# Patient Record
Sex: Female | Born: 1935 | Race: White | Hispanic: No | Smoking: Never smoker
Health system: Southern US, Community
[De-identification: ages and names within clinical notes are randomized; demographics above are authoritative.]

## PROBLEM LIST (undated history)

## (undated) DIAGNOSIS — R0602 Shortness of breath: Secondary | ICD-10-CM

## (undated) DIAGNOSIS — I10 Essential (primary) hypertension: Secondary | ICD-10-CM

## (undated) DIAGNOSIS — R599 Enlarged lymph nodes, unspecified: Secondary | ICD-10-CM

## (undated) DIAGNOSIS — D72821 Monocytosis (symptomatic): Secondary | ICD-10-CM

## (undated) DIAGNOSIS — R011 Cardiac murmur, unspecified: Secondary | ICD-10-CM

## (undated) DIAGNOSIS — D696 Thrombocytopenia, unspecified: Secondary | ICD-10-CM

## (undated) DIAGNOSIS — S62102A Fracture of unspecified carpal bone, left wrist, initial encounter for closed fracture: Secondary | ICD-10-CM

## (undated) HISTORY — PX: HEMORRHOID SURGERY: SHX153

## (undated) HISTORY — PX: BUNIONECTOMY: SHX129

## (undated) HISTORY — DX: Fracture of unspecified carpal bone, left wrist, initial encounter for closed fracture: S62.102A

## (undated) HISTORY — DX: Essential (primary) hypertension: I10

## (undated) HISTORY — DX: Monocytosis (symptomatic): D72.821

## (undated) HISTORY — DX: Thrombocytopenia, unspecified: D69.6

## (undated) HISTORY — PX: SOFT TISSUE CYST EXCISION: SHX2418

## (undated) HISTORY — DX: Cardiac murmur, unspecified: R01.1

## (undated) HISTORY — DX: Enlarged lymph nodes, unspecified: R59.9

---

## 2002-04-14 ENCOUNTER — Ambulatory Visit (HOSPITAL_COMMUNITY): Admission: RE | Admit: 2002-04-14 | Discharge: 2002-04-14 | Payer: Self-pay | Admitting: Gastroenterology

## 2002-05-25 ENCOUNTER — Other Ambulatory Visit: Admission: RE | Admit: 2002-05-25 | Discharge: 2002-05-25 | Payer: Self-pay | Admitting: Geriatric Medicine

## 2003-02-05 ENCOUNTER — Encounter: Payer: Self-pay | Admitting: Emergency Medicine

## 2003-02-05 ENCOUNTER — Emergency Department (HOSPITAL_COMMUNITY): Admission: EM | Admit: 2003-02-05 | Discharge: 2003-02-05 | Payer: Self-pay

## 2006-07-08 ENCOUNTER — Encounter: Admission: RE | Admit: 2006-07-08 | Discharge: 2006-07-08 | Payer: Self-pay | Admitting: Orthopedic Surgery

## 2006-07-22 ENCOUNTER — Encounter: Admission: RE | Admit: 2006-07-22 | Discharge: 2006-07-22 | Payer: Self-pay | Admitting: Orthopedic Surgery

## 2007-04-30 ENCOUNTER — Emergency Department (HOSPITAL_COMMUNITY): Admission: EM | Admit: 2007-04-30 | Discharge: 2007-04-30 | Payer: Self-pay | Admitting: Emergency Medicine

## 2007-05-02 ENCOUNTER — Encounter: Admission: RE | Admit: 2007-05-02 | Discharge: 2007-05-02 | Payer: Self-pay | Admitting: Orthopedic Surgery

## 2008-05-28 DIAGNOSIS — D696 Thrombocytopenia, unspecified: Secondary | ICD-10-CM

## 2008-05-28 HISTORY — DX: Thrombocytopenia, unspecified: D69.6

## 2008-11-16 ENCOUNTER — Ambulatory Visit: Payer: Self-pay | Admitting: Oncology

## 2008-12-09 LAB — CBC WITH DIFFERENTIAL/PLATELET
BASO%: 0.7 % (ref 0.0–2.0)
LYMPH%: 28 % (ref 14.0–49.7)
NEUT%: 57 % (ref 38.4–76.8)
RDW: 13.9 % (ref 11.2–14.5)

## 2009-01-13 ENCOUNTER — Ambulatory Visit: Payer: Self-pay | Admitting: Oncology

## 2009-01-17 LAB — CBC WITH DIFFERENTIAL/PLATELET
BASO%: 0.3 % (ref 0.0–2.0)
EOS%: 0.1 % (ref 0.0–7.0)
Eosinophils Absolute: 0 10*3/uL (ref 0.0–0.5)
HCT: 38.6 % (ref 34.8–46.6)
HGB: 12.6 g/dL (ref 11.6–15.9)
MCH: 29 pg (ref 25.1–34.0)
MCHC: 32.6 g/dL (ref 31.5–36.0)
MCV: 88.9 fL (ref 79.5–101.0)
NEUT#: 5.6 10*3/uL (ref 1.5–6.5)
WBC: 7.3 10*3/uL (ref 3.9–10.3)
nRBC: 0 % (ref 0–0)

## 2009-05-05 ENCOUNTER — Ambulatory Visit: Payer: Self-pay | Admitting: Oncology

## 2009-06-02 ENCOUNTER — Ambulatory Visit: Payer: Self-pay | Admitting: Oncology

## 2009-06-10 LAB — CBC WITH DIFFERENTIAL/PLATELET
Basophils Absolute: 0.1 10*3/uL (ref 0.0–0.1)
EOS%: 1.1 % (ref 0.0–7.0)
Eosinophils Absolute: 0.1 10*3/uL (ref 0.0–0.5)
HCT: 39.7 % (ref 34.8–46.6)
LYMPH%: 43 % (ref 14.0–49.7)
MONO%: 14.7 % — ABNORMAL HIGH (ref 0.0–14.0)
NEUT%: 40.1 % (ref 38.4–76.8)

## 2009-10-06 ENCOUNTER — Ambulatory Visit: Payer: Self-pay | Admitting: Oncology

## 2009-10-27 LAB — CBC WITH DIFFERENTIAL/PLATELET
Basophils Absolute: 0 10*3/uL (ref 0.0–0.1)
Eosinophils Absolute: 0.1 10*3/uL (ref 0.0–0.5)
HCT: 36.8 % (ref 34.8–46.6)
HGB: 12.6 g/dL (ref 11.6–15.9)
LYMPH%: 41.9 % (ref 14.0–49.7)
MCH: 30.4 pg (ref 25.1–34.0)
Platelets: 76 10*3/uL — ABNORMAL LOW (ref 145–400)
RBC: 4.15 10*6/uL (ref 3.70–5.45)
WBC: 6.3 10*3/uL (ref 3.9–10.3)

## 2010-02-24 ENCOUNTER — Ambulatory Visit: Payer: Self-pay | Admitting: Oncology

## 2010-02-28 LAB — CBC WITH DIFFERENTIAL/PLATELET
Basophils Absolute: 0.1 10*3/uL (ref 0.0–0.1)
EOS%: 0.5 % (ref 0.0–7.0)
HGB: 12.4 g/dL (ref 11.6–15.9)
MCHC: 32.5 g/dL (ref 31.5–36.0)
MONO%: 12.4 % (ref 0.0–14.0)
Platelets: 70 10*3/uL — ABNORMAL LOW (ref 145–400)
WBC: 6.3 10*3/uL (ref 3.9–10.3)
nRBC: 0 % (ref 0–0)

## 2010-03-10 LAB — FECAL OCCULT BLOOD, GUAIAC: Occult Blood: POSITIVE

## 2010-09-25 ENCOUNTER — Other Ambulatory Visit: Payer: Self-pay | Admitting: Oncology

## 2010-09-25 ENCOUNTER — Encounter (HOSPITAL_BASED_OUTPATIENT_CLINIC_OR_DEPARTMENT_OTHER): Payer: Medicare PPO | Admitting: Oncology

## 2010-09-25 DIAGNOSIS — D72821 Monocytosis (symptomatic): Secondary | ICD-10-CM

## 2010-09-25 DIAGNOSIS — K625 Hemorrhage of anus and rectum: Secondary | ICD-10-CM

## 2010-09-25 DIAGNOSIS — D696 Thrombocytopenia, unspecified: Secondary | ICD-10-CM

## 2010-09-25 DIAGNOSIS — I10 Essential (primary) hypertension: Secondary | ICD-10-CM

## 2010-09-25 LAB — CBC WITH DIFFERENTIAL/PLATELET
EOS%: 1.1 % (ref 0.0–7.0)
HGB: 12.3 g/dL (ref 11.6–15.9)
MONO%: 13.8 % (ref 0.0–14.0)
RBC: 4.23 10*6/uL (ref 3.70–5.45)
WBC: 5.5 10*3/uL (ref 3.9–10.3)

## 2010-10-13 NOTE — Op Note (Signed)
   NAME:  Ashley Lutz, Ashley Lutz                     ACCOUNT NO.:  [id number]   MEDICAL RECORD NO.:  [id number]                   PATIENT TYPE:  AMB   LOCATION:  ENDO                                 FACILITY:  [facility]   PHYSICIAN:  Danise Edge, M.D.                DATE OF BIRTH:  1919-04-29   DATE OF PROCEDURE:  DATE OF DISCHARGE:                                 OPERATIVE REPORT   PROCEDURE:  Screening colonoscopy.   INDICATIONS:  The patient is an 77 year old female born March 25, 1919. In  1992 the patient underwent a colonoscopy performed by Dr. Sheryn Bison  and colon polyps were removed. Her surveillance colonoscopy in 1995 was  normal. The patient is scheduled to undergo her third colonoscopy with  polypectomy to prevent colon cancer.   I discussed the complications associated with colonoscopy and polypectomy,  including a 15 per thousand risk of  bleeding and 1 per thousand risk of  colon perforation. The patient has signed the operative permit.   ENDOSCOPIST:  Danise Edge, M.D.   PREMEDICATION:  Versed 5 mg, fentanyl 50 mcg.   ENDOSCOPE:  Olympus pediatric colonoscope.   DESCRIPTION OF PROCEDURE:  After informed consent was obtained the patient  was placed in the left lateral decubitus position. I administered  intravenous fentanyl and intravenous Versed to achieve conscious sedation  for the procedure. The patient's blood pressure, oxygen saturation and  cardiac rhythm were monitored throughout the procedure and documented in the  medical record.   Anal inspection was normal. Digital rectal exam was normal. The Olympus  pediatric video colonoscope was introduced into the rectum and advanced to  the cecum. Colonic preparation for  the examination today was excellent.   Rectum:  Normal.   Sigmoid colon and descending colon:  Left colonic diverticulosis.   Splenic flexure:  Normal.   Transverse colon:  Normal.   Hepatic flexure:  Normal.   Ascending colon:   Scattered  diverticula.   Cecum and ileocecal valve:  Normal.    ASSESSMENT:  Colonic diverticulosis involving the right colon and left  colon; otherwise normal procto colonoscopy to the cecum. No endoscopic  evidence for the presence of colorectal neoplasia.                                                 Danise Edge, M.D.    MJ/MEDQ  D:  04/14/2002  T:  04/14/2002  Job:  [id number]   cc:   Hal T. Stoneking, M.D.  [address] [place], [place] [address]  Fax: [phone]

## 2010-12-25 ENCOUNTER — Encounter (HOSPITAL_BASED_OUTPATIENT_CLINIC_OR_DEPARTMENT_OTHER): Payer: Medicare PPO | Admitting: Oncology

## 2010-12-25 ENCOUNTER — Other Ambulatory Visit: Payer: Self-pay | Admitting: Oncology

## 2010-12-25 DIAGNOSIS — D696 Thrombocytopenia, unspecified: Secondary | ICD-10-CM

## 2010-12-25 DIAGNOSIS — D72821 Monocytosis (symptomatic): Secondary | ICD-10-CM

## 2010-12-25 DIAGNOSIS — K625 Hemorrhage of anus and rectum: Secondary | ICD-10-CM

## 2010-12-25 LAB — CBC WITH DIFFERENTIAL/PLATELET
Basophils Absolute: 0.1 10*3/uL (ref 0.0–0.1)
EOS%: 1.5 % (ref 0.0–7.0)
Eosinophils Absolute: 0.1 10*3/uL (ref 0.0–0.5)
HCT: 37.2 % (ref 34.8–46.6)
LYMPH%: 39.6 % (ref 14.0–49.7)
MCH: 28.6 pg (ref 25.1–34.0)
MONO#: 0.7 10*3/uL (ref 0.1–0.9)
MONO%: 15.1 % — ABNORMAL HIGH (ref 0.0–14.0)
NEUT%: 42.6 % (ref 38.4–76.8)
Platelets: 49 10*3/uL — ABNORMAL LOW (ref 145–400)
RDW: 15.6 % — ABNORMAL HIGH (ref 11.2–14.5)
lymph#: 1.9 10*3/uL (ref 0.9–3.3)
nRBC: 0 % (ref 0–0)

## 2011-02-19 ENCOUNTER — Other Ambulatory Visit: Payer: Self-pay | Admitting: Oncology

## 2011-02-19 ENCOUNTER — Encounter (HOSPITAL_BASED_OUTPATIENT_CLINIC_OR_DEPARTMENT_OTHER): Payer: Medicare PPO | Admitting: Oncology

## 2011-02-19 DIAGNOSIS — D72821 Monocytosis (symptomatic): Secondary | ICD-10-CM

## 2011-02-19 DIAGNOSIS — K625 Hemorrhage of anus and rectum: Secondary | ICD-10-CM

## 2011-02-19 DIAGNOSIS — D696 Thrombocytopenia, unspecified: Secondary | ICD-10-CM

## 2011-02-19 LAB — CBC WITH DIFFERENTIAL/PLATELET
BASO%: 1.1 % (ref 0.0–2.0)
Basophils Absolute: 0.1 10*3/uL (ref 0.0–0.1)
EOS%: 0.3 % (ref 0.0–7.0)
Eosinophils Absolute: 0 10*3/uL (ref 0.0–0.5)
HGB: 11.8 g/dL (ref 11.6–15.9)
MCH: 28.6 pg (ref 25.1–34.0)
MCV: 88.4 fL (ref 79.5–101.0)
MONO%: 20.3 % — ABNORMAL HIGH (ref 0.0–14.0)
NEUT#: 2.9 10*3/uL (ref 1.5–6.5)
Platelets: 49 10*3/uL — ABNORMAL LOW (ref 145–400)
RDW: 16.1 % — ABNORMAL HIGH (ref 11.2–14.5)

## 2011-02-19 LAB — CHCC SMEAR

## 2011-04-23 ENCOUNTER — Other Ambulatory Visit (HOSPITAL_BASED_OUTPATIENT_CLINIC_OR_DEPARTMENT_OTHER): Payer: Medicare PPO | Admitting: Lab

## 2011-04-23 ENCOUNTER — Other Ambulatory Visit: Payer: Self-pay | Admitting: Oncology

## 2011-04-23 DIAGNOSIS — K625 Hemorrhage of anus and rectum: Secondary | ICD-10-CM

## 2011-04-23 DIAGNOSIS — D696 Thrombocytopenia, unspecified: Secondary | ICD-10-CM

## 2011-04-23 DIAGNOSIS — D72821 Monocytosis (symptomatic): Secondary | ICD-10-CM

## 2011-04-23 LAB — CBC WITH DIFFERENTIAL/PLATELET
BASO%: 0.4 % (ref 0.0–2.0)
Basophils Absolute: 0 10*3/uL (ref 0.0–0.1)
LYMPH%: 28.6 % (ref 14.0–49.7)
MCH: 29.3 pg (ref 25.1–34.0)
MCV: 89.2 fL (ref 79.5–101.0)
NEUT#: 4.2 10*3/uL (ref 1.5–6.5)
Platelets: 55 10*3/uL — ABNORMAL LOW (ref 145–400)
RBC: 4.04 10*6/uL (ref 3.70–5.45)

## 2011-05-24 ENCOUNTER — Telehealth: Payer: Self-pay | Admitting: Oncology

## 2011-05-24 NOTE — Telephone Encounter (Signed)
called pt with 07/02/11 appt,aware per pof from 02/19/11

## 2011-06-26 ENCOUNTER — Other Ambulatory Visit: Payer: Self-pay | Admitting: *Deleted

## 2011-06-26 DIAGNOSIS — D649 Anemia, unspecified: Secondary | ICD-10-CM

## 2011-06-27 ENCOUNTER — Telehealth: Payer: Self-pay | Admitting: Oncology

## 2011-06-27 NOTE — Telephone Encounter (Signed)
called pts home lmovm for appts on 01/31 and 02/04 and to rtn call to confirm appt d/t

## 2011-06-27 NOTE — Telephone Encounter (Signed)
pt called and stated that she does not want to come in on 02/04 but she wants to keep her appt for 02/18

## 2011-06-28 ENCOUNTER — Other Ambulatory Visit: Payer: Medicare PPO | Admitting: Lab

## 2011-07-02 ENCOUNTER — Other Ambulatory Visit: Payer: Medicare PPO | Admitting: Lab

## 2011-07-02 ENCOUNTER — Ambulatory Visit: Payer: Medicare PPO | Admitting: Oncology

## 2011-07-02 ENCOUNTER — Ambulatory Visit: Payer: Medicare PPO | Admitting: Nurse Practitioner

## 2011-07-11 ENCOUNTER — Ambulatory Visit: Payer: Medicare PPO | Admitting: Nurse Practitioner

## 2011-07-13 ENCOUNTER — Encounter: Payer: Self-pay | Admitting: *Deleted

## 2011-07-16 ENCOUNTER — Ambulatory Visit (HOSPITAL_BASED_OUTPATIENT_CLINIC_OR_DEPARTMENT_OTHER): Payer: Medicare Other | Admitting: Oncology

## 2011-07-16 ENCOUNTER — Other Ambulatory Visit: Payer: Medicare Other | Admitting: Lab

## 2011-07-16 VITALS — BP 173/87 | HR 101 | Temp 97.7°F | Ht 61.5 in | Wt 130.2 lb

## 2011-07-16 DIAGNOSIS — D696 Thrombocytopenia, unspecified: Secondary | ICD-10-CM

## 2011-07-16 DIAGNOSIS — D649 Anemia, unspecified: Secondary | ICD-10-CM

## 2011-07-16 LAB — CHCC SMEAR

## 2011-07-16 LAB — CBC WITH DIFFERENTIAL/PLATELET
BASO%: 0.6 % (ref 0.0–2.0)
HCT: 35.3 % (ref 34.8–46.6)
HGB: 11.9 g/dL (ref 11.6–15.9)
LYMPH%: 42.5 % (ref 14.0–49.7)
MCHC: 33.7 g/dL (ref 31.5–36.0)
MCV: 91 fL (ref 79.5–101.0)
NEUT#: 2 10*3/uL (ref 1.5–6.5)
Platelets: 41 10*3/uL — ABNORMAL LOW (ref 145–400)
WBC: 4.5 10*3/uL (ref 3.9–10.3)
lymph#: 1.9 10*3/uL (ref 0.9–3.3)

## 2011-07-16 NOTE — Progress Notes (Signed)
OFFICE PROGRESS NOTE   INTERVAL HISTORY:   She saw Dr. Pete Glatter on 06/18/2011 and the platelet count returned low at 31,000. She is referred for a repeat hematology evaluation. She denies spontaneous bleeding and bruising. She feels well. She denies fever and night sweats.  She recently fell when getting out of her chair at home and developed a "bruise "at the right gluteus and iliac. This has improved.  Objective:  Vital signs in last 24 hours:  Blood pressure 173/87, pulse 101, temperature 97.7 F (36.5 C), temperature source Oral, height 5' 1.5" (1.562 m), weight 130 lb 3.2 oz (59.058 kg).    HEENT: Neck without mass Lymphatics: No cervical, supraclavicular, or inguinal nodes. "Shoddy "bilateral axillary nodes versus prominent fat pads. Resp: Lungs clear bilaterally Cardio: Regular rate and rhythm, 2/6 systolic murmur GI: No hepatosplenomegaly Vascular: No leg edema  Skin: Resolving ecchymosis at the right iliac and upper gluteus.   Lab Results:  Lab Results  Component Value Date   WBC 4.5 07/16/2011   HGB 11.9 07/16/2011   HCT 35.3 07/16/2011   MCV 91.0 07/16/2011   PLT 41* 07/16/2011   ANC 2.0, absolute lymphocyte count 1.9 Absolute monocyte count 0.5   Medications: I have reviewed the patient's current medications.  Assessment/Plan: 1. Thrombocytopenia, question chronic idiopathic thrombocytopenic purpura.  The platelet count was lower at Dr. Laverle Hobby last month and is slightly improved today to. 2. History of small bilateral axillary lymph nodes. 3. Hypertension. 4. G2 P2. 5. History of a chronic mild monocytosis.  The monocyte count is normal today. 6.     Recent fall with a resolving ecchymosis at the right iliac/gluteal region  Disposition:  She has persistent thrombocytopenia. The thrombocytopenia is most likely related to chronic ITP. The platelet count is lower compared to a few years ago. I generally do not recommend treatment of ITP unless the platelet  count falls to less than 25,000 or there is bleeding/need for a surgical procedure. We decided to continue an observation approach for now. I will review the peripheral blood smears from today. She will return for a platelet count in one month. We scheduled a 3 month office visit. She knows to contact us in the interim for spontaneous bleeding or bruising .   Lucile Shutters, MD  07/16/2011  4:11 PM

## 2011-07-17 ENCOUNTER — Telehealth: Payer: Self-pay | Admitting: Oncology

## 2011-07-17 NOTE — Telephone Encounter (Signed)
called pts home lmovm about for march-may2013.  asked pt to call to confirm appt

## 2011-07-18 ENCOUNTER — Telehealth: Payer: Self-pay | Admitting: Oncology

## 2011-07-18 NOTE — Telephone Encounter (Signed)
pt called and r/s lab appt on 03/19 to 03/18

## 2011-08-13 ENCOUNTER — Other Ambulatory Visit (HOSPITAL_BASED_OUTPATIENT_CLINIC_OR_DEPARTMENT_OTHER): Payer: Medicare Other | Admitting: Lab

## 2011-08-13 DIAGNOSIS — D696 Thrombocytopenia, unspecified: Secondary | ICD-10-CM

## 2011-08-13 LAB — CBC WITH DIFFERENTIAL/PLATELET
BASO%: 1.7 % (ref 0.0–2.0)
LYMPH%: 45.9 % (ref 14.0–49.7)
NEUT#: 1.8 10*3/uL (ref 1.5–6.5)
RBC: 4.25 10*6/uL (ref 3.70–5.45)

## 2011-08-14 ENCOUNTER — Other Ambulatory Visit: Payer: Medicare Other | Admitting: Lab

## 2011-08-21 ENCOUNTER — Telehealth: Payer: Self-pay | Admitting: *Deleted

## 2011-08-21 NOTE — Telephone Encounter (Signed)
Patient notified of CBC results. Follow up as scheduled in May. Copy mailed to home as requested.

## 2011-08-21 NOTE — Telephone Encounter (Signed)
Message copied by Wandalee Ferdinand on Tue Aug 21, 2011  5:10 PM ------      Message from: Ashley Lutz      Created: Thu Aug 16, 2011  9:07 PM       Please call patient, platelets are stable, f/u cbc and office  4-6 weeks

## 2011-09-11 ENCOUNTER — Telehealth: Payer: Self-pay | Admitting: *Deleted

## 2011-09-11 NOTE — Telephone Encounter (Signed)
Patient called to request appointment to see Dr. Truett Perna regarding change in her labs. Was instructed to call our office per [facility]. Received CBC results from 4/15 from [facility]--stable except drop in platelets to 25 (was 41 08/13/11). Note attached reports "black stool".

## 2011-09-11 NOTE — Telephone Encounter (Signed)
Attempted to call patient to discuss her work-up with [facility] and how she is feeling. No answer. Forwarded labs to Dr. Truett Perna.

## 2011-09-12 ENCOUNTER — Other Ambulatory Visit: Payer: Self-pay | Admitting: *Deleted

## 2011-09-12 ENCOUNTER — Telehealth: Payer: Self-pay | Admitting: *Deleted

## 2011-09-12 DIAGNOSIS — D696 Thrombocytopenia, unspecified: Secondary | ICD-10-CM

## 2011-09-12 NOTE — Telephone Encounter (Signed)
Called pt, appts given. She verbalized understanding.

## 2011-09-13 ENCOUNTER — Other Ambulatory Visit (HOSPITAL_BASED_OUTPATIENT_CLINIC_OR_DEPARTMENT_OTHER): Payer: Medicare Other | Admitting: Lab

## 2011-09-13 ENCOUNTER — Ambulatory Visit (HOSPITAL_BASED_OUTPATIENT_CLINIC_OR_DEPARTMENT_OTHER): Payer: Medicare Other | Admitting: Nurse Practitioner

## 2011-09-13 VITALS — BP 152/80 | HR 99 | Temp 99.2°F | Ht 61.5 in | Wt 129.1 lb

## 2011-09-13 DIAGNOSIS — D696 Thrombocytopenia, unspecified: Secondary | ICD-10-CM

## 2011-09-13 DIAGNOSIS — D693 Immune thrombocytopenic purpura: Secondary | ICD-10-CM

## 2011-09-13 LAB — CBC WITH DIFFERENTIAL/PLATELET
Basophils Absolute: 0.1 10*3/uL (ref 0.0–0.1)
EOS%: 0.6 % (ref 0.0–7.0)
MCH: 30 pg (ref 25.1–34.0)
MCHC: 32.8 g/dL (ref 31.5–36.0)
MCV: 91.5 fL (ref 79.5–101.0)
MONO%: 10.8 % (ref 0.0–14.0)
NEUT%: 50.9 % (ref 38.4–76.8)
WBC: 7.4 10*3/uL (ref 3.9–10.3)
lymph#: 2.7 10*3/uL (ref 0.9–3.3)
nRBC: 0 % (ref 0–0)

## 2011-09-13 LAB — CHCC SMEAR

## 2011-09-13 NOTE — Progress Notes (Signed)
OFFICE PROGRESS NOTE  Interval history:  [name] is a 78 year old woman with chronic thrombocytopenia. She contacted the office earlier this week to request an appointment due to a decrease in her platelet count at an outside lab. We received a CBC on 09/10/2011 which was stable except the platelet count was 25,000. Last labs done in our office on 08/13/2011 showed a platelet count of 41,000.  [name] reports that she feels well. No interim illnesses or infections. She occasionally notes a very small amount of blood on the toilet tissue after a bowel movement. She attributes the blood to "hemorrhoids". She denies melena. No epistaxis or gingival bleeding. No hematuria.   Objective: Blood pressure 152/80, pulse 99, temperature 99.2 F (37.3 C), temperature source Oral, height 5' 1.5" (1.562 m), weight 129 lb 1.6 oz (58.559 kg).  Oropharynx is without thrush or ulceration. No palpable cervical, supraclavicular or axillary lymph nodes. Lungs are clear. Regular cardiac rhythm. 2-3/6 systolic murmur. Abdomen is soft and nontender. No organomegaly. Extremities without edema.  Lab Results: Lab Results  Component Value Date   WBC 7.4 09/13/2011   HGB 12.1 09/13/2011   HCT 36.8 09/13/2011   MCV 91.5 09/13/2011   PLT 37* 09/13/2011    Chemistry:    Chemistry   No results found for this basename: NA, K, CL, CO2, BUN, CREATININE, GLU   No results found for this basename: CALCIUM, ALKPHOS, AST, ALT, BILITOT       Studies/Results: No results found.  Medications: I have reviewed the patient's current medications.  Assessment/Plan:  1. Thrombocytopenia, question chronic ITP. The platelet count was lower than recent baseline at an outside lab on 09/10/2011. The platelet count today is slightly improved. 2. History of small bilateral axillary lymph nodes. 3. Hypertension. 4. G2 P2. 5. History of a chronic mild monocytosis. The monocyte count is normal today.  Disposition-Ms.  Ashley Lutz has persistent thrombocytopenia. The platelet count in our office today is improved as compared to the recent outside labs. We will continue to follow on an observation approach for now. Dr. Truett Perna will review the peripheral blood smear from today. [name] will return for a followup visit and labs in 6 weeks. She will contact the office in the interim with spontaneous bruising/bleeding.  Plan reviewed with Dr. Truett Perna.   Lonna Cobb ANP/GNP-BC

## 2011-09-18 ENCOUNTER — Telehealth: Payer: Self-pay | Admitting: Oncology

## 2011-09-18 NOTE — Telephone Encounter (Signed)
called pt lmovm that her appt on 05/20 was moved to 05/30

## 2011-10-15 ENCOUNTER — Ambulatory Visit: Payer: Medicare Other | Admitting: Nurse Practitioner

## 2011-10-15 ENCOUNTER — Other Ambulatory Visit: Payer: Medicare Other | Admitting: Lab

## 2011-10-18 DIAGNOSIS — D696 Thrombocytopenia, unspecified: Secondary | ICD-10-CM | POA: Insufficient documentation

## 2011-10-25 ENCOUNTER — Telehealth: Payer: Self-pay | Admitting: Oncology

## 2011-10-25 ENCOUNTER — Ambulatory Visit (HOSPITAL_BASED_OUTPATIENT_CLINIC_OR_DEPARTMENT_OTHER): Payer: Medicare Other | Admitting: Nurse Practitioner

## 2011-10-25 ENCOUNTER — Other Ambulatory Visit (HOSPITAL_BASED_OUTPATIENT_CLINIC_OR_DEPARTMENT_OTHER): Payer: Medicare Other | Admitting: Lab

## 2011-10-25 VITALS — BP 139/77 | HR 91 | Temp 97.7°F | Ht 61.5 in | Wt 130.3 lb

## 2011-10-25 DIAGNOSIS — D693 Immune thrombocytopenic purpura: Secondary | ICD-10-CM

## 2011-10-25 DIAGNOSIS — R011 Cardiac murmur, unspecified: Secondary | ICD-10-CM

## 2011-10-25 DIAGNOSIS — R233 Spontaneous ecchymoses: Secondary | ICD-10-CM

## 2011-10-25 DIAGNOSIS — I10 Essential (primary) hypertension: Secondary | ICD-10-CM

## 2011-10-25 DIAGNOSIS — D696 Thrombocytopenia, unspecified: Secondary | ICD-10-CM

## 2011-10-25 LAB — CBC WITH DIFFERENTIAL/PLATELET
BASO%: 0.9 % (ref 0.0–2.0)
EOS%: 0.9 % (ref 0.0–7.0)
HCT: 36.7 % (ref 34.8–46.6)
LYMPH%: 34.8 % (ref 14.0–49.7)
MONO#: 0.9 10*3/uL (ref 0.1–0.9)
NEUT#: 2.9 10*3/uL (ref 1.5–6.5)
RBC: 4.02 10*6/uL (ref 3.70–5.45)
RDW: 16.5 % — ABNORMAL HIGH (ref 11.2–14.5)
WBC: 5.9 10*3/uL (ref 3.9–10.3)
lymph#: 2.1 10*3/uL (ref 0.9–3.3)
nRBC: 0 % (ref 0–0)

## 2011-10-25 NOTE — Progress Notes (Signed)
OFFICE PROGRESS NOTE  Interval history:  Ashley Lutz returns as scheduled. She overall feels well. No interim illnesses or infections. She denies bleeding except related to "hemorrhoids". She continues to note easy bruising.   Objective: Blood pressure 139/77, pulse 91, temperature 97.7 F (36.5 C), temperature source Oral, height 5' 1.5" (1.562 m), weight 130 lb 4.8 oz (59.104 kg).  Oropharynx is without thrush or ulceration. Lungs clear. Regular cardiac rhythm. 2-3/6 systolic murmur. Abdomen soft and nontender. No splenomegaly. Extremities without edema. Petechiae lower legs.  Lab Results: Lab Results  Component Value Date   WBC 5.9 10/25/2011   HGB 12.1 10/25/2011   HCT 36.7 10/25/2011   MCV 91.3 10/25/2011   PLT 40* 10/25/2011    Chemistry:    Chemistry   No results found for this basename: NA, K, CL, CO2, BUN, CREATININE, GLU   No results found for this basename: CALCIUM, ALKPHOS, AST, ALT, BILITOT       Studies/Results: No results found.  Medications: I have reviewed the patient's current medications.  Assessment/Plan:  1. Thrombocytopenia, question chronic ITP. The platelet count is stable. 2. History of small bilateral axillary lymph nodes. 3. Hypertension. 4. G2 P2. 5. History of a chronic mild monocytosis.   Disposition-Ms. [place] platelet count remains stable. She will return for labs in 6 weeks and a followup visit in 3 months. She will contact the office in the interim with any problems.  Plan reviewed with Dr. Truett Perna.  Lonna Cobb ANP/GNP-BC

## 2011-10-25 NOTE — Telephone Encounter (Signed)
appts made and printed for pt aom °

## 2012-01-24 ENCOUNTER — Other Ambulatory Visit (HOSPITAL_BASED_OUTPATIENT_CLINIC_OR_DEPARTMENT_OTHER): Payer: Medicare Other | Admitting: Lab

## 2012-01-24 ENCOUNTER — Ambulatory Visit (HOSPITAL_BASED_OUTPATIENT_CLINIC_OR_DEPARTMENT_OTHER): Payer: Medicare Other | Admitting: Nurse Practitioner

## 2012-01-24 ENCOUNTER — Telehealth: Payer: Self-pay | Admitting: Oncology

## 2012-01-24 VITALS — BP 140/71 | HR 93 | Temp 98.0°F | Resp 22 | Ht 61.5 in | Wt 130.7 lb

## 2012-01-24 DIAGNOSIS — D696 Thrombocytopenia, unspecified: Secondary | ICD-10-CM

## 2012-01-24 LAB — CBC WITH DIFFERENTIAL/PLATELET
HCT: 36.1 % (ref 34.8–46.6)
MONO#: 0.6 10*3/uL (ref 0.1–0.9)
MONO%: 11.7 % (ref 0.0–14.0)
Platelets: 31 10*3/uL — ABNORMAL LOW (ref 145–400)
RBC: 4.03 10*6/uL (ref 3.70–5.45)
lymph#: 2.1 10*3/uL (ref 0.9–3.3)
nRBC: 0 % (ref 0–0)

## 2012-01-24 LAB — TECHNOLOGIST REVIEW

## 2012-01-24 NOTE — Telephone Encounter (Signed)
gve the pt her oct,dec 2013 appt calendar °

## 2012-01-24 NOTE — Progress Notes (Signed)
OFFICE PROGRESS NOTE  Interval history:  [name] returns as scheduled. No interim illnesses or infections. She denies spontaneous bruising/bleeding. She reports stable dyspnea on exertion. She has an occasional cough. No fever.   Objective: Blood pressure 140/71, pulse 93, temperature 98 F (36.7 C), temperature source Oral, resp. rate 22, height 5' 1.5" (1.562 m), weight 130 lb 11.2 oz (59.285 kg).  Oropharynx is without thrush or ulceration. Lungs are clear. Regular cardiac rhythm. 3 of her 6 systolic murmur. No jugular vein distention. Abdomen soft and nontender. No splenomegaly. Extremities without edema.  Lab Results: Lab Results  Component Value Date   WBC 5.1 01/24/2012   HGB 11.9 01/24/2012   HCT 36.1 01/24/2012   MCV 89.6 01/24/2012   PLT 31* 01/24/2012    Chemistry:    Chemistry   No results found for this basename: NA, K, CL, CO2, BUN, CREATININE, GLU   No results found for this basename: CALCIUM, ALKPHOS, AST, ALT, BILITOT       Studies/Results: No results found.  Medications: I have reviewed the patient's current medications.  Assessment/Plan:  1. Thrombocytopenia, question chronic ITP. The platelet count is stable. 2. History of small bilateral axillary lymph nodes. 3. Hypertension. 4. G2 P2. 5. History of a chronic mild monocytosis.   Disposition-Ashley Lutz's platelet count is slightly lower. She will contact the office with spontaneous bruising/bleeding. She will return for a followup CBC in 8 weeks and a followup visit in 4 months.  Plan reviewed with Dr. Truett Perna.  Lonna Cobb ANP/GNP-BC

## 2012-01-30 ENCOUNTER — Telehealth: Payer: Self-pay | Admitting: *Deleted

## 2012-01-30 NOTE — Telephone Encounter (Signed)
Received call from pt son requesting to speak with RN, wanting pt to be seen.  Attempted to call son [phone]) left voice message on known voice mail that if pt continues to notice spots on arms to go to the ED to get blood checked.  Attempted to call pt home #, no answer/no answering machine.

## 2012-01-30 NOTE — Telephone Encounter (Signed)
Received call from pt stating she had "spots on my right arm and some on my left, they told me to call if I see any"  Pt saw Lonna Cobb, NP 01/24/12, PLTC 31 at that time.  Attempted to call pt and left voice message to return call to this office.

## 2012-01-31 ENCOUNTER — Telehealth: Payer: Self-pay | Admitting: *Deleted

## 2012-01-31 ENCOUNTER — Ambulatory Visit (HOSPITAL_BASED_OUTPATIENT_CLINIC_OR_DEPARTMENT_OTHER): Payer: Medicare Other | Admitting: Lab

## 2012-01-31 DIAGNOSIS — D696 Thrombocytopenia, unspecified: Secondary | ICD-10-CM

## 2012-01-31 LAB — CBC WITH DIFFERENTIAL/PLATELET
BASO%: 1.1 % (ref 0.0–2.0)
EOS%: 1.1 % (ref 0.0–7.0)
HCT: 38.2 % (ref 34.8–46.6)
LYMPH%: 38.8 % (ref 14.0–49.7)
MCH: 30.2 pg (ref 25.1–34.0)
MCHC: 32.9 g/dL (ref 31.5–36.0)
MONO#: 0.7 10*3/uL (ref 0.1–0.9)
NEUT%: 44.8 % (ref 38.4–76.8)
RBC: 4.16 10*6/uL (ref 3.70–5.45)
WBC: 4.9 10*3/uL (ref 3.9–10.3)
lymph#: 1.9 10*3/uL (ref 0.9–3.3)

## 2012-01-31 NOTE — Telephone Encounter (Signed)
Call from pt reporting she has "small red dots" on her Right arm from the elbow to wrist. Asking if she should come in to see Dr. Truett Perna to have this evaluated. Pt states it does not appear to be a rash. No red spots on either leg. Pt denies bleeding. Reviewed with Dr. Truett Perna: order received for CBC today, called pt with instructions to come in for lab, wait for results. She voiced understanding.

## 2012-01-31 NOTE — Telephone Encounter (Signed)
Spoke with pt in lobby, PLT stable. She denies any bleeding. Several small red spots noted on her R forearm. 2 petechiae noted near lab draw site. No other bruising noted. Pt instructed to call office or go to ED if she noticed petechiae type spots all over her body, or if she has bleeding that doesn't stop. She and son voiced understanding. She will follow up as scheduled.

## 2012-02-06 ENCOUNTER — Emergency Department (HOSPITAL_COMMUNITY)
Admission: EM | Admit: 2012-02-06 | Discharge: 2012-02-06 | Disposition: A | Discharge status: Home / Self Care | Payer: Medicare Other | Attending: Emergency Medicine | Admitting: Emergency Medicine

## 2012-02-06 ENCOUNTER — Encounter (HOSPITAL_COMMUNITY): Payer: Self-pay | Admitting: Emergency Medicine

## 2012-02-06 ENCOUNTER — Telehealth: Payer: Self-pay | Admitting: *Deleted

## 2012-02-06 DIAGNOSIS — I10 Essential (primary) hypertension: Secondary | ICD-10-CM | POA: Insufficient documentation

## 2012-02-06 DIAGNOSIS — D696 Thrombocytopenia, unspecified: Secondary | ICD-10-CM

## 2012-02-06 DIAGNOSIS — Z91011 Allergy to milk products: Secondary | ICD-10-CM | POA: Insufficient documentation

## 2012-02-06 LAB — CBC
Hemoglobin: 11.3 g/dL — ABNORMAL LOW (ref 12.0–15.0)
MCHC: 32.9 g/dL (ref 30.0–36.0)
RDW: 16.3 % — ABNORMAL HIGH (ref 11.5–15.5)
WBC: 4 10*3/uL (ref 4.0–10.5)

## 2012-02-06 LAB — HEPATIC FUNCTION PANEL
ALT: 9 U/L (ref 0–35)
Alkaline Phosphatase: 54 U/L (ref 39–117)
Indirect Bilirubin: 0.3 mg/dL (ref 0.3–0.9)
Total Bilirubin: 0.4 mg/dL (ref 0.3–1.2)
Total Protein: 7.4 g/dL (ref 6.0–8.3)

## 2012-02-06 LAB — BASIC METABOLIC PANEL
Chloride: 104 mEq/L (ref 96–112)
GFR calc Af Amer: 87 mL/min — ABNORMAL LOW (ref >90)
GFR calc non Af Amer: 75 mL/min — ABNORMAL LOW (ref >90)
Potassium: 3.3 mEq/L — ABNORMAL LOW (ref 3.5–5.1)
Sodium: 137 mEq/L (ref 135–145)

## 2012-02-06 LAB — PROTIME-INR: INR: 1.14 (ref 0.00–1.49)

## 2012-02-06 NOTE — Telephone Encounter (Signed)
Received call from pt son stating "the spots on mom's arms are getting darker and I'm taking her to the ED and wanted office to be aware."  Platelets 28 on 01/31/12;  Note to Dr. Truett Perna.

## 2012-02-06 NOTE — ED Notes (Signed)
Pt has hx of low platelets and was told to come in and be checked out if she has dark spots on her skin. Pt has a few bruised areas on her arms. Pt states her head feels hot, but denies any other pain. Pt a/o x 4. Pt BIB son.

## 2012-02-06 NOTE — ED Provider Notes (Signed)
History     CSN: [id number]  Arrival date & time 02/06/12  1544   First MD Initiated Contact with Patient 02/06/12 1719      Chief Complaint  Patient presents with  . Bleeding/Bruising    (Consider location/radiation/quality/duration/timing/severity/associated sxs/prior treatment) HPI Comments: Pt comes in with cc of rash. Pt has hx of thrombocytopenia - idiopathic per records, not undergoing any treatments right now. She reports that over the past 2 days she has developed these diffuse spots all over her lower ans upper extremities. There has been no trauma, and no hx of similar lesions in the past. Pt has no recent trauma/falls, and has no nausea, vomiting, visual complains, seizures, altered mental status, loss of consciousness, new weakness, or numbness, no gait instability.  The history is provided by the patient.    Past Medical History  Diagnosis Date  . Thrombocytopenia 2010  . Hypertension   . Heart murmur   . Wrist fracture, left   . Chronic idiopathic monocytosis   . Enlargement of lymph nodes     H/O small bilateral axillary lymph nodes    Past Surgical History  Procedure Date  . Hemorrhoid surgery   . Bunionectomy   . Soft tissue cyst excision     X 5 occasions--cysts on scalp    No family history on file.  History  Substance Use Topics  . Smoking status: Not on file  . Smokeless tobacco: Not on file  . Alcohol Use: Not on file    OB History    Grav Para Term Preterm Abortions TAB SAB Ect Mult Living                  Review of Systems  Constitutional: Positive for activity change.  HENT: Negative for neck pain.   Respiratory: Negative for shortness of breath.   Cardiovascular: Negative for chest pain.  Gastrointestinal: Negative for nausea, vomiting and abdominal pain.  Genitourinary: Negative for dysuria.  Skin: Positive for rash.  Neurological: Negative for headaches.    Allergies  Milk-related compounds  Home Medications    Current Outpatient Rx  Name Route Sig Dispense Refill  . AMLODIPINE BESYLATE 5 MG PO TABS Oral Take 5 mg by mouth daily.    . CENTRUM SILVER PO Oral Take 1 tablet by mouth daily.    Marland Kitchen METAMUCIL PO Oral Take by mouth daily.    Marland Kitchen RAMIPRIL 10 MG PO CAPS Oral Take 10 mg by mouth daily.      BP 147/94  Pulse 84  Temp 97.2 F (36.2 C) (Oral)  Resp 22  SpO2 98%  Physical Exam  Nursing note and vitals reviewed. Constitutional: She is oriented to person, place, and time. She appears well-developed and well-nourished.  HENT:  Head: Normocephalic and atraumatic.  Eyes: EOM are normal. Pupils are equal, round, and reactive to light.  Neck: Neck supple.  Cardiovascular: Normal rate, regular rhythm and normal heart sounds.   No murmur heard. Pulmonary/Chest: Effort normal. No respiratory distress.  Abdominal: Soft. She exhibits no distension. There is no tenderness. There is no rebound and no guarding.  Neurological: She is alert and oriented to person, place, and time.  Skin: Skin is warm and dry.       Pt has diffuse macular lesions - yellow in color and non pruritic, non tender spread all over bilateral lower extremities and upper extremities. The lesions are very fine, and are not pronounced - blend well with rest of her skin.  ED Course  Procedures (including critical care time)  Labs Reviewed  CBC - Abnormal; Notable for the following:    RBC 3.79 (*)     Hemoglobin 11.3 (*)     HCT 34.3 (*)     RDW 16.3 (*)     All other components within normal limits  BASIC METABOLIC PANEL - Abnormal; Notable for the following:    Potassium 3.3 (*)     GFR calc non Af Amer 75 (*)     GFR calc Af Amer 87 (*)     All other components within normal limits  PROTIME-INR  HEPATIC FUNCTION PANEL   No results found.   1. Thrombocytopenia       MDM  Pt comes in with cc of rash. Has significant thrombocytopenia that is known and managed outpatient. The exam demonstrates a rash that  is not a purpura and not a classic petechiae. We will get CBC, LFTs - the latter to make sure there is no liver pathology - which can present as a rash. If labs are normal, will discharge patient.         Derwood Kaplan, MD 02/06/12 2037

## 2012-02-06 NOTE — ED Notes (Signed)
MD at bedside. 

## 2012-02-15 ENCOUNTER — Ambulatory Visit (HOSPITAL_BASED_OUTPATIENT_CLINIC_OR_DEPARTMENT_OTHER): Payer: Medicare Other | Admitting: Lab

## 2012-02-15 ENCOUNTER — Telehealth: Payer: Self-pay | Admitting: *Deleted

## 2012-02-15 ENCOUNTER — Other Ambulatory Visit: Payer: Medicare Other | Admitting: Lab

## 2012-02-15 DIAGNOSIS — D696 Thrombocytopenia, unspecified: Secondary | ICD-10-CM

## 2012-02-15 LAB — CBC WITH DIFFERENTIAL/PLATELET
BASO%: 0.9 % (ref 0.0–2.0)
EOS%: 0.8 % (ref 0.0–7.0)
HCT: 36.9 % (ref 34.8–46.6)
LYMPH%: 40.2 % (ref 14.0–49.7)
MCH: 30.3 pg (ref 25.1–34.0)
MCHC: 32.9 g/dL (ref 31.5–36.0)
NEUT%: 45.2 % (ref 38.4–76.8)
Platelets: 32 10*3/uL — ABNORMAL LOW (ref 145–400)
RBC: 4.02 10*6/uL (ref 3.70–5.45)
WBC: 5.5 10*3/uL (ref 3.9–10.3)
lymph#: 2.2 10*3/uL (ref 0.9–3.3)

## 2012-02-15 NOTE — Telephone Encounter (Signed)
Message from pt requesting appt to have labs checked. She had nose bleed after blowing her nose last night. Reviewed with Dr. Truett Perna: order received for CBC today. Pt understands to wait for RN to review results in lobby.

## 2012-02-21 ENCOUNTER — Telehealth: Payer: Self-pay | Admitting: *Deleted

## 2012-02-21 NOTE — Telephone Encounter (Signed)
Message copied by Gala Romney on Thu Feb 21, 2012  3:24 PM ------      Message from: Ladene Artist      Created: Sat Feb 16, 2012  7:12 PM       Please call patient, platelets are stable, schedule appt. With me or lisa next 2-3 weeks, she has gone to ER and called several times with bruising/bleeding

## 2012-02-21 NOTE — Telephone Encounter (Signed)
Called and spoke with pt son; per Dr. Truett Perna platelets count is stable.  Pt son Ashley Lutz) verbalized understanding and confirmed next appt 05/08/12.

## 2012-03-20 ENCOUNTER — Other Ambulatory Visit (HOSPITAL_BASED_OUTPATIENT_CLINIC_OR_DEPARTMENT_OTHER): Payer: Medicare Other | Admitting: Lab

## 2012-03-20 DIAGNOSIS — D696 Thrombocytopenia, unspecified: Secondary | ICD-10-CM

## 2012-03-20 LAB — CBC WITH DIFFERENTIAL/PLATELET
BASO%: 0.8 % (ref 0.0–2.0)
EOS%: 1.1 % (ref 0.0–7.0)
MCH: 30.3 pg (ref 25.1–34.0)
MCHC: 32.7 g/dL (ref 31.5–36.0)
MONO#: 0.7 10*3/uL (ref 0.1–0.9)
RDW: 16.5 % — ABNORMAL HIGH (ref 11.2–14.5)
WBC: 4.7 10*3/uL (ref 3.9–10.3)
lymph#: 1.8 10*3/uL (ref 0.9–3.3)

## 2012-05-08 ENCOUNTER — Ambulatory Visit (HOSPITAL_BASED_OUTPATIENT_CLINIC_OR_DEPARTMENT_OTHER): Payer: Medicare Other | Admitting: Oncology

## 2012-05-08 ENCOUNTER — Telehealth: Payer: Self-pay | Admitting: Oncology

## 2012-05-08 ENCOUNTER — Other Ambulatory Visit (HOSPITAL_BASED_OUTPATIENT_CLINIC_OR_DEPARTMENT_OTHER): Payer: Medicare Other | Admitting: Lab

## 2012-05-08 VITALS — BP 165/85 | HR 107 | Temp 97.3°F | Resp 20 | Ht 61.5 in | Wt 126.7 lb

## 2012-05-08 DIAGNOSIS — D696 Thrombocytopenia, unspecified: Secondary | ICD-10-CM

## 2012-05-08 DIAGNOSIS — I10 Essential (primary) hypertension: Secondary | ICD-10-CM

## 2012-05-08 LAB — CBC WITH DIFFERENTIAL/PLATELET
Basophils Absolute: 0.1 10*3/uL (ref 0.0–0.1)
EOS%: 0.8 % (ref 0.0–7.0)
Eosinophils Absolute: 0.1 10*3/uL (ref 0.0–0.5)
HGB: 11.5 g/dL — ABNORMAL LOW (ref 11.6–15.9)
MCH: 29.9 pg (ref 25.1–34.0)
MONO#: 1.1 10*3/uL — ABNORMAL HIGH (ref 0.1–0.9)
NEUT#: 3.2 10*3/uL (ref 1.5–6.5)
RDW: 17 % — ABNORMAL HIGH (ref 11.2–14.5)
WBC: 6.4 10*3/uL (ref 3.9–10.3)
lymph#: 1.9 10*3/uL (ref 0.9–3.3)

## 2012-05-08 LAB — TECHNOLOGIST REVIEW

## 2012-05-08 NOTE — Patient Instructions (Signed)
Call for increased bruising or bleeding

## 2012-05-08 NOTE — Telephone Encounter (Signed)
gv and printed appt schedule for pt for Jan 2014  Lab and est

## 2012-05-08 NOTE — Progress Notes (Signed)
   [facility]    OFFICE PROGRESS NOTE   INTERVAL HISTORY:   She returns as scheduled. She feels well. She has exertional dyspnea. There is a bruise at the left upper arm. She she believes her dog may have jumped on her and caused a bruise. No other bleeding.  Objective:  Vital signs in last 24 hours:  Blood pressure 165/85, pulse 107, temperature 97.3 F (36.3 C), temperature source Oral, resp. rate 20, height 5' 1.5" (1.562 m), weight 126 lb 11.2 oz (57.471 kg).    HEENT: Small ecchymosis at the left buccal mucosa and right side of the tongue, no active bleeding Lymphatics: No cervical or supraclavicular nodes.? 1/2 cm mobile bilateral axillary nodes Resp: Lungs clear bilaterally Cardio: Regular rate and rhythm, 2/6 systolic murmur GI: Nontender, no hepatosplenomegaly Vascular: No leg edema, left lower leg is slightly larger than the right side.  Skin: Resolving ecchymosis at the left upper arm and right lower leg     Lab Results:  Lab Results  Component Value Date   WBC 6.4 05/08/2012   HGB 11.5* 05/08/2012   HCT 35.8 05/08/2012   MCV 93.2 05/08/2012   PLT 37 Large & giant platelets* 05/08/2012   ANC 3.2 Monocytes 1.1  Medications: I have reviewed the patient's current medications.  Assessment/Plan: 1. Thrombocytopenia, question chronic ITP. The platelet count is stable. 2. History of small bilateral axillary lymph nodes. 3. Hypertension. 4. G2 P2. 5. History of a chronic mild monocytosis.    Disposition:  She is stable from a hematologic standpoint. The platelet count has not changed significantly over the past 6 months. We discussed continued observation versus a trial of prednisone. She has a few peripheral ecchymoses and mild bruising in the mouth. No other bleeding. I discussed the potential toxicities associated with prednisone. We decided to continue observation for now. Ms. Cabal knows to contact us for spontaneous bleeding or  increased bruising. She will return for an office visit and CBC in approximately 3 weeks.   Thornton Papas, MD  05/08/2012  4:36 PM

## 2012-06-03 ENCOUNTER — Other Ambulatory Visit (HOSPITAL_BASED_OUTPATIENT_CLINIC_OR_DEPARTMENT_OTHER): Payer: Medicare Other | Admitting: Lab

## 2012-06-03 ENCOUNTER — Ambulatory Visit (HOSPITAL_BASED_OUTPATIENT_CLINIC_OR_DEPARTMENT_OTHER): Payer: Medicare Other | Admitting: Nurse Practitioner

## 2012-06-03 ENCOUNTER — Telehealth: Payer: Self-pay | Admitting: Oncology

## 2012-06-03 VITALS — BP 151/79 | HR 98 | Temp 97.0°F | Resp 18 | Ht 61.5 in | Wt 123.3 lb

## 2012-06-03 DIAGNOSIS — D696 Thrombocytopenia, unspecified: Secondary | ICD-10-CM

## 2012-06-03 DIAGNOSIS — Z23 Encounter for immunization: Secondary | ICD-10-CM

## 2012-06-03 LAB — CBC WITH DIFFERENTIAL/PLATELET
BASO%: 0.9 % (ref 0.0–2.0)
HCT: 37.2 % (ref 34.8–46.6)
MCHC: 33.1 g/dL (ref 31.5–36.0)
MONO#: 0.9 10*3/uL (ref 0.1–0.9)
NEUT#: 2.7 10*3/uL (ref 1.5–6.5)
NEUT%: 46.1 % (ref 38.4–76.8)
WBC: 5.8 10*3/uL (ref 3.9–10.3)
lymph#: 2.1 10*3/uL (ref 0.9–3.3)

## 2012-06-03 MED ORDER — INFLUENZA VIRUS VACC SPLIT PF IM SUSP
0.5000 mL | Freq: Once | INTRAMUSCULAR | Status: AC
  Administered 2012-06-03: 0.5 mL via INTRAMUSCULAR
  Filled 2012-06-03: qty 0.5

## 2012-06-03 NOTE — Progress Notes (Signed)
OFFICE PROGRESS NOTE  Interval history:  Ashley Lutz returns as scheduled. She occasionally notes a small amount of blood with wiping after a bowel movement if she "strains". No other bleeding. She continues to note easy bruising. She has stable mild dyspnea on exertion. No fevers or sweats.   Objective: Blood pressure 151/79, pulse 98, temperature 97 F (36.1 C), temperature source Oral, resp. rate 18, height 5' 1.5" (1.562 m), weight 123 lb 4.8 oz (55.929 kg).  Oropharynx is without thrush or ulceration. No palpable cervical, supraclavicular or right axillary lymph nodes. Question small subcentimeter left axillary lymph nodes. Lungs are clear. Regular cardiac rhythm. 3/6 systolic murmur. Abdomen is soft and nontender. No organomegaly. Extremities are without edema. Calves are soft and nontender. Small ecchymosis at the right lower arm.  Lab Results: Lab Results  Component Value Date   WBC 5.8 06/03/2012   HGB 12.3 06/03/2012   HCT 37.2 06/03/2012   MCV 92.5 06/03/2012   PLT 37* 06/03/2012    Chemistry:    Chemistry      Component Value Date/Time   NA 137 02/06/2012 1750   K 3.3* 02/06/2012 1750   CL 104 02/06/2012 1750   CO2 25 02/06/2012 1750   BUN 11 02/06/2012 1750   CREATININE 0.65 02/06/2012 1750      Component Value Date/Time   CALCIUM 9.0 02/06/2012 1750   ALKPHOS 54 02/06/2012 1750   AST 14 02/06/2012 1750   ALT 9 02/06/2012 1750   BILITOT 0.4 02/06/2012 1750       Studies/Results: No results found.  Medications: I have reviewed the patient's current medications.  Assessment/Plan:  1. Thrombocytopenia, question chronic ITP. The platelet count is stable. 2. History of small bilateral axillary lymph nodes. 3. Hypertension. 4. G2 P2. 5. History of a chronic mild monocytosis. Stable.  Disposition-Ms. Crisp remains stable from a hematologic standpoint. We will continue to follow with routine labs. She will return for a followup visit and CBC in 8 weeks. She will contact  the office in the interim with any problems. We specifically discussed bleeding.  Plan reviewed with Dr. Truett Perna.  Lonna Cobb ANP/GNP-BC

## 2012-06-03 NOTE — Telephone Encounter (Signed)
appts made and printed for pt aom °

## 2012-07-31 ENCOUNTER — Telehealth: Payer: Self-pay | Admitting: Oncology

## 2012-07-31 ENCOUNTER — Ambulatory Visit (HOSPITAL_BASED_OUTPATIENT_CLINIC_OR_DEPARTMENT_OTHER): Payer: Medicare Other | Admitting: Oncology

## 2012-07-31 ENCOUNTER — Other Ambulatory Visit (HOSPITAL_BASED_OUTPATIENT_CLINIC_OR_DEPARTMENT_OTHER): Payer: Medicare Other | Admitting: Lab

## 2012-07-31 VITALS — BP 125/77 | HR 93 | Temp 97.5°F | Resp 18 | Ht 61.5 in | Wt 120.9 lb

## 2012-07-31 DIAGNOSIS — D696 Thrombocytopenia, unspecified: Secondary | ICD-10-CM

## 2012-07-31 DIAGNOSIS — D6949 Other primary thrombocytopenia: Secondary | ICD-10-CM

## 2012-07-31 LAB — CBC WITH DIFFERENTIAL/PLATELET
Basophils Absolute: 0.1 10*3/uL (ref 0.0–0.1)
HCT: 36.2 % (ref 34.8–46.6)
HGB: 11.7 g/dL (ref 11.6–15.9)
MCH: 29.9 pg (ref 25.1–34.0)
MONO#: 0.7 10*3/uL (ref 0.1–0.9)
NEUT%: 40 % (ref 38.4–76.8)
WBC: 4.9 10*3/uL (ref 3.9–10.3)
lymph#: 2.1 10*3/uL (ref 0.9–3.3)

## 2012-07-31 NOTE — Progress Notes (Signed)
Patient will be 79 years old this Sunday**

## 2012-07-31 NOTE — Progress Notes (Signed)
   [facility]    OFFICE PROGRESS NOTE   INTERVAL HISTORY:   She returns as scheduled. She has a small bruise at the right forearm and is not sure how this occurred. No other bleeding. No complaint. Good appetite.  Objective:  Vital signs in last 24 hours:  Blood pressure 125/77, pulse 93, temperature 97.5 F (36.4 C), temperature source Oral, resp. rate 18, height 5' 1.5" (1.562 m), weight 120 lb 14.4 oz (54.84 kg).    HEENT: A single 1 mm ecchymosis at the posterior left buccal mucosa, no other ecchymoses or bleeding. Resp: end inspiratory rhonchi at the left posterior base, no respiratory distress Cardio: Regular rate and rhythm, 2/6 systolic murmur GI: Nontender, no hepatosplenomegaly Vascular: No leg edema  Skin: 2 cm ecchymosis at the distal right forearm. No other ecchymoses or petechiae.     Lab Results:  Lab Results  Component Value Date   WBC 4.9 07/31/2012   HGB 11.7 07/31/2012   HCT 36.2 07/31/2012   MCV 92.6 07/31/2012   PLT 31* 07/31/2012   ANC 2.0   Medications: I have reviewed the patient's current medications.  Assessment/Plan: 1. Thrombocytopenia, question chronic ITP. The platelet count is stable. 2. History of small bilateral axillary lymph nodes. 3. Hypertension. 4. G2 P2. 5. History of a chronic mild monocytosis. Stable.  Disposition:  She has chronic thrombocytopenia, most likely related to ITP. We continue an observation approach. Ms. Keltner will contact us for increased bruising or bleeding. She will return for an office visit and CBC in 3 months. The plan is to begin a trial of prednisone for progressive thrombocytopenia or bleeding.   Thornton Papas, MD  07/31/2012  3:54 PM

## 2012-07-31 NOTE — Telephone Encounter (Signed)
Gave pt appt for lab and ML on June 2014

## 2012-08-13 ENCOUNTER — Telehealth: Payer: Self-pay | Admitting: Dietician

## 2012-10-31 ENCOUNTER — Telehealth: Payer: Self-pay | Admitting: Oncology

## 2012-10-31 ENCOUNTER — Other Ambulatory Visit (HOSPITAL_BASED_OUTPATIENT_CLINIC_OR_DEPARTMENT_OTHER): Payer: Medicare Other | Admitting: Lab

## 2012-10-31 ENCOUNTER — Ambulatory Visit (HOSPITAL_BASED_OUTPATIENT_CLINIC_OR_DEPARTMENT_OTHER): Payer: Medicare Other | Admitting: Nurse Practitioner

## 2012-10-31 VITALS — BP 153/74 | HR 90 | Temp 97.5°F | Ht 61.0 in | Wt 116.4 lb

## 2012-10-31 DIAGNOSIS — D696 Thrombocytopenia, unspecified: Secondary | ICD-10-CM

## 2012-10-31 DIAGNOSIS — R634 Abnormal weight loss: Secondary | ICD-10-CM

## 2012-10-31 DIAGNOSIS — I10 Essential (primary) hypertension: Secondary | ICD-10-CM

## 2012-10-31 LAB — CBC WITH DIFFERENTIAL/PLATELET
Eosinophils Absolute: 0.1 10*3/uL (ref 0.0–0.5)
HCT: 34.4 % — ABNORMAL LOW (ref 34.8–46.6)
LYMPH%: 36.6 % (ref 14.0–49.7)
MONO#: 1.2 10*3/uL — ABNORMAL HIGH (ref 0.1–0.9)
NEUT#: 2.9 10*3/uL (ref 1.5–6.5)
NEUT%: 43.6 % (ref 38.4–76.8)
Platelets: 25 10*3/uL — ABNORMAL LOW (ref 145–400)
WBC: 6.7 10*3/uL (ref 3.9–10.3)

## 2012-10-31 MED ORDER — PREDNISONE 20 MG PO TABS: 40.0000 mg | ORAL_TABLET | Freq: Every day | ORAL | Status: DC

## 2012-10-31 NOTE — Telephone Encounter (Signed)
gv adn printed appt sched and avs for pt °

## 2012-10-31 NOTE — Progress Notes (Signed)
OFFICE PROGRESS NOTE  Interval history:  Ashley Lutz returns as scheduled. She continues to note easy bruising. She denies any other bleeding. She has a good appetite. She has noted recent weight loss. She denies any fevers. She has occasional sweats.   Objective: Blood pressure 153/74, pulse 90, temperature 97.5 F (36.4 C), temperature source Oral, height 5\' 1"  (1.549 m), weight 116 lb 6.4 oz (52.799 kg).  Oropharynx is without thrush or ulceration. No bleeding in the oral cavity. No palpable cervical, supraclavicular or axillary lymph nodes. Rhonchi at the left lung base. No respiratory distress. Regular cardiac rhythm. 3/6 systolic murmur. Abdomen is soft and nontender. No organomegaly. Extremities are without edema. 3-4 centimeter ecchymoses at the lower pretibial regions bilaterally.  Lab Results: Lab Results  Component Value Date   WBC 6.7 10/31/2012   HGB 11.3* 10/31/2012   HCT 34.4* 10/31/2012   MCV 92.0 10/31/2012   PLT 25* 10/31/2012    Chemistry:    Chemistry      Component Value Date/Time   NA 137 02/06/2012 1750   K 3.3* 02/06/2012 1750   CL 104 02/06/2012 1750   CO2 25 02/06/2012 1750   BUN 11 02/06/2012 1750   CREATININE 0.65 02/06/2012 1750      Component Value Date/Time   CALCIUM 9.0 02/06/2012 1750   ALKPHOS 54 02/06/2012 1750   AST 14 02/06/2012 1750   ALT 9 02/06/2012 1750   BILITOT 0.4 02/06/2012 1750       Studies/Results: No results found.  Medications: I have reviewed the patient's current medications.  Assessment/Plan:  1. Thrombocytopenia, question chronic ITP. The platelet count is lower. 2. History of small bilateral axillary lymph nodes. No lymph nodes palpated on exam today. 3. Hypertension. 4. G2 P2. 5. History of a chronic mild monocytosis. Stable. 6. Weight loss. Question etiology.  Disposition-Ms. Bosak's platelet count is lower and she has increased bruising. Dr. Truett Perna recommends a trial of prednisone 40 mg daily. We reviewed potential  side effects associated with steroids including emotional lability, weight gain, peripheral edema, steroid psychosis, increased blood sugar, decreased bone density and increased risk of infections. She will return for a repeat platelet count on 11/10/2012. She will return for a followup visit on 11/25/2012. She will contact the office in the interim with any problems.  Patient seen with Dr. Truett Perna.  Ashley Lutz ANP/GNP-BC

## 2012-11-10 ENCOUNTER — Other Ambulatory Visit (HOSPITAL_BASED_OUTPATIENT_CLINIC_OR_DEPARTMENT_OTHER): Payer: Medicare Other | Admitting: Lab

## 2012-11-10 DIAGNOSIS — D696 Thrombocytopenia, unspecified: Secondary | ICD-10-CM

## 2012-11-10 LAB — CBC WITH DIFFERENTIAL/PLATELET
Basophils Absolute: 0.1 10*3/uL (ref 0.0–0.1)
EOS%: 0.1 % (ref 0.0–7.0)
HCT: 38.2 % (ref 34.8–46.6)
HGB: 12.2 g/dL (ref 11.6–15.9)
MCH: 29.9 pg (ref 25.1–34.0)
MCV: 93.6 fL (ref 79.5–101.0)
NEUT%: 78.1 % — ABNORMAL HIGH (ref 38.4–76.8)
lymph#: 3.1 10*3/uL (ref 0.9–3.3)

## 2012-11-11 ENCOUNTER — Telehealth: Payer: Self-pay | Admitting: Nurse Practitioner

## 2012-11-11 DIAGNOSIS — D696 Thrombocytopenia, unspecified: Secondary | ICD-10-CM

## 2012-11-11 NOTE — Telephone Encounter (Signed)
Left message for patient requesting return call.  Need to inform patient to decrease Prednisone to 30mg  daily.  Pt also needs CBC in 10 days.  POF sent to schedulers.

## 2012-11-12 ENCOUNTER — Telehealth: Payer: Self-pay | Admitting: *Deleted

## 2012-11-12 NOTE — Telephone Encounter (Signed)
Notified patient of counts and to decrease prednisone to 30 mg daily ( 1 1/2 tabs). Scheduler will call her for repeat lab in 10 days.

## 2012-11-25 ENCOUNTER — Ambulatory Visit (HOSPITAL_BASED_OUTPATIENT_CLINIC_OR_DEPARTMENT_OTHER): Payer: Medicare Other | Admitting: Nurse Practitioner

## 2012-11-25 ENCOUNTER — Telehealth: Payer: Self-pay | Admitting: Oncology

## 2012-11-25 ENCOUNTER — Other Ambulatory Visit (HOSPITAL_BASED_OUTPATIENT_CLINIC_OR_DEPARTMENT_OTHER): Payer: Medicare Other | Admitting: Lab

## 2012-11-25 VITALS — BP 162/73 | HR 72 | Temp 97.1°F | Resp 18 | Ht 61.0 in | Wt 118.9 lb

## 2012-11-25 DIAGNOSIS — D696 Thrombocytopenia, unspecified: Secondary | ICD-10-CM

## 2012-11-25 LAB — CBC WITH DIFFERENTIAL/PLATELET
BASO%: 0.1 % (ref 0.0–2.0)
Basophils Absolute: 0 10*3/uL (ref 0.0–0.1)
Eosinophils Absolute: 0 10*3/uL (ref 0.0–0.5)
HCT: 37.8 % (ref 34.8–46.6)
HGB: 12.2 g/dL (ref 11.6–15.9)
LYMPH%: 11.4 % — ABNORMAL LOW (ref 14.0–49.7)
MCHC: 32.3 g/dL (ref 31.5–36.0)
MONO#: 1.2 10*3/uL — ABNORMAL HIGH (ref 0.1–0.9)
NEUT%: 80.2 % — ABNORMAL HIGH (ref 38.4–76.8)
Platelets: 35 10*3/uL — ABNORMAL LOW (ref 145–400)
WBC: 14.4 10*3/uL — ABNORMAL HIGH (ref 3.9–10.3)

## 2012-11-25 NOTE — Telephone Encounter (Signed)
Gave pt appt for lab and MD on July 2014 °

## 2012-11-25 NOTE — Progress Notes (Signed)
OFFICE PROGRESS NOTE  Interval history:  Ashley Lutz returns as scheduled. She was started on prednisone 40 mg daily on 10/31/2012 when the platelet count returned at 25,000. Followup platelet count on 11/10/2012 returned at 66,000. The prednisone dose was decreased to 30 mg daily.  She denies bleeding. She continues to note easy bruising. She notes intermittent ankle swelling with prolonged standing. She has a good appetite.   Objective: Blood pressure 162/73, pulse 72, temperature 97.1 F (36.2 C), temperature source Oral, resp. rate 18, height 5\' 1"  (1.549 m), weight 118 lb 14.4 oz (53.933 kg), SpO2 98.00%.  No thrush or ulceration. Lungs clear. Regular cardiac rhythm with occasional premature beat. 3/6 systolic murmur. Abdomen soft and nontender. No organomegaly. No leg edema. Resolving ecchymoses at the lower legs bilaterally. A few small ecchymoses dorsal aspect of the hands and forearms.  Lab Results: Lab Results  Component Value Date   WBC 14.4* 11/25/2012   HGB 12.2 11/25/2012   HCT 37.8 11/25/2012   MCV 94.3 11/25/2012   PLT 35* 11/25/2012    Chemistry:    Chemistry      Component Value Date/Time   NA 137 02/06/2012 1750   K 3.3* 02/06/2012 1750   CL 104 02/06/2012 1750   CO2 25 02/06/2012 1750   BUN 11 02/06/2012 1750   CREATININE 0.65 02/06/2012 1750      Component Value Date/Time   CALCIUM 9.0 02/06/2012 1750   ALKPHOS 54 02/06/2012 1750   AST 14 02/06/2012 1750   ALT 9 02/06/2012 1750   BILITOT 0.4 02/06/2012 1750       Studies/Results: No results found.  Medications: I have reviewed the patient's current medications.  Assessment/Plan:  1. Thrombocytopenia, question chronic ITP. The platelet count returned at 25,000 on 10/31/2012. Prednisone 40 mg daily initiated. Platelet count returned at 66,000 on 11/10/2012.  Prednisone dose decreased to 30 mg daily. 2. History of small bilateral axillary lymph nodes. No lymph nodes noted on exam  10/31/2012. 3. Hypertension. 4. G2 P2. 5. History of a chronic mild monocytosis. Stable. 6. Weight loss. Question etiology. Weight is stable.  Disposition-she will continue prednisone 30 mg daily and return for a CBC in 2 weeks. We will see her in followup on 12/18/2012. She will contact the office in the interim with any problems. We specifically discussed bleeding.  Plan reviewed with Dr. Truett Perna.  Lonna Cobb ANP/GNP-BC

## 2012-12-01 ENCOUNTER — Emergency Department (HOSPITAL_COMMUNITY): Payer: Medicare Other

## 2012-12-01 ENCOUNTER — Emergency Department (HOSPITAL_COMMUNITY)
Admission: EM | Admit: 2012-12-01 | Discharge: 2012-12-01 | Disposition: A | Discharge status: Home / Self Care | Payer: Medicare Other | Attending: Emergency Medicine | Admitting: Emergency Medicine

## 2012-12-01 ENCOUNTER — Telehealth: Payer: Self-pay | Admitting: *Deleted

## 2012-12-01 ENCOUNTER — Encounter (HOSPITAL_COMMUNITY): Payer: Self-pay | Admitting: Emergency Medicine

## 2012-12-01 DIAGNOSIS — R1032 Left lower quadrant pain: Secondary | ICD-10-CM | POA: Insufficient documentation

## 2012-12-01 DIAGNOSIS — K6289 Other specified diseases of anus and rectum: Secondary | ICD-10-CM | POA: Insufficient documentation

## 2012-12-01 DIAGNOSIS — Z79899 Other long term (current) drug therapy: Secondary | ICD-10-CM | POA: Insufficient documentation

## 2012-12-01 DIAGNOSIS — D696 Thrombocytopenia, unspecified: Secondary | ICD-10-CM | POA: Insufficient documentation

## 2012-12-01 DIAGNOSIS — R011 Cardiac murmur, unspecified: Secondary | ICD-10-CM | POA: Insufficient documentation

## 2012-12-01 DIAGNOSIS — Z862 Personal history of diseases of the blood and blood-forming organs and certain disorders involving the immune mechanism: Secondary | ICD-10-CM | POA: Insufficient documentation

## 2012-12-01 DIAGNOSIS — E169 Disorder of pancreatic internal secretion, unspecified: Secondary | ICD-10-CM | POA: Insufficient documentation

## 2012-12-01 DIAGNOSIS — Z9889 Other specified postprocedural states: Secondary | ICD-10-CM | POA: Insufficient documentation

## 2012-12-01 DIAGNOSIS — K5641 Fecal impaction: Secondary | ICD-10-CM

## 2012-12-01 DIAGNOSIS — Z87828 Personal history of other (healed) physical injury and trauma: Secondary | ICD-10-CM | POA: Insufficient documentation

## 2012-12-01 DIAGNOSIS — R339 Retention of urine, unspecified: Secondary | ICD-10-CM

## 2012-12-01 DIAGNOSIS — I10 Essential (primary) hypertension: Secondary | ICD-10-CM | POA: Insufficient documentation

## 2012-12-01 DIAGNOSIS — K869 Disease of pancreas, unspecified: Secondary | ICD-10-CM

## 2012-12-01 LAB — URINE MICROSCOPIC-ADD ON

## 2012-12-01 LAB — CBC WITH DIFFERENTIAL/PLATELET
Basophils Absolute: 0 10*3/uL (ref 0.0–0.1)
Eosinophils Relative: 0 % (ref 0–5)
HCT: 36.6 % (ref 36.0–46.0)
Lymphocytes Relative: 18 % (ref 12–46)
Lymphs Abs: 2.6 10*3/uL (ref 0.7–4.0)
MCV: 94.1 fL (ref 78.0–100.0)
Monocytes Relative: 13 % — ABNORMAL HIGH (ref 3–12)
Neutro Abs: 10.2 10*3/uL — ABNORMAL HIGH (ref 1.7–7.7)
RBC: 3.89 MIL/uL (ref 3.87–5.11)
WBC: 14.7 10*3/uL — ABNORMAL HIGH (ref 4.0–10.5)

## 2012-12-01 LAB — COMPREHENSIVE METABOLIC PANEL
AST: 15 U/L (ref 0–37)
Albumin: 3.5 g/dL (ref 3.5–5.2)
BUN: 20 mg/dL (ref 6–23)
Calcium: 8.7 mg/dL (ref 8.4–10.5)
Chloride: 100 mEq/L (ref 96–112)
Creatinine, Ser: 0.66 mg/dL (ref 0.50–1.10)
Total Bilirubin: 0.8 mg/dL (ref 0.3–1.2)
Total Protein: 7.5 g/dL (ref 6.0–8.3)

## 2012-12-01 LAB — URINALYSIS, ROUTINE W REFLEX MICROSCOPIC
Glucose, UA: NEGATIVE mg/dL
Specific Gravity, Urine: 1.02 (ref 1.005–1.030)
pH: 7.5 (ref 5.0–8.0)

## 2012-12-01 MED ORDER — LOPERAMIDE HCL 2 MG PO CAPS
4.0000 mg | ORAL_CAPSULE | Freq: Once | ORAL | Status: AC
  Administered 2012-12-01: 4 mg via ORAL
  Filled 2012-12-01: qty 2

## 2012-12-01 MED ORDER — CIPROFLOXACIN HCL 500 MG PO TABS
500.0000 mg | ORAL_TABLET | Freq: Once | ORAL | Status: AC
  Administered 2012-12-01: 500 mg via ORAL
  Filled 2012-12-01: qty 1

## 2012-12-01 MED ORDER — IOHEXOL 300 MG/ML  SOLN
50.0000 mL | Freq: Once | INTRAMUSCULAR | Status: AC | PRN
  Administered 2012-12-01: 50 mL via ORAL

## 2012-12-01 MED ORDER — CIPROFLOXACIN HCL 500 MG PO TABS: 500.0000 mg | ORAL_TABLET | Freq: Two times a day (BID) | ORAL | Status: DC

## 2012-12-01 MED ORDER — POTASSIUM CHLORIDE CRYS ER 20 MEQ PO TBCR
40.0000 meq | EXTENDED_RELEASE_TABLET | Freq: Once | ORAL | Status: AC
  Administered 2012-12-01: 40 meq via ORAL
  Filled 2012-12-01: qty 2

## 2012-12-01 MED ORDER — METRONIDAZOLE 500 MG PO TABS: 500.0000 mg | ORAL_TABLET | Freq: Two times a day (BID) | ORAL | Status: DC

## 2012-12-01 MED ORDER — IOHEXOL 300 MG/ML  SOLN
100.0000 mL | Freq: Once | INTRAMUSCULAR | Status: AC | PRN
  Administered 2012-12-01: 100 mL via INTRAVENOUS

## 2012-12-01 MED ORDER — SODIUM CHLORIDE 0.9 % IV BOLUS (SEPSIS)
1000.0000 mL | Freq: Once | INTRAVENOUS | Status: AC
  Administered 2012-12-01: 1000 mL via INTRAVENOUS

## 2012-12-01 MED ORDER — FLEET ENEMA 7-19 GM/118ML RE ENEM
1.0000 | ENEMA | Freq: Once | RECTAL | Status: AC
  Administered 2012-12-01: 05:00:00 via RECTAL
  Filled 2012-12-01: qty 1

## 2012-12-01 MED ORDER — METRONIDAZOLE 500 MG PO TABS
500.0000 mg | ORAL_TABLET | Freq: Once | ORAL | Status: AC
  Administered 2012-12-01: 500 mg via ORAL
  Filled 2012-12-01: qty 1

## 2012-12-01 NOTE — ED Notes (Signed)
Bed:WA20<BR> Expected date:<BR> Expected time:<BR> Means of arrival:<BR> Comments:<BR> EMS

## 2012-12-01 NOTE — ED Notes (Signed)
Rectum inspected no large hemorrhoids noted, pt noted to have brown stool in underwear, liquid brown stool oozing from rectum. Pt c/o pain to the area. Pt cleaned, peri care provided.

## 2012-12-01 NOTE — ED Notes (Signed)
Cath care instruction given to patient and son in complete detail.  Verbalized understanding.

## 2012-12-01 NOTE — Telephone Encounter (Signed)
Left VM for son to give update on what they are doing with her for follow up now regarding the foley cath and potential for future scanning based on CT scan. Made him aware that prednisone was decreased to 30 mg/day on 11/25/12 and will make MD aware of ER visit and labs/scan report to determine what needs to be done regarding her ITP.

## 2012-12-01 NOTE — ED Provider Notes (Addendum)
History    CSN: [id number] Arrival date & time 12/01/12  0104  First MD Initiated Contact with Patient 12/01/12 0134     Chief Complaint  Patient presents with  . Diarrhea   (Consider location/radiation/quality/duration/timing/severity/associated sxs/prior Treatment) The history is provided by the patient.  Ashley Lutz is a 80 y.o. female hx of thrombocytopenia, HTN here with diarrhea. Diarrhea since yesterday. Brown stool and not melena. She also was recently started on prednisone for thrombocytopenia. Also complains of some LLQ pain as well. Denies fever or vomiting or recent antibiotic use.   Past Medical History  Diagnosis Date  . Thrombocytopenia 2010  . Hypertension   . Heart murmur   . Wrist fracture, left   . Chronic idiopathic monocytosis   . Enlargement of lymph nodes     H/O small bilateral axillary lymph nodes   Past Surgical History  Procedure Laterality Date  . Hemorrhoid surgery    . Bunionectomy    . Soft tissue cyst excision      X 5 occasions--cysts on scalp   No family history on file. History  Substance Use Topics  . Smoking status: Never Smoker   . Smokeless tobacco: Not on file  . Alcohol Use: No   OB History   Grav Para Term Preterm Abortions TAB SAB Ect Mult Living                 Review of Systems  Gastrointestinal: Positive for abdominal pain and diarrhea.  All other systems reviewed and are negative.    Allergies  Milk-related compounds  Home Medications   Current Outpatient Rx  Name  Route  Sig  Dispense  Refill  . amLODipine (NORVASC) 5 MG tablet   Oral   Take 5 mg by mouth daily.         . Multiple Vitamins-Minerals (CENTRUM SILVER PO)   Oral   Take 1 tablet by mouth daily.         . predniSONE (DELTASONE) 20 MG tablet   Oral   Take 30 mg by mouth daily.         . Psyllium (METAMUCIL PO)   Oral   Take by mouth daily.         . ramipril (ALTACE) 10 MG capsule   Oral   Take 10 mg by mouth daily.           BP 137/55  Pulse 146  Temp(Src) 98 F (36.7 C) (Oral)  Resp 20  SpO2 88% Physical Exam  Nursing note and vitals reviewed. Constitutional: She is oriented to person, place, and time.  Younger than stated age, slightly uncomfortable   HENT:  Head: Normocephalic.  MM slightly dry   Eyes: Conjunctivae are normal. Pupils are equal, round, and reactive to light.  Neck: Normal range of motion. Neck supple.  Cardiovascular: Normal rate, regular rhythm and normal heart sounds.   Pulmonary/Chest: Effort normal and breath sounds normal. No respiratory distress. She has no wheezes. She has no rales.  Abdominal: Soft.  Mildly distended, + LLQ tenderness, no rebound.   Musculoskeletal: Normal range of motion.  Neurological: She is alert and oriented to person, place, and time.  Skin: Skin is warm and dry.  Psychiatric: She has a normal mood and affect. Her behavior is normal. Judgment and thought content normal.    ED Course  Procedures (including critical care time)  Stool disimpaction I performed digital rectal disimpaction. No complications.   Labs Reviewed  URINALYSIS,  ROUTINE W REFLEX MICROSCOPIC - Abnormal; Notable for the following:    APPearance CLOUDY (*)    Hgb urine dipstick SMALL (*)    Leukocytes, UA SMALL (*)    All other components within normal limits  URINE MICROSCOPIC-ADD ON - Abnormal; Notable for the following:    Bacteria, UA FEW (*)    All other components within normal limits  CBC WITH DIFFERENTIAL - Abnormal; Notable for the following:    WBC 14.7 (*)    Hemoglobin 11.9 (*)    RDW 20.2 (*)    Platelets 25 (*)    Monocytes Relative 13 (*)    Neutro Abs 10.2 (*)    Monocytes Absolute 1.9 (*)    All other components within normal limits  COMPREHENSIVE METABOLIC PANEL - Abnormal; Notable for the following:    Potassium 3.2 (*)    GFR calc non Af Amer 74 (*)    GFR calc Af Amer 86 (*)    All other components within normal limits  URINE CULTURE   CBC WITH DIFFERENTIAL   Ct Abdomen Pelvis W Contrast  12/01/2012   *RADIOLOGY REPORT*  Clinical Data: Left lower quadrant pain.  Excessive bowel movements.  White cell count 14.7.  Red cells and white cells in urine.  CT ABDOMEN AND PELVIS WITH CONTRAST  Technique:  Multidetector CT imaging of the abdomen and pelvis was performed following the standard protocol during bolus administration of intravenous contrast.  Contrast: OMNIPAQUE IOHEXOL 300 MG/ML  SOLN  Comparison: Abdomen 12/01/2012  Findings: Fibrosis and atelectasis in the lung bases. Calcification in the mitral and aortic valves with cardiac enlargement, particularly of the left atrium.  The liver, spleen, gallbladder, adrenal glands, kidneys, inferior vena cava, and retroperitoneal lymph nodes are unremarkable. Calcification of the abdominal aorta without aneurysm.  The pancreas demonstrates a small low attenuation lesion in the junction of the body and tail measuring 9 mm diameter.  There appears to be mild pancreatic ductal dilatation from the tail the pancreas to the lesion. An obstructing intraductal lesion is not excluded.  Consider further evaluation with MRCP.  The stomach and small bowel are decompressed.  Stool filled colon without distension.  No free air or free fluid in the abdomen.  Pelvis:  There is circumferential thickening of the wall of the anorectal junction.  This could be due to inflammatory process or neoplasm.  Direct visualization is recommended to exclude colon cancer.  There is a small amount of free fluid in the pelvis.  The bladder is decompressed with Foley catheter.  No evidence of diverticulitis.  The appendix is not identified.  No significant pelvic lymphadenopathy.  Degenerative changes in the lumbar spine.  IMPRESSION: Proctitis versus colon cancer causing colonic wall thickening of the anorectal junction.  No proximal obstruction.  9 mm low attenuation lesion at the junction of the body and tail the pancreas  with upstream pancreatic ductal dilatation.  Consider MRCP to exclude early pancreatic obstructing lesion.   Original Report Authenticated By: Burman Nieves, M.D.   Dg Abd Acute W/chest  12/01/2012   *RADIOLOGY REPORT*  Clinical Data: Nausea, diarrhea, abdominal pain.  ACUTE ABDOMEN SERIES (ABDOMEN 2 VIEW & CHEST 1 VIEW)  Comparison: Chest 11/05/2011  Findings: Cardiac enlargement with normal pulmonary vascularity. Interstitial fibrosis in the lungs is similar to previous study. No focal airspace consolidation.  No pneumothorax.  Calcification of the aorta.  Scattered gas and stool throughout the colon.  No small or large bowel distension.  No  free intra-abdominal air.  No abnormal air fluid levels.  Thoracolumbar scoliosis and degenerative changes. Vascular calcifications.  No radiopaque stones demonstrated. Calcified phleboliths in the pelvis.  IMPRESSION: Chronic interstitial fibrosis in the lungs.  No evidence of active pulmonary disease.  Nonobstructive bowel gas pattern.   Original Report Authenticated By: Burman Nieves, M.D.   No diagnosis found.   Date: 12/01/2012  Rate: 96  Rhythm: sinus arrhythmia  QRS Axis: normal  Intervals: normal  ST/T Wave abnormalities: normal  Conduction Disutrbances:none  Narrative Interpretation:   Old EKG Reviewed: none available    MDM  Ashley Lutz is a 79 y.o. female here with diarrhea and LLQ pain. Will need to r/o diverticulitis, UTI. Will get labs, UA, CT ab/pel. Will give imodium for symptomatic relief.   4 AM Patient had a lot of rectal pain. Rectal exam showed patient impacted. I disimpacted patient. Will give an enema.   4:30 AM Bladder scan greater than 200. Foley placed with 1 L drainage.   5:41 AM CT showed proctitis from fecal impaction. WBC 15 so will give cipro/flagyl empirically. Platelet count slightly lower. There is incidental lesion on pancreas. Will have her take miralax, follow up with heme/onc, urology, and PMD and get  MRCP outpatient.   Richardean Canal, MD 12/01/12 1610  Richardean Canal, MD 12/01/12 [phone]

## 2012-12-01 NOTE — Telephone Encounter (Signed)
Was in ER last night-could not void and platelet count was low at 25. Was told to call Dr. Truett Perna about that.

## 2012-12-01 NOTE — ED Notes (Signed)
Per EMS pt transported from home c/o excessive bowel movements and rectal pain onset Saturday. A & O, PWD

## 2012-12-01 NOTE — Telephone Encounter (Signed)
Per Dr. Truett Perna : Resume Prednisone dose at 40 mg daily and recheck counts next week as scheduled. Notified son.

## 2012-12-02 LAB — URINE CULTURE

## 2012-12-05 ENCOUNTER — Telehealth: Payer: Self-pay | Admitting: *Deleted

## 2012-12-05 NOTE — Telephone Encounter (Signed)
Late entry for 7/10: Message from [facility] reporting pt saw Dr. Annabell Howells in urology. Catheter was removed. Following up with PCP and Primary MD. Next lab appt in this office 7/15.

## 2012-12-09 ENCOUNTER — Other Ambulatory Visit: Payer: Medicare Other

## 2012-12-10 ENCOUNTER — Other Ambulatory Visit (HOSPITAL_BASED_OUTPATIENT_CLINIC_OR_DEPARTMENT_OTHER): Payer: Medicare Other | Admitting: Lab

## 2012-12-10 DIAGNOSIS — D696 Thrombocytopenia, unspecified: Secondary | ICD-10-CM

## 2012-12-10 LAB — CBC WITH DIFFERENTIAL/PLATELET
Basophils Absolute: 0 10*3/uL (ref 0.0–0.1)
Eosinophils Absolute: 0 10*3/uL (ref 0.0–0.5)
HGB: 10.2 g/dL — ABNORMAL LOW (ref 11.6–15.9)
LYMPH%: 19.4 % (ref 14.0–49.7)
MONO#: 1 10*3/uL — ABNORMAL HIGH (ref 0.1–0.9)
NEUT#: 7.7 10*3/uL — ABNORMAL HIGH (ref 1.5–6.5)
Platelets: 31 10*3/uL — ABNORMAL LOW (ref 145–400)
RBC: 3.37 10*6/uL — ABNORMAL LOW (ref 3.70–5.45)
RDW: 20.3 % — ABNORMAL HIGH (ref 11.2–14.5)
WBC: 10.8 10*3/uL — ABNORMAL HIGH (ref 3.9–10.3)
nRBC: 0 % (ref 0–0)

## 2012-12-10 LAB — TECHNOLOGIST REVIEW

## 2012-12-11 ENCOUNTER — Telehealth: Payer: Self-pay | Admitting: *Deleted

## 2012-12-11 NOTE — Telephone Encounter (Signed)
Message copied by Caleb Popp on Thu Dec 11, 2012  9:46 AM ------      Message from: Thornton Papas B      Created: Wed Dec 10, 2012  8:54 PM       Please call patient, platelets are stable, needs bmbx to evaluate persistent thrombocytopenia and anemia      Schedule bmbx in radiology ------

## 2012-12-11 NOTE — Telephone Encounter (Signed)
Spoke with pt's son, lab results given. Dr. Truett Perna recommends bone marrow biopsy. Ashley Lutz voiced understanding. Knows to expect call from schedulers.

## 2012-12-18 ENCOUNTER — Telehealth: Payer: Self-pay | Admitting: Oncology

## 2012-12-18 ENCOUNTER — Ambulatory Visit (HOSPITAL_BASED_OUTPATIENT_CLINIC_OR_DEPARTMENT_OTHER): Payer: Medicare Other | Admitting: Oncology

## 2012-12-18 ENCOUNTER — Other Ambulatory Visit (HOSPITAL_BASED_OUTPATIENT_CLINIC_OR_DEPARTMENT_OTHER): Payer: Medicare Other | Admitting: Lab

## 2012-12-18 VITALS — BP 154/83 | HR 88 | Temp 97.8°F | Resp 18 | Ht 61.0 in | Wt 120.6 lb

## 2012-12-18 DIAGNOSIS — D696 Thrombocytopenia, unspecified: Secondary | ICD-10-CM

## 2012-12-18 LAB — CBC WITH DIFFERENTIAL/PLATELET
Basophils Absolute: 0 10*3/uL (ref 0.0–0.1)
Eosinophils Absolute: 0 10*3/uL (ref 0.0–0.5)
HCT: 34.7 % — ABNORMAL LOW (ref 34.8–46.6)
HGB: 11 g/dL — ABNORMAL LOW (ref 11.6–15.9)
NEUT#: 8.4 10*3/uL — ABNORMAL HIGH (ref 1.5–6.5)
RDW: 20.8 % — ABNORMAL HIGH (ref 11.2–14.5)
lymph#: 1.3 10*3/uL (ref 0.9–3.3)

## 2012-12-18 LAB — TECHNOLOGIST REVIEW

## 2012-12-18 NOTE — Progress Notes (Signed)
   [facility]    OFFICE PROGRESS NOTE   INTERVAL HISTORY:   She returns as scheduled. She continues prednisone. No bleeding. She was seen in the emergency room on 12/01/2012 with a fecal impaction and urinary retention. She was evaluated by urology. A Foley catheter has been removed. She reports no difficulty with bowel or bladder control at present.  The platelet count was lower and she had developed more anemia last week. We ordered a bone marrow biopsy. This has not been scheduled.  Objective:  Vital signs in last 24 hours:  Blood pressure 154/83, pulse 88, temperature 97.8 F (36.6 C), temperature source Oral, resp. rate 18, height 5\' 1"  (1.549 m), weight 120 lb 9.6 oz (54.704 kg).    HEENT: No thrush or bleeding Lymphatics: No cervical, supraclavicular, or axillary nodes Resp: Lungs clear bilaterally Cardio: Regular rate and rhythm GI: No hepatosplenomegaly Vascular: No leg edema  Skin: Ecchymoses over the legs     Lab Results:  Lab Results  Component Value Date   WBC 10.1 12/18/2012   HGB 11.0* 12/18/2012   HCT 34.7* 12/18/2012   MCV 95.3 12/18/2012   PLT 39* 12/18/2012   ANC 8.4    Medications: I have reviewed the patient's current medications.  Assessment/Plan: 1. Thrombocytopenia, question chronic ITP. The platelet count returned at 25,000 on 10/31/2012. Prednisone 40 mg daily initiated. Platelet count returned at 66,000 on 11/10/2012. The platelet count has declined despite continuation of prednisone over the past month. 2. History of small bilateral axillary lymph nodes. No lymph nodes noted on exam today 3. Hypertension. 4. G2 P2. 5. History of a chronic mild monocytosis. Stable. 6. Weight loss. Question etiology. Weight is stable. 7. Evaluation emergency room with a fecal impaction and urinary retention 12/01/2012 8. CT abdomen 12/01/2012 revealed a 9 mm low-attenuation lesion at the junction of the body and tail of the pancreas with the  upstream pancreatic ductal dilatation  Disposition:  The platelet count has improved minimally while on prednisone over the past month. She has anemia. I am concerned she has an underlying hematologic disorder such as myelodysplasia. We will refer her for a diagnostic bone marrow biopsy during the week of 12/22/2012. She will continue prednisone at a dose of 30 mg daily. Ms. Ainley will return for an office visit in 2 weeks.   Thornton Papas, MD  12/18/2012  5:20 PM

## 2012-12-18 NOTE — Telephone Encounter (Signed)
gv and printed appt sched and avs for pt  °

## 2012-12-19 ENCOUNTER — Other Ambulatory Visit: Payer: Self-pay | Admitting: Radiology

## 2012-12-24 ENCOUNTER — Ambulatory Visit (HOSPITAL_COMMUNITY): Admission: RE | Admit: 2012-12-24 | Payer: Medicare Other | Source: Ambulatory Visit

## 2012-12-25 ENCOUNTER — Other Ambulatory Visit: Payer: Self-pay | Admitting: *Deleted

## 2012-12-25 ENCOUNTER — Telehealth: Payer: Self-pay | Admitting: *Deleted

## 2012-12-25 NOTE — Telephone Encounter (Signed)
Per radiology fax, bone marrow rescheduled from 7/29 tp 01/05/13 at 1100.

## 2012-12-25 NOTE — Telephone Encounter (Signed)
Left VM at patient's and son's voice mail that OV for 8/7 will be cancelled and rescheduled for after 8/11 so bone marrow biopsy results will be available. POF to scheduler for change.

## 2012-12-26 ENCOUNTER — Inpatient Hospital Stay (HOSPITAL_COMMUNITY): Admission: RE | Admit: 2012-12-26 | Payer: Medicare Other | Source: Ambulatory Visit

## 2012-12-26 ENCOUNTER — Other Ambulatory Visit (HOSPITAL_COMMUNITY): Payer: Medicare Other

## 2012-12-29 ENCOUNTER — Telehealth: Payer: Self-pay | Admitting: Oncology

## 2012-12-31 ENCOUNTER — Encounter (HOSPITAL_COMMUNITY): Payer: Self-pay | Admitting: Pharmacy Technician

## 2013-01-01 ENCOUNTER — Ambulatory Visit: Payer: Medicare Other | Admitting: Nurse Practitioner

## 2013-01-01 ENCOUNTER — Telehealth: Payer: Self-pay | Admitting: *Deleted

## 2013-01-01 NOTE — Telephone Encounter (Signed)
Left VM reporting the only medications patient is taking at home is BP med, steroid and MVI. Was told our office had requested this information.

## 2013-01-02 ENCOUNTER — Other Ambulatory Visit: Payer: Self-pay | Admitting: Radiology

## 2013-01-05 ENCOUNTER — Encounter (HOSPITAL_COMMUNITY): Payer: Self-pay

## 2013-01-05 ENCOUNTER — Ambulatory Visit (HOSPITAL_COMMUNITY)
Admission: RE | Admit: 2013-01-05 | Discharge: 2013-01-05 | Disposition: A | Discharge status: Home / Self Care | Payer: Medicare Other | Source: Ambulatory Visit | Attending: Oncology | Admitting: Oncology

## 2013-01-05 DIAGNOSIS — R0602 Shortness of breath: Secondary | ICD-10-CM | POA: Insufficient documentation

## 2013-01-05 DIAGNOSIS — IMO0002 Reserved for concepts with insufficient information to code with codable children: Secondary | ICD-10-CM | POA: Insufficient documentation

## 2013-01-05 DIAGNOSIS — D7289 Other specified disorders of white blood cells: Secondary | ICD-10-CM | POA: Insufficient documentation

## 2013-01-05 DIAGNOSIS — R011 Cardiac murmur, unspecified: Secondary | ICD-10-CM | POA: Insufficient documentation

## 2013-01-05 DIAGNOSIS — D649 Anemia, unspecified: Secondary | ICD-10-CM | POA: Insufficient documentation

## 2013-01-05 DIAGNOSIS — R609 Edema, unspecified: Secondary | ICD-10-CM | POA: Insufficient documentation

## 2013-01-05 DIAGNOSIS — I10 Essential (primary) hypertension: Secondary | ICD-10-CM | POA: Insufficient documentation

## 2013-01-05 DIAGNOSIS — D7589 Other specified diseases of blood and blood-forming organs: Secondary | ICD-10-CM | POA: Insufficient documentation

## 2013-01-05 DIAGNOSIS — D696 Thrombocytopenia, unspecified: Secondary | ICD-10-CM

## 2013-01-05 HISTORY — DX: Shortness of breath: R06.02

## 2013-01-05 LAB — APTT: aPTT: 27 seconds (ref 24–37)

## 2013-01-05 LAB — PROTIME-INR
INR: 1.03 (ref 0.00–1.49)
Prothrombin Time: 13.3 seconds (ref 11.6–15.2)

## 2013-01-05 LAB — CBC
Hemoglobin: 11.5 g/dL — ABNORMAL LOW (ref 12.0–15.0)
RBC: 3.64 MIL/uL — ABNORMAL LOW (ref 3.87–5.11)
WBC: 12.6 10*3/uL — ABNORMAL HIGH (ref 4.0–10.5)

## 2013-01-05 MED ORDER — FENTANYL CITRATE 0.05 MG/ML IJ SOLN
INTRAMUSCULAR | Status: AC | PRN
  Administered 2013-01-05: 50 ug via INTRAVENOUS

## 2013-01-05 MED ORDER — FENTANYL CITRATE 0.05 MG/ML IJ SOLN
INTRAMUSCULAR | Status: AC
  Filled 2013-01-05: qty 6

## 2013-01-05 MED ORDER — HYDROCODONE-ACETAMINOPHEN 5-325 MG PO TABS: 1.0000 | ORAL_TABLET | ORAL | Status: DC | PRN

## 2013-01-05 MED ORDER — MIDAZOLAM HCL 2 MG/2ML IJ SOLN
INTRAMUSCULAR | Status: AC | PRN
  Administered 2013-01-05: 1 mg via INTRAVENOUS

## 2013-01-05 MED ORDER — SODIUM CHLORIDE 0.9 % IV SOLN
Freq: Once | INTRAVENOUS | Status: AC
  Administered 2013-01-05: 09:00:00 via INTRAVENOUS

## 2013-01-05 MED ORDER — MIDAZOLAM HCL 2 MG/2ML IJ SOLN
INTRAMUSCULAR | Status: AC
  Filled 2013-01-05: qty 6

## 2013-01-05 NOTE — H&P (Signed)
Ashley Lutz is an 79 y.o. female.   Chief Complaint: "I'm getting a bone marrow biopsy" HPI: Patient with history of anemia and thrombocytopenia presents today for CT guided bone marrow biopsy.  Past Medical History  Diagnosis Date  . Thrombocytopenia 2010  . Hypertension   . Heart murmur   . Wrist fracture, left   . Chronic idiopathic monocytosis   . Enlargement of lymph nodes     H/O small bilateral axillary lymph nodes  . Shortness of breath     with excertion    Past Surgical History  Procedure Laterality Date  . Hemorrhoid surgery    . Bunionectomy    . Soft tissue cyst excision      X 5 occasions--cysts on scalp    History reviewed. No pertinent family history. Social History:  reports that she has never smoked. She does not have any smokeless tobacco history on file. She reports that she does not drink alcohol or use illicit drugs.  Allergies:  Allergies  Allergen Reactions  . Milk-Related Compounds Other (See Comments)    GI intolerance    Current outpatient prescriptions:amLODipine (NORVASC) 5 MG tablet, Take 5 mg by mouth daily., Disp: , Rfl: ;  Multiple Vitamins-Minerals (CENTRUM SILVER PO), Take 1 tablet by mouth daily., Disp: , Rfl: ;  predniSONE (DELTASONE) 20 MG tablet, Take 40 mg by mouth daily., Disp: , Rfl: ;  ramipril (ALTACE) 10 MG capsule, Take 10 mg by mouth daily., Disp: , Rfl: ;  Psyllium (METAMUCIL PO), Take by mouth daily., Disp: , Rfl:  Current facility-administered medications:fentaNYL (SUBLIMAZE) 0.05 MG/ML injection, , , , ;  midazolam (VERSED) 2 MG/2ML injection, , , ,    Results for orders placed during the hospital encounter of 01/05/13 (from the past 48 hour(s))  APTT     Status: None   Collection Time    01/05/13  9:00 AM      Result Value Range   aPTT 27  24 - 37 seconds  CBC     Status: Abnormal   Collection Time    01/05/13  9:00 AM      Result Value Range   WBC 12.6 (*) 4.0 - 10.5 K/uL   RBC 3.64 (*) 3.87 - 5.11 MIL/uL   Hemoglobin 11.5 (*) 12.0 - 15.0 g/dL   HCT 98.1 (*) 19.1 - 47.8 %   MCV 97.3  78.0 - 100.0 fL   MCH 31.6  26.0 - 34.0 pg   MCHC 32.5  30.0 - 36.0 g/dL   RDW 29.5 (*) 62.1 - 30.8 %   Platelets 32 (*) 150 - 400 K/uL   Comment: SPECIMEN CHECKED FOR CLOTS     REPEATED TO VERIFY     PLATELET COUNT CONFIRMED BY SMEAR  PROTIME-INR     Status: None   Collection Time    01/05/13  9:00 AM      Result Value Range   Prothrombin Time 13.3  11.6 - 15.2 seconds   INR 1.03  0.00 - 1.49   No results found.  Review of Systems  Constitutional: Negative for fever and chills.  HENT: Positive for hearing loss.   Respiratory: Positive for shortness of breath. Negative for cough and hemoptysis.   Cardiovascular: Negative for chest pain.  Gastrointestinal: Negative for nausea, vomiting and abdominal pain.       Occ blood in stool noted  Genitourinary: Negative for dysuria.  Musculoskeletal: Negative for back pain.  Neurological: Negative for headaches.   Vitals:  BP 154/91  HR 95  R 22  O2 SATS 97% RA TEMP 98.8 Physical Exam  Constitutional: She is oriented to person, place, and time. She appears well-developed and well-nourished.  Cardiovascular:  Murmur heard. irreg  Respiratory: Effort normal and breath sounds normal.  GI: Soft. Bowel sounds are normal. There is no tenderness.  Musculoskeletal: Normal range of motion. She exhibits edema.  Neurological: She is alert and oriented to person, place, and time.     Assessment/Plan Pt with hx anemia/thrombocytopenia. Plan is for CT guided bone marrow biopsy today. Details/risks of procedure d/w pt/son with their understanding and consent.  ALLRED,D KEVIN 01/05/2013, 10:17 AM

## 2013-01-05 NOTE — Procedures (Signed)
CT guided bone marrow aspirate and biopsy.  No immediate complication. 

## 2013-01-06 ENCOUNTER — Encounter: Payer: Self-pay | Admitting: Oncology

## 2013-01-06 NOTE — Progress Notes (Incomplete)
Patient

## 2013-01-09 ENCOUNTER — Ambulatory Visit (HOSPITAL_BASED_OUTPATIENT_CLINIC_OR_DEPARTMENT_OTHER): Payer: Medicare Other | Admitting: Nurse Practitioner

## 2013-01-09 ENCOUNTER — Telehealth: Payer: Self-pay | Admitting: Oncology

## 2013-01-09 ENCOUNTER — Other Ambulatory Visit: Payer: Self-pay | Admitting: Nurse Practitioner

## 2013-01-09 VITALS — BP 126/83 | HR 84 | Temp 98.1°F | Resp 18 | Ht 61.0 in | Wt 118.8 lb

## 2013-01-09 DIAGNOSIS — I10 Essential (primary) hypertension: Secondary | ICD-10-CM

## 2013-01-09 DIAGNOSIS — D696 Thrombocytopenia, unspecified: Secondary | ICD-10-CM

## 2013-01-09 DIAGNOSIS — D72821 Monocytosis (symptomatic): Secondary | ICD-10-CM

## 2013-01-09 NOTE — Progress Notes (Addendum)
OFFICE PROGRESS NOTE  Interval history:  Ashley Lutz returns as scheduled. She underwent a bone marrow in interventional radiology on 01/05/2013 with findings of a hypercellular marrow with dysmegakaryopoiesis and myeloid hyperplasia. Pathologist commented the findings are suggestive of a myelodysplastic syndrome, specifically refractory thrombocytopenia. Cytogenetic analysis is pending.  She occasionally notes blood with bowel movements. She bruises easily. Bowels moving regularly. Current prednisone dose is 40 mg daily.   Objective: Blood pressure 126/83, pulse 84, temperature 98.1 F (36.7 C), temperature source Oral, resp. rate 18, height 5\' 1"  (1.549 m), weight 118 lb 12.8 oz (53.887 kg).  Lungs are clear. Regular cardiac rhythm. Abdomen soft. No organomegaly. Trace lower leg edema bilaterally. Calves are nontender. Ecchymosis surrounding the bone marrow site right posterior iliac region. Large ecchymosis and several smaller ecchymoses at the right forearm.  Lab Results: Lab Results  Component Value Date   WBC 12.6* 01/05/2013   HGB 11.5* 01/05/2013   HCT 35.4* 01/05/2013   MCV 97.3 01/05/2013   PLT 32* 01/05/2013    Chemistry:    Chemistry      Component Value Date/Time   NA 136 12/01/2012 0355   K 3.2* 12/01/2012 0355   CL 100 12/01/2012 0355   CO2 25 12/01/2012 0355   BUN 20 12/01/2012 0355   CREATININE 0.66 12/01/2012 0355      Component Value Date/Time   CALCIUM 8.7 12/01/2012 0355   ALKPHOS 54 12/01/2012 0355   AST 15 12/01/2012 0355   ALT 12 12/01/2012 0355   BILITOT 0.8 12/01/2012 0355       Studies/Results: Ct Biopsy  01/05/2013   *RADIOLOGY REPORT*  Clinical history:  79 year old with anemia and thrombocytopenia.  PROCEDURE(S): CT GUIDED BONE MARROW ASPIRATES AND BIOPSY  Physician: Rachelle Hora. Henn, MD   Medications: Versed 1 mg, Fentanyl 50 mcg. A radiology nurse monitored the patient for moderate sedation.   Procedure: The procedure was explained to the patient. The risks and  benefits of the procedure were discussed and the patient's questions were addressed.  Informed consent was obtained from the patient. The patient was placed prone on CT scan. Images of the pelvis were obtained. The right side of back was prepped and draped in sterile fashion. The skin and right posterior iliac bone were anesthetized with 1% lidocaine.   Bone needle was directed into the right iliac bone with CT guidance. Two aspirates and one core biopsy obtained with a bone drill.   Findings: Needle placement confirmed within the posterior right iliac bone.  Complications: None   Impression: CT guided bone marrow aspirates and core biopsy.   Original Report Authenticated By: Richarda Overlie, M.D.    Medications: I have reviewed the patient's current medications.  Assessment/Plan:  1. Thrombocytopenia, question chronic ITP. The platelet count returned at 25,000 on 10/31/2012. Prednisone 40 mg daily initiated. Platelet count returned at 66,000 on 11/10/2012. The platelet count has declined despite continuation of prednisone over the past month. 2. History of small bilateral axillary lymph nodes.  3. Hypertension. 4. G2 P2. 5. History of a chronic mild monocytosis. Stable. 6. Weight loss. Question etiology. Weight is stable. 7. Evaluation emergency room with a fecal impaction and urinary retention 12/01/2012 8. CT abdomen 12/01/2012 revealed a 9 mm low-attenuation lesion at the junction of the body and tail of the pancreas with upstream pancreatic ductal dilatation.  Disposition-bone marrow findings are consistent with myelodysplastic syndrome, refractory thrombocytopenia. Cytogenetic analysis is pending.  Dr. Truett Perna recommends discontinuation of prednisone. We provided Ashley Lutz  and her son with a taper schedule.  We will contact her once the cytogenetic analysis is available.  She will return in 2 weeks for labs and in 6 weeks for a followup visit. She and her son understand to contact the  office immediately with any bleeding.  Patient seen with Dr. Truett Perna.   Lonna Cobb ANP/GNP-BC   This was a shared visit with Lonna Cobb  The bone marrow biopsy reveals histologic evidence of myelodysplasia. She does not appear to have ITP based on the bone marrow findings and lack of a response to prednisone. The prednisone will be tapered to off.  I discussed the bone marrow findings with Ashley Lutz and her son. Treatment options are limited if she has myelodysplasia. We are waiting on the bone marrow cytogenetics.  She will be followed with observation for now. She knows to contact us for spontaneous bleeding. Ashley Lutz will return for an office visit in 6 weeks.  Mancel Bale, M.D.

## 2013-01-09 NOTE — Telephone Encounter (Signed)
Gave pt appt for lab and MD on 9/30

## 2013-01-09 NOTE — Patient Instructions (Signed)
Decrease prednisone to 20mg  daily for 5 days, then 10mg  daily for 5 days, then 5mg  daily for 5 days then STOP

## 2013-01-16 ENCOUNTER — Telehealth: Payer: Self-pay | Admitting: Medical Oncology

## 2013-01-16 NOTE — Telephone Encounter (Signed)
I called pt's son Iantha Fallen to inform him that the cytogenetics from the bone marrow 01/05/13 is normal. Dr. Truett Perna is going to continue to keep her under observation and to keep her appointments as scheduled.

## 2013-02-24 ENCOUNTER — Telehealth: Payer: Self-pay | Admitting: Oncology

## 2013-02-24 ENCOUNTER — Ambulatory Visit (HOSPITAL_BASED_OUTPATIENT_CLINIC_OR_DEPARTMENT_OTHER): Payer: Medicare Other | Admitting: Oncology

## 2013-02-24 ENCOUNTER — Other Ambulatory Visit (HOSPITAL_BASED_OUTPATIENT_CLINIC_OR_DEPARTMENT_OTHER): Payer: Medicare Other | Admitting: Lab

## 2013-02-24 VITALS — BP 140/73 | HR 94 | Temp 97.5°F | Resp 18 | Ht 61.0 in | Wt 121.2 lb

## 2013-02-24 DIAGNOSIS — D649 Anemia, unspecified: Secondary | ICD-10-CM

## 2013-02-24 DIAGNOSIS — D709 Neutropenia, unspecified: Secondary | ICD-10-CM

## 2013-02-24 DIAGNOSIS — D696 Thrombocytopenia, unspecified: Secondary | ICD-10-CM

## 2013-02-24 LAB — CBC WITH DIFFERENTIAL/PLATELET
Basophils Absolute: 0 10*3/uL (ref 0.0–0.1)
EOS%: 0.8 % (ref 0.0–7.0)
Eosinophils Absolute: 0 10*3/uL (ref 0.0–0.5)
HCT: 35.5 % (ref 34.8–46.6)
HGB: 11.1 g/dL — ABNORMAL LOW (ref 11.6–15.9)
MCH: 31.2 pg (ref 25.1–34.0)
MCV: 99.7 fL (ref 79.5–101.0)
MONO%: 17.2 % — ABNORMAL HIGH (ref 0.0–14.0)
NEUT#: 1.1 10*3/uL — ABNORMAL LOW (ref 1.5–6.5)
NEUT%: 27.6 % — ABNORMAL LOW (ref 38.4–76.8)
Platelets: 40 10*3/uL — ABNORMAL LOW (ref 145–400)

## 2013-02-24 NOTE — Progress Notes (Signed)
   [facility]    OFFICE PROGRESS NOTE   INTERVAL HISTORY:   She returns as scheduled. She cut one of her toenails earlier today and developed mild bleeding. No other bleeding.  Objective:  Vital signs in last 24 hours:  Blood pressure 140/73, pulse 94, temperature 97.5 F (36.4 C), temperature source Oral, resp. rate 18, height 5\' 1"  (1.549 m), weight 121 lb 3.2 oz (54.976 kg).    HEENT: Small ecchymoses at the left side of the tongue. No active bleeding. No petechia Resp: Lungs clear bilaterally Cardio: Regular rate and rhythm, 2/6 systolic murmur GI: No hepatosplenomegaly Vascular: No leg edema  Skin: Few ecchymoses over the extremities. Minimal bleeding at the right third toe nail.    Lab Results:  Lab Results  Component Value Date   WBC 3.9 02/24/2013   HGB 11.1* 02/24/2013   HCT 35.5 02/24/2013   MCV 99.7 02/24/2013   PLT 40 Large platelets present* 02/24/2013   ANC 1.1   Medications: I have reviewed the patient's current medications.  Assessment/Plan:  1. Thrombocytopenia, question chronic ITP. The platelet count returned at 25,000 on 10/31/2012. Prednisone 40 mg daily initiated. Platelet count returned at 66,000 on 11/10/2012. The platelet count declined despite continuation of prednisone .  Bone marrow biopsy 01/05/2013 revealed a hypercellular marrow with dysplastic changes of the megakaryocytes and myeloid hyperplasia, normal cytogenetics 2. History of small bilateral axillary lymph nodes.  3. Hypertension. 4. G2 P2. 5. History of a chronic mild monocytosis.  6. Weight loss. Question etiology. Weight is stable. 7. Evaluation emergency room with a fecal impaction and urinary retention 12/01/2012 8. CT abdomen 12/01/2012 revealed a 9 mm low-attenuation lesion at the junction of the body and tail of the pancreas with upstream pancreatic ductal dilatation. 9. Anemia/neutropenia-likely secondary to myelodysplasia  Disposition:  She appears  stable from a hematologic standpoint. The plan is to continue an observation approach. She will contact us for bleeding. Ms. Mraz will return for an office visit and CBC in 6 weeks.   Thornton Papas, MD  02/24/2013  4:09 PM

## 2013-02-24 NOTE — Telephone Encounter (Signed)
gv and printed appt sched and avs for ptf or Nov °

## 2013-04-07 ENCOUNTER — Encounter (INDEPENDENT_AMBULATORY_CARE_PROVIDER_SITE_OTHER): Payer: Self-pay

## 2013-04-07 ENCOUNTER — Other Ambulatory Visit (HOSPITAL_BASED_OUTPATIENT_CLINIC_OR_DEPARTMENT_OTHER): Payer: Medicare Other | Admitting: Lab

## 2013-04-07 ENCOUNTER — Telehealth: Payer: Self-pay | Admitting: Oncology

## 2013-04-07 ENCOUNTER — Ambulatory Visit (HOSPITAL_BASED_OUTPATIENT_CLINIC_OR_DEPARTMENT_OTHER): Payer: Medicare Other | Admitting: Nurse Practitioner

## 2013-04-07 VITALS — BP 146/79 | HR 98 | Temp 98.1°F | Resp 18 | Ht 61.0 in | Wt 118.5 lb

## 2013-04-07 DIAGNOSIS — D696 Thrombocytopenia, unspecified: Secondary | ICD-10-CM

## 2013-04-07 DIAGNOSIS — D709 Neutropenia, unspecified: Secondary | ICD-10-CM

## 2013-04-07 DIAGNOSIS — D649 Anemia, unspecified: Secondary | ICD-10-CM

## 2013-04-07 LAB — CBC WITH DIFFERENTIAL/PLATELET
BASO%: 0.7 % (ref 0.0–2.0)
Eosinophils Absolute: 0 10*3/uL (ref 0.0–0.5)
HCT: 35 % (ref 34.8–46.6)
LYMPH%: 49.6 % (ref 14.0–49.7)
MCHC: 32 g/dL (ref 31.5–36.0)
MCV: 94.6 fL (ref 79.5–101.0)
MONO#: 0.8 10*3/uL (ref 0.1–0.9)
MONO%: 14.5 % — ABNORMAL HIGH (ref 0.0–14.0)
NEUT%: 34.7 % — ABNORMAL LOW (ref 38.4–76.8)
Platelets: 30 10*3/uL — ABNORMAL LOW (ref 145–400)
RBC: 3.7 10*6/uL (ref 3.70–5.45)
nRBC: 0 % (ref 0–0)

## 2013-04-07 NOTE — Telephone Encounter (Signed)
gv and printed appt sched and avs forpt for DEc adn Feb 2015

## 2013-04-07 NOTE — Progress Notes (Signed)
OFFICE PROGRESS NOTE  Interval history:  Ashley Lutz returns as scheduled. She denies bleeding. She continues to note easy bruising. She has a good appetite. Energy level has improved since her last visit. No nausea or vomiting.   Objective: Blood pressure 146/79, pulse 98, temperature 98.1 F (36.7 C), temperature source Oral, resp. rate 18, height 5\' 1"  (1.549 m), weight 118 lb 8 oz (53.751 kg).  Oropharynx is without thrush or ulceration. No active bleeding. No petechiae. Lungs clear. Regular cardiac rhythm. 2/6 systolic murmur. Abdomen soft and nontender. No organomegaly. Trace lower leg edema bilaterally. A few ecchymoses over the extremities.  Lab Results: Lab Results  Component Value Date   WBC 5.6 04/07/2013   HGB 11.2* 04/07/2013   HCT 35.0 04/07/2013   MCV 94.6 04/07/2013   PLT 30* 04/07/2013    Chemistry:    Chemistry      Component Value Date/Time   NA 136 12/01/2012 0355   K 3.2* 12/01/2012 0355   CL 100 12/01/2012 0355   CO2 25 12/01/2012 0355   BUN 20 12/01/2012 0355   CREATININE 0.66 12/01/2012 0355      Component Value Date/Time   CALCIUM 8.7 12/01/2012 0355   ALKPHOS 54 12/01/2012 0355   AST 15 12/01/2012 0355   ALT 12 12/01/2012 0355   BILITOT 0.8 12/01/2012 0355       Studies/Results: No results found.  Medications: I have reviewed the patient's current medications.  Assessment/Plan:  1. Thrombocytopenia, question chronic ITP. The platelet count returned at 25,000 on 10/31/2012. Prednisone 40 mg daily initiated. Platelet count returned at 66,000 on 11/10/2012. The platelet count declined despite continuation of prednisone . Bone marrow biopsy 01/05/2013 revealed a hypercellular marrow with dysplastic changes of the megakaryocytes and myeloid hyperplasia, normal cytogenetics. 2. History of small bilateral axillary lymph nodes.  3. Hypertension. 4. G2 P2. 5. History of a chronic mild monocytosis.  6. Weight loss. Question etiology. Weight is  stable. 7. Evaluation emergency room with a fecal impaction and urinary retention 12/01/2012 8. CT abdomen 12/01/2012 revealed a 9 mm low-attenuation lesion at the junction of the body and tail of the pancreas with upstream pancreatic ductal dilatation. 9. Anemia/neutropenia-likely secondary to myelodysplasia  Disposition-blood counts remain stable. Plan to continue an observation approach. She will return for a lab visit in 6 weeks and a followup visit in 3 months. She will contact the office prior to her next visit with any bleeding.  Plan reviewed with Dr. Truett Perna.  Ashley Lutz ANP/GNP-BC

## 2013-04-17 ENCOUNTER — Encounter: Payer: Self-pay | Admitting: Interventional Cardiology

## 2013-04-21 ENCOUNTER — Encounter (HOSPITAL_COMMUNITY): Payer: Self-pay

## 2013-05-18 ENCOUNTER — Telehealth: Payer: Self-pay | Admitting: Oncology

## 2013-05-18 NOTE — Telephone Encounter (Signed)
r/s from 12/23 by pt's son, lab appt

## 2013-05-19 ENCOUNTER — Other Ambulatory Visit: Payer: Medicare Other

## 2013-06-08 ENCOUNTER — Other Ambulatory Visit (HOSPITAL_BASED_OUTPATIENT_CLINIC_OR_DEPARTMENT_OTHER): Payer: Medicare HMO

## 2013-06-08 ENCOUNTER — Encounter (INDEPENDENT_AMBULATORY_CARE_PROVIDER_SITE_OTHER): Payer: Self-pay

## 2013-06-08 DIAGNOSIS — D696 Thrombocytopenia, unspecified: Secondary | ICD-10-CM

## 2013-06-08 LAB — CBC WITH DIFFERENTIAL/PLATELET
BASO%: 1.7 % (ref 0.0–2.0)
Basophils Absolute: 0.1 10*3/uL (ref 0.0–0.1)
EOS ABS: 0 10*3/uL (ref 0.0–0.5)
EOS%: 0.7 % (ref 0.0–7.0)
HCT: 34.1 % — ABNORMAL LOW (ref 34.8–46.6)
HGB: 10.9 g/dL — ABNORMAL LOW (ref 11.6–15.9)
LYMPH%: 42.8 % (ref 14.0–49.7)
MCH: 29.6 pg (ref 25.1–34.0)
MCHC: 32 g/dL (ref 31.5–36.0)
MCV: 92.7 fL (ref 79.5–101.0)
MONO#: 0.9 10*3/uL (ref 0.1–0.9)
MONO%: 16.5 % — ABNORMAL HIGH (ref 0.0–14.0)
NEUT%: 38.3 % — ABNORMAL LOW (ref 38.4–76.8)
NEUTROS ABS: 2.1 10*3/uL (ref 1.5–6.5)
NRBC: 0 % (ref 0–0)
PLATELETS: 41 10*3/uL — AB (ref 145–400)
RBC: 3.68 10*6/uL — AB (ref 3.70–5.45)
RDW: 17.9 % — AB (ref 11.2–14.5)
WBC: 5.4 10*3/uL (ref 3.9–10.3)
lymph#: 2.3 10*3/uL (ref 0.9–3.3)

## 2013-06-08 LAB — TECHNOLOGIST REVIEW

## 2013-07-07 ENCOUNTER — Telehealth: Payer: Self-pay | Admitting: Oncology

## 2013-07-07 ENCOUNTER — Ambulatory Visit (HOSPITAL_BASED_OUTPATIENT_CLINIC_OR_DEPARTMENT_OTHER): Payer: Commercial Managed Care - HMO | Admitting: Oncology

## 2013-07-07 ENCOUNTER — Other Ambulatory Visit (HOSPITAL_BASED_OUTPATIENT_CLINIC_OR_DEPARTMENT_OTHER): Payer: Medicare HMO

## 2013-07-07 ENCOUNTER — Telehealth: Payer: Self-pay | Admitting: *Deleted

## 2013-07-07 VITALS — BP 154/66 | HR 91 | Temp 97.7°F | Resp 18 | Ht 61.0 in | Wt 114.4 lb

## 2013-07-07 DIAGNOSIS — I10 Essential (primary) hypertension: Secondary | ICD-10-CM

## 2013-07-07 DIAGNOSIS — D649 Anemia, unspecified: Secondary | ICD-10-CM

## 2013-07-07 DIAGNOSIS — D696 Thrombocytopenia, unspecified: Secondary | ICD-10-CM

## 2013-07-07 LAB — CBC WITH DIFFERENTIAL/PLATELET
BASO%: 1.7 % (ref 0.0–2.0)
BASOS ABS: 0.1 10*3/uL (ref 0.0–0.1)
EOS ABS: 0.1 10*3/uL (ref 0.0–0.5)
EOS%: 1 % (ref 0.0–7.0)
HCT: 33.7 % — ABNORMAL LOW (ref 34.8–46.6)
HEMOGLOBIN: 10.7 g/dL — AB (ref 11.6–15.9)
LYMPH#: 2.3 10*3/uL (ref 0.9–3.3)
LYMPH%: 48.6 % (ref 14.0–49.7)
MCH: 29.4 pg (ref 25.1–34.0)
MCHC: 31.8 g/dL (ref 31.5–36.0)
MCV: 92.6 fL (ref 79.5–101.0)
MONO#: 0.6 10*3/uL (ref 0.1–0.9)
MONO%: 12.9 % (ref 0.0–14.0)
NEUT%: 35.8 % — ABNORMAL LOW (ref 38.4–76.8)
NEUTROS ABS: 1.7 10*3/uL (ref 1.5–6.5)
Platelets: 39 10*3/uL — ABNORMAL LOW (ref 145–400)
RBC: 3.64 10*6/uL — ABNORMAL LOW (ref 3.70–5.45)
RDW: 17.2 % — AB (ref 11.2–14.5)
WBC: 4.8 10*3/uL (ref 3.9–10.3)
nRBC: 0 % (ref 0–0)

## 2013-07-07 NOTE — Telephone Encounter (Signed)
Gave pt appt for lab and MD for May 2015 °

## 2013-07-07 NOTE — Progress Notes (Signed)
   [facility]    OFFICE PROGRESS NOTE   INTERVAL HISTORY:   She returns as scheduled. She feels well. Good appetite. Ms. Palecek bruises easily. No other bleeding.  Objective:  Vital signs in last 24 hours:  Blood pressure 154/66, pulse 91, temperature 97.7 F (36.5 C), temperature source Oral, resp. rate 18, height _0  (1.549 m), weight 114 lb 6.4 oz (51.891 kg).    HEENT: No thrush or bleeding Resp: Lungs clear bilaterally Cardio: Regular rate and rhythm GI: No hepatosplenomegaly Vascular: Chronic stasis change at the lower leg bilaterally  Skin: Few small ecchymoses at the dorsum of the forearms and hands   Lab Results:  Lab Results  Component Value Date   WBC 4.8 07/07/2013   HGB 10.7* 07/07/2013   HCT 33.7* 07/07/2013   MCV 92.6 07/07/2013   PLT 39* 07/07/2013   NEUTROABS 1.7 07/07/2013      Medications: I have reviewed the patient's current medications.  Assessment/Plan: 1. Thrombocytopenia, question chronic ITP. The platelet count returned at 25,000 on 10/31/2012. Prednisone 40 mg daily initiated. Platelet count returned at 66,000 on 11/10/2012. The platelet count declined despite continuation of prednisone . Bone marrow biopsy 01/05/2013 revealed a hypercellular marrow with dysplastic changes of the megakaryocytes and myeloid hyperplasia, normal cytogenetics. 2. History of small bilateral axillary lymph nodes.  3. Hypertension. 4. G2 P2. 5. History of a chronic mild monocytosis.  6. Weight loss. Question etiology. Weight is stable. 7. Evaluation emergency room with a fecal impaction and urinary retention 12/01/2012 8. CT abdomen 12/01/2012 revealed a 9 mm low-attenuation lesion at the junction of the body and tail of the pancreas with upstream pancreatic ductal dilatation. 9. Anemia/neutropenia-likely secondary to myelodysplasia   Disposition:  Ms. Townsel is stable from a hematologic standpoint. We will continue following her with  observation. The anemia/thrombocytopenia are likely related to myelodysplasia. She will return for an office visit and CBC in 3 months.   Betsy Coder, MD  07/07/2013  4:55 PM

## 2013-07-07 NOTE — Telephone Encounter (Signed)
Received call from [place] @ Dr. Christiane Ha Stoneking's office wanting to know diagnosis code for pt's visit with Dr.Sherrill - due to pt's insurance has been changed to [facility].   Dora requested office notes from November to be faxed to Dr. Carlyle Lipa office. Dora's  Phone     [phone]  Ext    [phone]    ;    Fax       [phone].

## 2013-07-09 ENCOUNTER — Telehealth: Payer: Self-pay | Admitting: *Deleted

## 2013-07-09 NOTE — Telephone Encounter (Signed)
Faxed office notes from 04/07/13  And  07/07/13  To Dora @ Dr. Christiane Ha Stoneking's office.  Called Dora and informed her of above info.

## 2013-08-04 ENCOUNTER — Ambulatory Visit: Payer: Medicare Other | Admitting: Interventional Cardiology

## 2013-09-02 ENCOUNTER — Inpatient Hospital Stay (HOSPITAL_COMMUNITY)
Admission: EM | Admit: 2013-09-02 | Discharge: 2013-09-10 | DRG: 956 | Disposition: A | Discharge status: Skilled Nursing Facility | Payer: Medicare HMO | Attending: Internal Medicine | Admitting: Internal Medicine

## 2013-09-02 ENCOUNTER — Encounter (HOSPITAL_COMMUNITY): Payer: Self-pay | Admitting: Emergency Medicine

## 2013-09-02 DIAGNOSIS — R011 Cardiac murmur, unspecified: Secondary | ICD-10-CM | POA: Diagnosis present

## 2013-09-02 DIAGNOSIS — M25579 Pain in unspecified ankle and joints of unspecified foot: Secondary | ICD-10-CM | POA: Diagnosis not present

## 2013-09-02 DIAGNOSIS — S065XAA Traumatic subdural hemorrhage with loss of consciousness status unknown, initial encounter: Secondary | ICD-10-CM | POA: Diagnosis present

## 2013-09-02 DIAGNOSIS — S065X9A Traumatic subdural hemorrhage with loss of consciousness of unspecified duration, initial encounter: Secondary | ICD-10-CM | POA: Diagnosis present

## 2013-09-02 DIAGNOSIS — S72033A Displaced midcervical fracture of unspecified femur, initial encounter for closed fracture: Secondary | ICD-10-CM | POA: Diagnosis present

## 2013-09-02 DIAGNOSIS — Z681 Body mass index (BMI) 19 or less, adult: Secondary | ICD-10-CM

## 2013-09-02 DIAGNOSIS — D6959 Other secondary thrombocytopenia: Secondary | ICD-10-CM | POA: Diagnosis present

## 2013-09-02 DIAGNOSIS — W19XXXA Unspecified fall, initial encounter: Secondary | ICD-10-CM | POA: Diagnosis present

## 2013-09-02 DIAGNOSIS — R636 Underweight: Secondary | ICD-10-CM | POA: Diagnosis present

## 2013-09-02 DIAGNOSIS — D693 Immune thrombocytopenic purpura: Secondary | ICD-10-CM

## 2013-09-02 DIAGNOSIS — Z66 Do not resuscitate: Secondary | ICD-10-CM | POA: Diagnosis not present

## 2013-09-02 DIAGNOSIS — D469 Myelodysplastic syndrome, unspecified: Secondary | ICD-10-CM | POA: Diagnosis present

## 2013-09-02 DIAGNOSIS — D72829 Elevated white blood cell count, unspecified: Secondary | ICD-10-CM | POA: Diagnosis not present

## 2013-09-02 DIAGNOSIS — E43 Unspecified severe protein-calorie malnutrition: Secondary | ICD-10-CM | POA: Diagnosis present

## 2013-09-02 DIAGNOSIS — R7309 Other abnormal glucose: Secondary | ICD-10-CM | POA: Diagnosis not present

## 2013-09-02 DIAGNOSIS — D696 Thrombocytopenia, unspecified: Secondary | ICD-10-CM | POA: Diagnosis present

## 2013-09-02 DIAGNOSIS — I10 Essential (primary) hypertension: Secondary | ICD-10-CM | POA: Diagnosis present

## 2013-09-02 DIAGNOSIS — I358 Other nonrheumatic aortic valve disorders: Secondary | ICD-10-CM | POA: Diagnosis present

## 2013-09-02 DIAGNOSIS — J9600 Acute respiratory failure, unspecified whether with hypoxia or hypercapnia: Secondary | ICD-10-CM | POA: Diagnosis present

## 2013-09-02 DIAGNOSIS — J9601 Acute respiratory failure with hypoxia: Secondary | ICD-10-CM

## 2013-09-02 DIAGNOSIS — I359 Nonrheumatic aortic valve disorder, unspecified: Secondary | ICD-10-CM | POA: Diagnosis present

## 2013-09-02 DIAGNOSIS — I350 Nonrheumatic aortic (valve) stenosis: Secondary | ICD-10-CM | POA: Diagnosis present

## 2013-09-02 DIAGNOSIS — D62 Acute posthemorrhagic anemia: Secondary | ICD-10-CM | POA: Diagnosis not present

## 2013-09-02 DIAGNOSIS — S72001A Fracture of unspecified part of neck of right femur, initial encounter for closed fracture: Secondary | ICD-10-CM

## 2013-09-02 DIAGNOSIS — D638 Anemia in other chronic diseases classified elsewhere: Secondary | ICD-10-CM | POA: Diagnosis present

## 2013-09-02 DIAGNOSIS — S72009A Fracture of unspecified part of neck of unspecified femur, initial encounter for closed fracture: Secondary | ICD-10-CM

## 2013-09-02 MED ORDER — FENTANYL CITRATE 0.05 MG/ML IJ SOLN
50.0000 ug | Freq: Once | INTRAMUSCULAR | Status: AC
  Administered 2013-09-03: 50 ug via INTRAVENOUS
  Filled 2013-09-02: qty 2

## 2013-09-02 MED ORDER — SODIUM CHLORIDE 0.9 % IV SOLN
Freq: Once | INTRAVENOUS | Status: AC
  Administered 2013-09-02: 1000 mL via INTRAVENOUS

## 2013-09-02 NOTE — ED Provider Notes (Signed)
CSN: [id number]     Arrival date & time 09/02/13  2313 History   First MD Initiated Contact with Patient 09/02/13 2316     Chief Complaint  Patient presents with  . Fall  . Hip Pain     (Consider location/radiation/quality/duration/timing/severity/associated sxs/prior Treatment) HPI Comments: Patient reports she stood from her chair and fell, landing on her R hip  Now with pain external rotation and shortening  Was transported by EMS  No previous Hx of hip injury  Denies LOC or other injury but does have a small contusion on the bridge of her nose from her glasses  Patient is a 80 y.o. female presenting with fall and hip pain. The history is provided by the patient.  Fall This is a new problem. The current episode started today. The problem occurs constantly. The problem has been unchanged. Associated symptoms include arthralgias. Pertinent negatives include no chest pain, chills, fever, headaches, nausea, numbness or weakness. The symptoms are aggravated by exertion. She has tried nothing for the symptoms. The treatment provided no relief.  Hip Pain Associated symptoms include arthralgias. Pertinent negatives include no chest pain, chills, fever, headaches, nausea, numbness or weakness.    Past Medical History  Diagnosis Date  . Thrombocytopenia 2010  . Hypertension   . Heart murmur   . Wrist fracture, left   . Chronic idiopathic monocytosis   . Enlargement of lymph nodes     H/O small bilateral axillary lymph nodes  . Shortness of breath     with excertion   Past Surgical History  Procedure Laterality Date  . Hemorrhoid surgery    . Bunionectomy    . Soft tissue cyst excision      X 5 occasions--cysts on scalp   History reviewed. No pertinent family history. History  Substance Use Topics  . Smoking status: Never Smoker   . Smokeless tobacco: Not on file  . Alcohol Use: No   OB History   Grav Para Term Preterm Abortions TAB SAB Ect Mult Living                  Review of Systems  Constitutional: Negative for fever and chills.  Eyes: Negative for visual disturbance.  Cardiovascular: Negative for chest pain and leg swelling.  Gastrointestinal: Negative for nausea.  Musculoskeletal: Positive for arthralgias.  Skin: Negative for wound.  Neurological: Negative for dizziness, weakness, numbness and headaches.  All other systems reviewed and are negative.     Allergies  Milk-related compounds  Home Medications   Current Outpatient Rx  Name  Route  Sig  Dispense  Refill  . amLODipine (NORVASC) 5 MG tablet   Oral   Take 5 mg by mouth daily.         . calcium-vitamin D (OSCAL WITH D) 250-125 MG-UNIT per tablet   Oral   Take 1 tablet by mouth daily.         . Multiple Vitamins-Minerals (CENTRUM SILVER PO)   Oral   Take 1 tablet by mouth daily.         . ramipril (ALTACE) 10 MG capsule   Oral   Take 10 mg by mouth daily.          BP 139/72  Pulse 91  Temp(Src) 98.3 F (36.8 C) (Oral)  Resp 18  SpO2 95% Physical Exam  Vitals reviewed. Constitutional: She appears well-developed and well-nourished.  HENT:  Head: Normocephalic.  Eyes: Pupils are equal, round, and reactive to light.  Neck: Normal  range of motion.  Cardiovascular: Normal rate.   Musculoskeletal: Normal range of motion.  Neurological: She is alert.  Skin: Skin is warm. No erythema.    ED Course  Procedures (including critical care time) Labs Review Labs Reviewed  CBC - Abnormal; Notable for the following:    RBC 3.51 (*)    Hemoglobin 10.6 (*)    HCT 32.6 (*)    RDW 17.7 (*)    Platelets 21 (*)    All other components within normal limits  I-STAT CHEM 8, ED - Abnormal; Notable for the following:    Glucose, Bld 116 (*)    Hemoglobin 11.6 (*)    HCT 34.0 (*)    All other components within normal limits  PROTIME-INR  TYPE AND SCREEN  ABO/RH   Imaging Review Dg Chest 1 View  09/03/2013   CLINICAL DATA:  Fall.  EXAM: CHEST - 1 VIEW   COMPARISON:  12/01/2012  FINDINGS: Lungs are adequately inflated with mild prominence of the perihilar markings likely mild vascular congestion. Borderline cardiomegaly. There is calcified plaque over the thoracic aorta. There are degenerate changes spinal bowel curvature of the thoracic spine convex to the right.  IMPRESSION: Borderline cardiomegaly and suggestion of mild vascular congestion.   Electronically Signed   By: Marin Olp M.D.   On: 09/03/2013 01:00   Dg Hip Complete Right  09/03/2013   CLINICAL DATA:  Fall.  EXAM: RIGHT HIP - COMPLETE 2+ VIEW  COMPARISON:  12/01/2012  FINDINGS: There is diffuse decreased bone mineralization. There are symmetric mild-to-moderate degenerative changes of the hips. There is a mildly displaced subcapital fracture of the right femoral neck. There are degenerative changes of the spine. Calcified plaque is present over the iliac and femoral arteries.  IMPRESSION: Displaced subcapital fracture of the right femoral neck.   Electronically Signed   By: Marin Olp M.D.   On: 09/03/2013 00:58   Ct Head Wo Contrast  09/03/2013   ADDENDUM REPORT: 09/03/2013 03:14  ADDENDUM: Critical Value/emergent results were called by telephone at the time of interpretation on 09/03/2013 at 3:13 AM to [facility] , who verbally acknowledged these results.   Electronically Signed   By: Elon Alas   On: 09/03/2013 03:14   09/03/2013   CLINICAL DATA:  Fall.  EXAM: CT HEAD WITHOUT CONTRAST  TECHNIQUE: Contiguous axial images were obtained from the base of the skull through the vertex without intravenous contrast.  COMPARISON:  None available for comparison at time of study interpretation.  IMPRESSION: The ventricles and sulci are normal for age. No intraparenchymal hemorrhage, mass effect nor midline shift. Patchy to confluent supratentorial white matter hypodensities are within normal range for patient's age and though non-specific suggest sequelae of chronic small vessel ischemic  disease. No acute large vascular territory infarcts.  Mild hyperdensity along the left cerebellar tentorium consistent with small subdural hematoma. 2 mm left parafalcine subdural hematoma with apparent trace left mesial frontal subarachnoid blood, axial 21/32. 4 mm right temporal subdural hematoma, axial 15/32. Basal cisterns are patent. Moderate calcific atherosclerosis of the carotid siphons.  Apparent small right temporal scalp hematoma versus redundant skin. No skull fracture. Visualized paranasal sinuses and mastoid aircells are well-aerated. The included ocular globes and orbital contents are non-suspicious. Status post bilateral ocular lens implants. Severe temporal mandibular osteoarthrosis.  Small left cerebellar tentorium, left parafalcine and right temporal subdural hematomas. Possible trace left mesial frontal subarachnoid hemorrhage. No skull fracture.  No intraparenchymal hemorrhage. Involutional changes. Moderate white matter  changes suggest chronic small vessel ischemic disease.  Electronically Signed: By: Elon Alas On: 09/03/2013 03:09     EKG Interpretation None      MDM  Extra reviewed.  Patient has a subcapital right femoral neck fracture.  She also has a history of thrombus cytopenia.  Tonight.  Platelet count is 21 thousand.  She is followed by a mom for this.  I spoke with the hospitalist for admission.  He is requesting a head CT.  Despite the fact that there is no obvious head trauma-the head CT result shows 2 small punctate areas of subdural hematoma.  This has been discussed with Dr. Barry Dienes from trauma, who will calm, assessed.  The patient, as well as Dr. Ronnald Ramp.  Neurosurgery, who does not feel that there is anything that needs to be done acutely.  He will assess the patient prior to hip surgery in the morning Final diagnoses:  Fracture of hip, right, closed         Garald Balding, NP 09/03/13 0231  Garald Balding, NP 09/09/13 1956

## 2013-09-02 NOTE — ED Notes (Addendum)
Pt reports to the ED via [facility] following a fall. She stood out of her chair and fell out of her chair and landed on the right side. Denies any LOC or blood thinners. She does have a hx of a clotting disorder. Episode occurred at approx 2145. Pt complaining of right hip pain. She took 2 Tylenols with minimal relief. Pt reports she does not know how she fell. CMS intact. Full ROM limited by pain. Bruising noted to the bridge of her nose and the right side of her head. Slight shortening noted to the right leg. Pt A&Ox4, resp e/u, and skin warm and dry.

## 2013-09-03 ENCOUNTER — Encounter (HOSPITAL_COMMUNITY)
Admission: EM | Disposition: A | Discharge status: Skilled Nursing Facility | Payer: Self-pay | Source: Home / Self Care | Attending: Internal Medicine

## 2013-09-03 ENCOUNTER — Emergency Department (HOSPITAL_COMMUNITY): Payer: Medicare HMO

## 2013-09-03 ENCOUNTER — Inpatient Hospital Stay (HOSPITAL_COMMUNITY): Payer: Medicare HMO

## 2013-09-03 ENCOUNTER — Encounter (HOSPITAL_COMMUNITY): Payer: Self-pay | Admitting: Neurological Surgery

## 2013-09-03 ENCOUNTER — Other Ambulatory Visit (INDEPENDENT_AMBULATORY_CARE_PROVIDER_SITE_OTHER): Payer: Self-pay | Admitting: General Surgery

## 2013-09-03 DIAGNOSIS — D693 Immune thrombocytopenic purpura: Secondary | ICD-10-CM

## 2013-09-03 DIAGNOSIS — S065XAA Traumatic subdural hemorrhage with loss of consciousness status unknown, initial encounter: Secondary | ICD-10-CM | POA: Diagnosis present

## 2013-09-03 DIAGNOSIS — I358 Other nonrheumatic aortic valve disorders: Secondary | ICD-10-CM | POA: Diagnosis present

## 2013-09-03 DIAGNOSIS — E43 Unspecified severe protein-calorie malnutrition: Secondary | ICD-10-CM | POA: Diagnosis present

## 2013-09-03 DIAGNOSIS — S069XAA Unspecified intracranial injury with loss of consciousness status unknown, initial encounter: Secondary | ICD-10-CM

## 2013-09-03 DIAGNOSIS — D469 Myelodysplastic syndrome, unspecified: Secondary | ICD-10-CM | POA: Diagnosis present

## 2013-09-03 DIAGNOSIS — I509 Heart failure, unspecified: Secondary | ICD-10-CM

## 2013-09-03 DIAGNOSIS — S72001A Fracture of unspecified part of neck of right femur, initial encounter for closed fracture: Secondary | ICD-10-CM | POA: Diagnosis present

## 2013-09-03 DIAGNOSIS — I350 Nonrheumatic aortic (valve) stenosis: Secondary | ICD-10-CM | POA: Diagnosis present

## 2013-09-03 DIAGNOSIS — S72009A Fracture of unspecified part of neck of unspecified femur, initial encounter for closed fracture: Secondary | ICD-10-CM

## 2013-09-03 DIAGNOSIS — I359 Nonrheumatic aortic valve disorder, unspecified: Secondary | ICD-10-CM

## 2013-09-03 DIAGNOSIS — J9600 Acute respiratory failure, unspecified whether with hypoxia or hypercapnia: Secondary | ICD-10-CM

## 2013-09-03 DIAGNOSIS — S065X9A Traumatic subdural hemorrhage with loss of consciousness of unspecified duration, initial encounter: Secondary | ICD-10-CM | POA: Diagnosis present

## 2013-09-03 DIAGNOSIS — S069X9A Unspecified intracranial injury with loss of consciousness of unspecified duration, initial encounter: Secondary | ICD-10-CM

## 2013-09-03 DIAGNOSIS — J9601 Acute respiratory failure with hypoxia: Secondary | ICD-10-CM | POA: Diagnosis present

## 2013-09-03 LAB — COMPREHENSIVE METABOLIC PANEL
ALBUMIN: 3.7 g/dL (ref 3.5–5.2)
ALT: 12 U/L (ref 0–35)
AST: 27 U/L (ref 0–37)
Alkaline Phosphatase: 63 U/L (ref 39–117)
BILIRUBIN TOTAL: 0.5 mg/dL (ref 0.3–1.2)
BUN: 14 mg/dL (ref 6–23)
CALCIUM: 8.9 mg/dL (ref 8.4–10.5)
CHLORIDE: 97 meq/L (ref 96–112)
CO2: 27 mEq/L (ref 19–32)
CREATININE: 0.6 mg/dL (ref 0.50–1.10)
GFR calc Af Amer: 88 mL/min — ABNORMAL LOW (ref >90)
GFR calc non Af Amer: 76 mL/min — ABNORMAL LOW (ref >90)
Glucose, Bld: 141 mg/dL — ABNORMAL HIGH (ref 70–99)
Potassium: 3.4 mEq/L — ABNORMAL LOW (ref 3.7–5.3)
Sodium: 139 mEq/L (ref 137–147)
Total Protein: 8 g/dL (ref 6.0–8.3)

## 2013-09-03 LAB — CBC WITH DIFFERENTIAL/PLATELET
BASOS PCT: 0 % (ref 0–1)
Basophils Absolute: 0 10*3/uL (ref 0.0–0.1)
EOS ABS: 0 10*3/uL (ref 0.0–0.7)
EOS PCT: 0 % (ref 0–5)
HEMATOCRIT: 32 % — AB (ref 36.0–46.0)
HEMOGLOBIN: 10.4 g/dL — AB (ref 12.0–15.0)
Lymphocytes Relative: 8 % — ABNORMAL LOW (ref 12–46)
Lymphs Abs: 1 10*3/uL (ref 0.7–4.0)
MCH: 30 pg (ref 26.0–34.0)
MCHC: 32.5 g/dL (ref 30.0–36.0)
MCV: 92.2 fL (ref 78.0–100.0)
MONO ABS: 1.1 10*3/uL — AB (ref 0.1–1.0)
MONOS PCT: 9 % (ref 3–12)
NEUTROS ABS: 10.3 10*3/uL — AB (ref 1.7–7.7)
Neutrophils Relative %: 83 % — ABNORMAL HIGH (ref 43–77)
Platelets: 59 10*3/uL — ABNORMAL LOW (ref 150–400)
RBC: 3.47 MIL/uL — ABNORMAL LOW (ref 3.87–5.11)
RDW: 17.4 % — AB (ref 11.5–15.5)
WBC: 12.4 10*3/uL — ABNORMAL HIGH (ref 4.0–10.5)

## 2013-09-03 LAB — CBC
HEMATOCRIT: 32.6 % — AB (ref 36.0–46.0)
Hemoglobin: 10.6 g/dL — ABNORMAL LOW (ref 12.0–15.0)
MCH: 30.2 pg (ref 26.0–34.0)
MCHC: 32.5 g/dL (ref 30.0–36.0)
MCV: 92.9 fL (ref 78.0–100.0)
PLATELETS: 21 10*3/uL — AB (ref 150–400)
RBC: 3.51 MIL/uL — AB (ref 3.87–5.11)
RDW: 17.7 % — ABNORMAL HIGH (ref 11.5–15.5)
WBC: 8.3 10*3/uL (ref 4.0–10.5)

## 2013-09-03 LAB — BLOOD GAS, ARTERIAL
Acid-Base Excess: 1 mmol/L (ref 0.0–2.0)
Bicarbonate: 24.5 mEq/L — ABNORMAL HIGH (ref 20.0–24.0)
DRAWN BY: 36496
O2 CONTENT: 5 L/min
O2 Saturation: 92.1 %
Patient temperature: 98.6
TCO2: 25.6 mmol/L (ref 0–100)
pCO2 arterial: 35.4 mmHg (ref 35.0–45.0)
pH, Arterial: 7.454 — ABNORMAL HIGH (ref 7.350–7.450)
pO2, Arterial: 64.3 mmHg — ABNORMAL LOW (ref 80.0–100.0)

## 2013-09-03 LAB — PROTIME-INR
INR: 1.14 (ref 0.00–1.49)
Prothrombin Time: 14.4 seconds (ref 11.6–15.2)

## 2013-09-03 LAB — I-STAT CHEM 8, ED
BUN: 18 mg/dL (ref 6–23)
CALCIUM ION: 1.16 mmol/L (ref 1.13–1.30)
CHLORIDE: 104 meq/L (ref 96–112)
Creatinine, Ser: 0.8 mg/dL (ref 0.50–1.10)
GLUCOSE: 116 mg/dL — AB (ref 70–99)
HCT: 34 % — ABNORMAL LOW (ref 36.0–46.0)
Hemoglobin: 11.6 g/dL — ABNORMAL LOW (ref 12.0–15.0)
POTASSIUM: 3.8 meq/L (ref 3.7–5.3)
Sodium: 141 mEq/L (ref 137–147)
TCO2: 25 mmol/L (ref 0–100)

## 2013-09-03 LAB — MRSA PCR SCREENING: MRSA by PCR: NEGATIVE

## 2013-09-03 LAB — ABO/RH: ABO/RH(D): O POS

## 2013-09-03 SURGERY — HEMIARTHROPLASTY, HIP, DIRECT ANTERIOR APPROACH, FOR FRACTURE
Anesthesia: General | Laterality: Right

## 2013-09-03 MED ORDER — PROPOFOL 10 MG/ML IV BOLUS
INTRAVENOUS | Status: AC
  Filled 2013-09-03: qty 20

## 2013-09-03 MED ORDER — ONDANSETRON HCL 4 MG/2ML IJ SOLN
4.0000 mg | Freq: Four times a day (QID) | INTRAMUSCULAR | Status: DC | PRN
  Administered 2013-09-03 – 2013-09-06 (×4): 4 mg via INTRAVENOUS
  Filled 2013-09-03 (×4): qty 2

## 2013-09-03 MED ORDER — CEFAZOLIN SODIUM-DEXTROSE 2-3 GM-% IV SOLR
2.0000 g | INTRAVENOUS | Status: AC
  Administered 2013-09-03: 2 g via INTRAVENOUS
  Filled 2013-09-03: qty 50

## 2013-09-03 MED ORDER — ENSURE COMPLETE PO LIQD
237.0000 mL | Freq: Two times a day (BID) | ORAL | Status: DC
  Administered 2013-09-05 – 2013-09-08 (×5): 237 mL via ORAL
  Filled 2013-09-03: qty 237

## 2013-09-03 MED ORDER — BISACODYL 10 MG RE SUPP: 10.0000 mg | Freq: Every day | RECTAL | Status: DC | PRN

## 2013-09-03 MED ORDER — HYDROCODONE-ACETAMINOPHEN 5-325 MG PO TABS: 1.0000 | ORAL_TABLET | Freq: Four times a day (QID) | ORAL | Status: DC | PRN

## 2013-09-03 MED ORDER — FENTANYL CITRATE 0.05 MG/ML IJ SOLN
50.0000 ug | Freq: Once | INTRAMUSCULAR | Status: AC
  Administered 2013-09-03: 50 ug via INTRAVENOUS
  Filled 2013-09-03: qty 2

## 2013-09-03 MED ORDER — MORPHINE SULFATE 2 MG/ML IJ SOLN
0.5000 mg | INTRAMUSCULAR | Status: DC | PRN
  Administered 2013-09-03 (×4): 0.5 mg via INTRAVENOUS
  Filled 2013-09-03 (×4): qty 1

## 2013-09-03 MED ORDER — SENNA 8.6 MG PO TABS
1.0000 | ORAL_TABLET | Freq: Two times a day (BID) | ORAL | Status: DC
  Administered 2013-09-03 – 2013-09-10 (×11): 8.6 mg via ORAL
  Filled 2013-09-03 (×17): qty 1

## 2013-09-03 MED ORDER — FENTANYL CITRATE 0.05 MG/ML IJ SOLN
INTRAMUSCULAR | Status: AC
  Filled 2013-09-03: qty 5

## 2013-09-03 MED ORDER — FUROSEMIDE 10 MG/ML IJ SOLN
40.0000 mg | Freq: Once | INTRAMUSCULAR | Status: AC
  Administered 2013-09-03: 40 mg via INTRAVENOUS
  Filled 2013-09-03: qty 4

## 2013-09-03 MED ORDER — METHYLPREDNISOLONE SODIUM SUCC 40 MG IJ SOLR
30.0000 mg | INTRAMUSCULAR | Status: DC
  Administered 2013-09-03 – 2013-09-06 (×4): 30 mg via INTRAVENOUS
  Filled 2013-09-03 (×6): qty 0.75

## 2013-09-03 SURGICAL SUPPLY — 54 items
BENZOIN TINCTURE PRP APPL 2/3 (GAUZE/BANDAGES/DRESSINGS) ×3 IMPLANT
BLADE SAW SGTL 18.5X63.X.64 HD (BLADE) ×3 IMPLANT
BRUSH FEMORAL CANAL (MISCELLANEOUS) IMPLANT
CLOSURE STERI-STRIP 1/2X4 (GAUZE/BANDAGES/DRESSINGS) ×1
CLSR STERI-STRIP ANTIMIC 1/2X4 (GAUZE/BANDAGES/DRESSINGS) ×2 IMPLANT
COVER BACK TABLE 24X17X13 BIG (DRAPES) IMPLANT
COVER SURGICAL LIGHT HANDLE (MISCELLANEOUS) ×3 IMPLANT
DRAPE INCISE IOBAN 66X45 STRL (DRAPES) IMPLANT
DRAPE ORTHO SPLIT 77X108 STRL (DRAPES) ×4
DRAPE SURG ORHT 6 SPLT 77X108 (DRAPES) ×2 IMPLANT
DRAPE U-SHAPE 47X51 STRL (DRAPES) ×3 IMPLANT
DRILL BIT 5/64 (BIT) ×3 IMPLANT
DRSG MEPILEX BORDER 4X12 (GAUZE/BANDAGES/DRESSINGS) IMPLANT
DRSG MEPILEX BORDER 4X8 (GAUZE/BANDAGES/DRESSINGS) IMPLANT
DURAPREP 26ML APPLICATOR (WOUND CARE) ×3 IMPLANT
ELECT BLADE 6.5 EXT (BLADE) IMPLANT
ELECT CAUTERY BLADE 6.4 (BLADE) ×3 IMPLANT
ELECT REM PT RETURN 9FT ADLT (ELECTROSURGICAL) ×3
ELECTRODE REM PT RTRN 9FT ADLT (ELECTROSURGICAL) ×1 IMPLANT
FACESHIELD WRAPAROUND (MASK) ×6 IMPLANT
GLOVE BIOGEL PI ORTHO PRO SZ8 (GLOVE) ×2
GLOVE ORTHO TXT STRL SZ7.5 (GLOVE) ×3 IMPLANT
GLOVE PI ORTHO PRO STRL SZ8 (GLOVE) ×1 IMPLANT
GLOVE SURG ORTHO 8.0 STRL STRW (GLOVE) ×6 IMPLANT
GOWN STRL REUS W/ TWL XL LVL3 (GOWN DISPOSABLE) ×1 IMPLANT
GOWN STRL REUS W/TWL 2XL LVL3 (GOWN DISPOSABLE) ×3 IMPLANT
GOWN STRL REUS W/TWL XL LVL3 (GOWN DISPOSABLE) ×2
HANDPIECE INTERPULSE COAX TIP (DISPOSABLE)
KIT BASIN OR (CUSTOM PROCEDURE TRAY) ×3 IMPLANT
KIT ROOM TURNOVER OR (KITS) ×3 IMPLANT
MANIFOLD NEPTUNE II (INSTRUMENTS) ×3 IMPLANT
NEEDLE 22X1 1/2 (OR ONLY) (NEEDLE) ×3 IMPLANT
NS IRRIG 1000ML POUR BTL (IV SOLUTION) ×3 IMPLANT
PACK TOTAL JOINT (CUSTOM PROCEDURE TRAY) ×3 IMPLANT
PAD ARMBOARD 7.5X6 YLW CONV (MISCELLANEOUS) ×6 IMPLANT
PASSER SUT SWANSON 36MM LOOP (INSTRUMENTS) ×3 IMPLANT
PILLOW ABDUCTION HIP (SOFTGOODS) ×3 IMPLANT
PRESSURIZER FEMORAL UNIV (MISCELLANEOUS) IMPLANT
RETRIEVER SUT HEWSON (MISCELLANEOUS) ×3 IMPLANT
SET HNDPC FAN SPRY TIP SCT (DISPOSABLE) IMPLANT
SUT FIBERWIRE #2 38 REV NDL BL (SUTURE) ×6
SUT MNCRL AB 4-0 PS2 18 (SUTURE) ×3 IMPLANT
SUT VIC AB 0 CT1 27 (SUTURE) ×2
SUT VIC AB 0 CT1 27XBRD ANBCTR (SUTURE) ×1 IMPLANT
SUT VIC AB 1 CT1 27 (SUTURE) ×4
SUT VIC AB 1 CT1 27XBRD ANBCTR (SUTURE) ×2 IMPLANT
SUT VIC AB 3-0 SH 8-18 (SUTURE) ×3 IMPLANT
SUTURE FIBERWR#2 38 REV NDL BL (SUTURE) ×2 IMPLANT
SYR CONTROL 10ML LL (SYRINGE) ×3 IMPLANT
TOWEL OR 17X24 6PK STRL BLUE (TOWEL DISPOSABLE) ×3 IMPLANT
TOWEL OR 17X26 10 PK STRL BLUE (TOWEL DISPOSABLE) ×3 IMPLANT
TOWER CARTRIDGE SMART MIX (DISPOSABLE) IMPLANT
TRAY FOLEY CATH 16FRSI W/METER (SET/KITS/TRAYS/PACK) ×3 IMPLANT
WATER STERILE IRR 1000ML POUR (IV SOLUTION) ×12 IMPLANT

## 2013-09-03 NOTE — Consult Note (Signed)
INTERVENTIONAL CARDIOLOGY CONSULT NOTE  Patient ID: Ashley Lutz, MRN: [id number], DOB/AGE: [id number] 78 y.o. Admit date: 09/02/2013 Date of Consult: 09/03/2013  Primary Physician: Ginette Otto, MD Primary Cardiologist: new (Dr Jacinto Halim).  Referring Physician: Dr Jacinto Halim  Chief Complaint: Shortness of breath Reason for Consultation: Severe aortic stenosis  HPI: 80 y.o. woman hospitalized this morning after a mechanical fall. She has sustained a right hip fracture and require surgery. She was noted to have a loud heart murmur and an echocardiogram has demonstrated severe aortic stenosis with an aortic valve area of 0.7 and a mean transaortic valve gradient of 70 mm mercury. The patient was noted to be short of breath on admission and required IV diuretics.  She has not previously had resting symptoms or signs of congestive heart failure. However, she is somewhat limited by dyspnea with exertion. She is able to walk slowly and do some things around the house. She lives at home with her son. She has to stop and rest frequently because of fatigue and dyspnea. She denies any chest pain, chest pressure, lightheadedness, or syncope. She's had no heart palpitations. She denies leg swelling. She was seen for preoperative cardiac assessment by Dr. Jacinto Halim who has requested that I see the patient for consideration of preoperative balloon aortic valvuloplasty in order to reduce her perioperative risk in the setting of severe/critical aortic stenosis.  The patient has been followed by hematology for chronic thrombocytopenia. She has been treated with prednisone but despite that has had slow continual decline of her platelet count. She is thought to have either ITP or myelodysplastic syndrome. Otherwise she has been relatively healthy considering her advanced age.  Past Medical History  Diagnosis Date  . Thrombocytopenia 2010  . Hypertension   . Heart murmur   . Wrist fracture, left   . Chronic  idiopathic monocytosis   . Enlargement of lymph nodes     H/O small bilateral axillary lymph nodes  . Shortness of breath     with excertion      Surgical History:  Past Surgical History  Procedure Laterality Date  . Hemorrhoid surgery    . Bunionectomy    . Soft tissue cyst excision      X 5 occasions--cysts on scalp     Home Meds: Prior to Admission medications   Medication Sig Start Date End Date Taking? Authorizing Provider  amLODipine (NORVASC) 5 MG tablet Take 5 mg by mouth daily.   Yes Historical Provider, MD  calcium-vitamin D (OSCAL WITH D) 250-125 MG-UNIT per tablet Take 1 tablet by mouth daily.   Yes Historical Provider, MD  Multiple Vitamins-Minerals (CENTRUM SILVER PO) Take 1 tablet by mouth daily.   Yes Historical Provider, MD  ramipril (ALTACE) 10 MG capsule Take 10 mg by mouth daily.   Yes Historical Provider, MD    Inpatient Medications:  . methylPREDNISolone (SOLU-MEDROL) injection  30 mg Intravenous Q24H  . senna  1 tablet Oral BID      Allergies:  Allergies  Allergen Reactions  . Milk-Related Compounds Other (See Comments)    Lactose intolerant    History   Social History  . Marital Status: Married    Spouse Name: N/A    Number of Children: N/A  . Years of Education: N/A   Occupational History  . Not on file.   Social History Main Topics  . Smoking status: Never Smoker   . Smokeless tobacco: Not on file  . Alcohol Use: No  . Drug  Use: No  . Sexual Activity: Not on file   Other Topics Concern  . Not on file   Social History Narrative  . No narrative on file    Family history: Negative for premature CAD.  Review of Systems: General: negative for chills, fever, night sweats or weight changes. Positive for generalized fatigue ENT: negative for rhinorrhea or epistaxis Cardiovascular: see HPI. Negative for chest pain or syncope. Positive for exertional dyspnea. Negative for palpitations or edema. Dermatological: negative for  rash Respiratory: negative for cough or wheezing GI: negative for nausea, vomiting, diarrhea, bright red blood per rectum, melena, or hematemesis GU: no hematuria, urgency, or frequency Neurologic: negative for visual changes, syncope, headache, or dizziness Heme: no easy bruising or bleeding Endo: negative for excessive thirst, thyroid disorder, or flushing Musculoskeletal: positive for right hip pain All other systems reviewed and are otherwise negative except as noted above.  Physical Exam: Blood pressure 122/64, pulse 66, temperature 98.5 F (36.9 C), temperature source Oral, resp. rate 18, height 5\' 6"  (1.676 m), weight 111 lb 12.4 oz (50.7 kg), SpO2 99.00%. General: Well developed, elderly, alert and oriented, pleasant woman in no acute distress. HEENT: Normocephalic, atraumatic, sclera non-icteric, no xanthomas, nares are without discharge.  Neck: Supple. Carotids delayed with bilateral bruits. JVP normal Lungs: Clear bilaterally to auscultation without wheezes, rales, or rhonchi. Breathing is unlabored. Heart: RRR with grade 4/6 late peaking crescendo decrescendo systolic murmur best heard at the LV apex Abdomen: Soft, non-tender, non-distended with normoactive bowel sounds. No hepatomegaly. No rebound/guarding. No obvious abdominal masses. Back: No CVA tenderness Msk:  Strength and tone appear normal for age. Extremities: No clubbing, cyanosis, or edema.  Distal pedal pulses are 2+ and equal bilaterally. Neuro: CNII-XII intact, moves all extremities spontaneously. Psych:  Responds to questions appropriately with a normal affect.    Labs: No results found for this basename: CKTOTAL, CKMB, TROPONINI,  in the last 72 hours Lab Results  Component Value Date   WBC 12.4* 09/03/2013   HGB 10.4* 09/03/2013   HCT 32.0* 09/03/2013   MCV 92.2 09/03/2013   PLT 59* 09/03/2013    Recent Labs Lab 09/03/13 1213  NA 139  K 3.4*  CL 97  CO2 27  BUN 14  CREATININE 0.60  CALCIUM 8.9  PROT  8.0  BILITOT 0.5  ALKPHOS 63  ALT 12  AST 27  GLUCOSE 141*   No results found for this basename: CHOL, HDL, LDLCALC, TRIG   No results found for this basename: DDIMER    Radiology/Studies:  Dg Chest 1 View  09/03/2013   CLINICAL DATA:  Fall.  EXAM: CHEST - 1 VIEW  COMPARISON:  12/01/2012  FINDINGS: Lungs are adequately inflated with mild prominence of the perihilar markings likely mild vascular congestion. Borderline cardiomegaly. There is calcified plaque over the thoracic aorta. There are degenerate changes spinal bowel curvature of the thoracic spine convex to the right.  IMPRESSION: Borderline cardiomegaly and suggestion of mild vascular congestion.   Electronically Signed   By: Marin Olp M.D.   On: 09/03/2013 01:00   Dg Hip Complete Right  09/03/2013   CLINICAL DATA:  Fall.  EXAM: RIGHT HIP - COMPLETE 2+ VIEW  COMPARISON:  12/01/2012  FINDINGS: There is diffuse decreased bone mineralization. There are symmetric mild-to-moderate degenerative changes of the hips. There is a mildly displaced subcapital fracture of the right femoral neck. There are degenerative changes of the spine. Calcified plaque is present over the iliac and femoral arteries.  IMPRESSION:  Displaced subcapital fracture of the right femoral neck.   Electronically Signed   By: Marin Olp M.D.   On: 09/03/2013 00:58   Ct Head Wo Contrast  09/03/2013   ADDENDUM REPORT: 09/03/2013 03:14  ADDENDUM: Critical Value/emergent results were called by telephone at the time of interpretation on 09/03/2013 at 3:13 AM to [facility] , who verbally acknowledged these results.   Electronically Signed   By: Elon Alas   On: 09/03/2013 03:14   09/03/2013   CLINICAL DATA:  Fall.  EXAM: CT HEAD WITHOUT CONTRAST  TECHNIQUE: Contiguous axial images were obtained from the base of the skull through the vertex without intravenous contrast.  COMPARISON:  None available for comparison at time of study interpretation.  IMPRESSION: The ventricles  and sulci are normal for age. No intraparenchymal hemorrhage, mass effect nor midline shift. Patchy to confluent supratentorial white matter hypodensities are within normal range for patient's age and though non-specific suggest sequelae of chronic small vessel ischemic disease. No acute large vascular territory infarcts.  Mild hyperdensity along the left cerebellar tentorium consistent with small subdural hematoma. 2 mm left parafalcine subdural hematoma with apparent trace left mesial frontal subarachnoid blood, axial 21/32. 4 mm right temporal subdural hematoma, axial 15/32. Basal cisterns are patent. Moderate calcific atherosclerosis of the carotid siphons.  Apparent small right temporal scalp hematoma versus redundant skin. No skull fracture. Visualized paranasal sinuses and mastoid aircells are well-aerated. The included ocular globes and orbital contents are non-suspicious. Status post bilateral ocular lens implants. Severe temporal mandibular osteoarthrosis.  Small left cerebellar tentorium, left parafalcine and right temporal subdural hematomas. Possible trace left mesial frontal subarachnoid hemorrhage. No skull fracture.  No intraparenchymal hemorrhage. Involutional changes. Moderate white matter changes suggest chronic small vessel ischemic disease.  Electronically Signed: By: Elon Alas On: 09/03/2013 03:09   2D Echo: Left ventricle: The cavity size was normal. Wall thickness was increased in a pattern of moderate LVH. Systolic function was normal. The estimated ejection fraction was in the range of 60% to 65%.  ------------------------------------------------------------ Aortic valve: Severely calcified leaflets. Doppler: There was severe stenosis. Valve area: 0.74cm^2(VTI). Indexed valve area: 0.47cm^2/m^2 (VTI). Peak velocity ratio of LVOT to aortic valve: 0.21. Valve area: 0.67cm^2 (Vmax). Indexed valve area: 0.43cm^2/m^2 (Vmax). Mean gradient: 31mm Hg (S). Peak gradient: 158mm  Hg (S).  ------------------------------------------------------------ Mitral valve: Small diastolic gradient without significant MS Severely calcified annulus. Doppler: Trivial regurgitation. Valve area by pressure half-time: 2.62cm^2. Indexed valve area by pressure half-time: 1.68cm^2/m^2. Valve area by continuity equation (using LVOT flow): 1.88cm^2. Indexed valve area by continuity equation (using LVOT flow): 1.21cm^2/m^2. Mean gradient: 30mm Hg (D). Peak gradient: 46mm Hg (D).  ------------------------------------------------------------ Left atrium: The atrium was moderately dilated.  ------------------------------------------------------------ Atrial septum: No defect or patent foramen ovale was identified.  ------------------------------------------------------------ Right ventricle: The cavity size was normal. Wall thickness was normal. Systolic function was normal.  ------------------------------------------------------------ Pulmonic valve: Structurally normal valve. Cusp separation was normal. Doppler: Transvalvular velocity was within the normal range. Trivial regurgitation.  ------------------------------------------------------------ Tricuspid valve: Structurally normal valve. Leaflet separation was normal. Doppler: Transvalvular velocity was within the normal range. Mild regurgitation.  ------------------------------------------------------------ Right atrium: The atrium was mildly dilated.  ------------------------------------------------------------ Pericardium: The pericardium was normal in appearance.  ------------------------------------------------------------  2D measurements Normal Doppler measurements Normal Left ventricle Main pulmonary LVID ED, 36.3 mm 43-52 artery chord, Pressure, 42 mm Hg =30 PLAX S LVID ES, 22.7 mm 23-38 Pressure 9 mm Hg ------ chord, ED PLAX LVOT FS, chord, 37 % >  29 Peak vel, 114 cm/s ------ PLAX S LVPW, ED 10.9 mm  ------ Peak 5 mm Hg ------ IVS/LVPW 1.36 <1.3 gradient, ratio, ED S Ventricular septum Aortic valve IVS, ED 14.8 mm ------ Peak vel, 532 cm/s ------ LVOT S Diam, S 20 mm ------ Mean vel, 382 cm/s ------ Area 3.14 cm^2 ------ S Aorta VTI, S 121 cm ------ Root diam, 29 mm ------ Mean 69 mm Hg ------ ED gradient, Left atrium S AP dim 41 mm ------ Peak 113 mm Hg ------ AP dim 2.63 cm/m^2 <2.2 gradient, index S Vol, S 84.2 ml ------ Area, VTI 0.74 cm^2 ------ Vol index, 54 ml/m^2 ------ Area index 0.47 cm^2/m ------ S (VTI) ^2 Peak vel 0.21 ------ ratio, LVOT/AV Area, Vmax 0.67 cm^2 ------ Area index 0.43 cm^2/m ------ (Vmax) ^2 Mitral valve Peak E vel 181 cm/s ------ Peak A vel 184 cm/s ------ Mean vel, 121 cm/s ------ D Decelerati 349 ms 150-23 on time 0 Pressure 84 ms ------ half-time Mean 7 mm Hg ------ gradient, D Peak 14 mm Hg ------ gradient, D Peak E/A 1 ------ ratio Area (PHT) 2.62 cm^2 ------ Area index 1.68 cm^2/m ------ (PHT) ^2 Area 1.88 cm^2 ------ (LVOT) continuity Area index 1.21 cm^2/m ------ (LVOT ^2 cont) Annulus 49.6 cm ------ VTI Tricuspid valve Regurg 313 cm/s ------ peak vel Peak RV-RA 39 mm Hg ------ gradient, S Max regurg 313 cm/s ------ vel Systemic veins Estimated 3 mm Hg ------ CVP Right ventricle Pressure, 42 mm Hg <30 S Sa vel, 18 cm/s ------ lat ann, tiss DP Pulmonic valve Regurg 119 cm/s ------ vel, ED  ASSESSMENT AND PLAN:  80 year old woman with right femoral neck fracture secondary to a mechanical fall. The patient has multiple significant comorbid cardiac and medical conditions including:   Severe aortic stenosis and New York Heart Association functional class 2-3 congestive heart failure(exertional dyspnea)  ITP initially with platelet count 20,000, now improved to greater than 50,000 with IV steroids and platelet transfusion  Small subdural hematoma without neurologic deficit  This is a difficult  clinical situation in this elderly but functionally independent woman who lives at home with her son. She certainly is at increased cardiac risk with hip surgery because of her very severe aortic stenosis. She did require IV diuresis on admission, but has not otherwise exhibited signs or symptoms of resting congestive heart failure. She has slowly progressive dyspnea with exertion which is likely related to her aortic stenosis. Treatment options include palliative care, hip surgery, or balloon aortic valvuloplasty followed by hip surgery. In my opinion, weighing the overall risk/benefit of palliative balloon aortic valvuloplasty, I would favor proceeding directly with hip surgery if anesthesia is comfortable with this approach. I think the risk of vascular complication related to arterial access in this elderly thrombocytopenic patient in addition to the risk associated with heparin in this patient with a subdural hematoma outweighs the potential benefit of this palliative procedure. However, if anesthesia feels her risk of surgery is prohibitive, I would be willing to perform aortic valvuloplasty as a bridge to surgery. I agree with Dr Einar Gip that close monitoring of her hemodynamics and avoidance of perioperative hypotension will be essential in her management. I have discussed her case with Dr Orene Desanctis of anesthesia, Dr Maryland Pink, and Dr Einar Gip. I also discussed my recommendation at length with the patient and her son who was present for her evaluation.  After recovery from hip surgery, I would be happy to arrange follow-up in the multidisciplinary heart valve clinic to further  assess treatment options for this patient's severe AS.  Thank you for allowing me to participate in the care of this nice patient and please let me know if any questions arise.   Deatra James  09/03/2013, 4:04 PM

## 2013-09-03 NOTE — Consult Note (Signed)
Reason for Consult:chi Referring Physician: EDP  Ashley Lutz is an 80 y.o. female.   HPI:  80 year old white female who fell yesterday and broke her hip. She did not strike her head. She did not lose consciousness. She came in with thrombocytopenia with platelet count of 21K. CT scan of the head showed some mild subdural blood layering over the tentorium and into inter-falcine space and neurosurgical evaluation was requested. The patient denies headache. She denies nausea vomiting. She denies numbness tingling or weakness. She is scheduled for her surgical intervention to her hip fracture I believe. No visual changes.  No seizures.  Past Medical History  Diagnosis Date  . Thrombocytopenia 2010  . Hypertension   . Heart murmur   . Wrist fracture, left   . Chronic idiopathic monocytosis   . Enlargement of lymph nodes     H/O small bilateral axillary lymph nodes  . Shortness of breath     with excertion    Past Surgical History  Procedure Laterality Date  . Hemorrhoid surgery    . Bunionectomy    . Soft tissue cyst excision      X 5 occasions--cysts on scalp    Allergies  Allergen Reactions  . Milk-Related Compounds Other (See Comments)    Lactose intolerant    History  Substance Use Topics  . Smoking status: Never Smoker   . Smokeless tobacco: Not on file  . Alcohol Use: No    History reviewed. No pertinent family history.   Review of Systems  Positive ROS: neg  All other systems have been reviewed and were otherwise negative with the exception of those mentioned in the HPI and as above.  Objective: Vital signs in last 24 hours: Temp:  [97.4 F (36.3 C)-98.4 F (36.9 C)] 98.4 F (36.9 C) (04/09 1235) Pulse Rate:  [72-92] 77 (04/09 1235) Resp:  [13-24] 13 (04/09 1235) BP: (132-164)/(67-103) 148/69 mmHg (04/09 1235) SpO2:  [85 %-99 %] 98 % (04/09 1235) Weight:  [50.7 kg (111 lb 12.4 oz)] 50.7 kg (111 lb 12.4 oz) (04/09 0600)  General Appearance: Alert,  cooperative, no distress, appears stated age Head: Normocephalic, without obvious abnormality, atraumatic Eyes: PERRL, conjunctiva/corneas clear, EOM's intact, fundi benign, both eyes      Ears: Normal TM's and external ear canals, both ears Throat: benign Neck: Supple, symmetrical, trachea midline, no adenopathy; thyroid: No enlargement/tenderness/nodules; no carotid bruit or JVD Back: Symmetric, no curvature, ROM normal, no CVA tenderness Lungs: Clear to auscultation bilaterally, respirations unlabored Heart: Regular rate and rhythm, S1 and S2 normal, no murmur, rub or gallop Abdomen: Soft, non-tender, bowel sounds active all four quadrants, no masses, no organomegaly Extremities: Extremities normal, atraumatic, no cyanosis or edema Pulses: 2+ and symmetric all extremities Skin: Skin color, texture, turgor normal, no rashes or lesions  NEUROLOGIC:   Mental status: A&O x4, no aphasia, good attention span, Memory and fund of knowledge Motor Exam - grossly normal, normal tone and bulk Sensory Exam - grossly normal Reflexes: symmetric, no pathologic reflexes, No Hoffman's, No clonus Coordination - grossly normal Gait - not tested Balance - not tested Cranial Nerves: I: smell Not tested  II: visual acuity  OS: na    OD: na  II: visual fields Full to confrontation  II: pupils Equal, round, reactive to light  III,VII: ptosis None  III,IV,VI: extraocular muscles  Full ROM  V: mastication Normal  V: facial light touch sensation  Normal  V,VII: corneal reflex  Present  VII: facial muscle  function - upper  Normal  VII: facial muscle function - lower Normal  VIII: hearing Not tested  IX: soft palate elevation  Normal  IX,X: gag reflex Present  XI: trapezius strength  5/5  XI: sternocleidomastoid strength 5/5  XI: neck flexion strength  5/5  XII: tongue strength  Normal    Data Review Lab Results  Component Value Date   WBC 12.4* 09/03/2013   HGB 10.4* 09/03/2013   HCT 32.0*  09/03/2013   MCV 92.2 09/03/2013   PLT 59* 09/03/2013   Lab Results  Component Value Date   NA 139 09/03/2013   K 3.4* 09/03/2013   CL 97 09/03/2013   CO2 27 09/03/2013   BUN 14 09/03/2013   CREATININE 0.60 09/03/2013   GLUCOSE 141* 09/03/2013   Lab Results  Component Value Date   INR 1.14 09/02/2013    Radiology: Dg Chest 1 View  09/03/2013   CLINICAL DATA:  Fall.  EXAM: CHEST - 1 VIEW  COMPARISON:  12/01/2012  FINDINGS: Lungs are adequately inflated with mild prominence of the perihilar markings likely mild vascular congestion. Borderline cardiomegaly. There is calcified plaque over the thoracic aorta. There are degenerate changes spinal bowel curvature of the thoracic spine convex to the right.  IMPRESSION: Borderline cardiomegaly and suggestion of mild vascular congestion.   Electronically Signed   By: Marin Olp M.D.   On: 09/03/2013 01:00   Dg Hip Complete Right  09/03/2013   CLINICAL DATA:  Fall.  EXAM: RIGHT HIP - COMPLETE 2+ VIEW  COMPARISON:  12/01/2012  FINDINGS: There is diffuse decreased bone mineralization. There are symmetric mild-to-moderate degenerative changes of the hips. There is a mildly displaced subcapital fracture of the right femoral neck. There are degenerative changes of the spine. Calcified plaque is present over the iliac and femoral arteries.  IMPRESSION: Displaced subcapital fracture of the right femoral neck.   Electronically Signed   By: Marin Olp M.D.   On: 09/03/2013 00:58   Ct Head Wo Contrast  09/03/2013   ADDENDUM REPORT: 09/03/2013 03:14  ADDENDUM: Critical Value/emergent results were called by telephone at the time of interpretation on 09/03/2013 at 3:13 AM to [facility] , who verbally acknowledged these results.   Electronically Signed   By: Elon Alas   On: 09/03/2013 03:14   09/03/2013   CLINICAL DATA:  Fall.  EXAM: CT HEAD WITHOUT CONTRAST  TECHNIQUE: Contiguous axial images were obtained from the base of the skull through the vertex without intravenous  contrast.  COMPARISON:  None available for comparison at time of study interpretation.  IMPRESSION: The ventricles and sulci are normal for age. No intraparenchymal hemorrhage, mass effect nor midline shift. Patchy to confluent supratentorial white matter hypodensities are within normal range for patient's age and though non-specific suggest sequelae of chronic small vessel ischemic disease. No acute large vascular territory infarcts.  Mild hyperdensity along the left cerebellar tentorium consistent with small subdural hematoma. 2 mm left parafalcine subdural hematoma with apparent trace left mesial frontal subarachnoid blood, axial 21/32. 4 mm right temporal subdural hematoma, axial 15/32. Basal cisterns are patent. Moderate calcific atherosclerosis of the carotid siphons.  Apparent small right temporal scalp hematoma versus redundant skin. No skull fracture. Visualized paranasal sinuses and mastoid aircells are well-aerated. The included ocular globes and orbital contents are non-suspicious. Status post bilateral ocular lens implants. Severe temporal mandibular osteoarthrosis.  Small left cerebellar tentorium, left parafalcine and right temporal subdural hematomas. Possible trace left mesial frontal subarachnoid hemorrhage. No  skull fracture.  No intraparenchymal hemorrhage. Involutional changes. Moderate white matter changes suggest chronic small vessel ischemic disease.  Electronically Signed: By: Elon Alas On: 09/03/2013 03:09     Assessment/Plan: 80 year old female with some subdural blood along the interhemispheric fissure and left tentorium without mass effect or shift. This requires no further workup or intervention in that she has a change in neurologic status. She is safe for her surgery once her thrombocytopenia has resolved or has been treated. Please call if I can be of further assistance.   Eustace Moore 09/03/2013 3:23 PM

## 2013-09-03 NOTE — ED Notes (Signed)
Critical lab reported to RN; PA and MD notified of platelet count of 21

## 2013-09-03 NOTE — ED Notes (Signed)
Hospitalist MD at bedside. 

## 2013-09-03 NOTE — ED Notes (Signed)
Pt continues to desat to 84-85%; PA notified ok if sats at 88-89%; Pt placed on nonrebreather mask due to sats at 85%; sats up to 96-97% continuous with mask on.

## 2013-09-03 NOTE — Progress Notes (Signed)
  Echocardiogram 2D Echocardiogram has been performed.  Basilia Jumbo 09/03/2013, 10:19 AM

## 2013-09-03 NOTE — ED Notes (Signed)
PA at bedside.

## 2013-09-03 NOTE — ED Notes (Signed)
Patient transported to CT 

## 2013-09-03 NOTE — Progress Notes (Signed)
[facility] PROGRESS NOTE  SERI KIMMER QVZ:[id number] DOB: July 18, 1918 DOA: 09/02/2013 PCP: Mathews Argyle, MD  Assessment/Plan: Principal Problem:   SDH (subdural hematoma): Agree with critical care, likely not from thrombocytopenia directly. Likely sustained from fall. Followup CT scan pending Active Problems:   Hip fracture: Awaiting followup in cardiology, although there is concern that her severe aortic stenosis that she will not be a candidate for surgery. If that is the case, we'll need to consider palliative options.    Myelodysplastic syndrome, unspecified   Chronic ITP (idiopathic thrombocytopenia): Status post platelet transfusion. Agreed to this will do little other than increased platelet consumption. To be seen by hematology   Acute respiratory failure with hypoxia    Aortic stenosis, severe: Noted on echocardiogram. Have asked cardiology to see, however they're concerned is that she may not be a good surgical candidate. We will review formally in person  Code Status: Full code  Family Communication: Son at the bedside.  Disposition Plan: Patient may not be a candidate for surgery. If so, we'll need to consider palliative options.   Consultants:  Critical care-[facility]  Cardiology-[facility]  Hematology  Orthopedic surgery-[facility]  Procedures:  Echocardiogram done 4/9: Severe aortic stenosis  Antibiotics:  None  HPI/Subjective: Patient doing okay. Complains of some right sided hip pain. Does not complain of a headache.  Objective: Filed Vitals:   09/03/13 1235  BP: 148/69  Pulse: 77  Temp: 98.4 F (36.9 C)  Resp: 13    Intake/Output Summary (Last 24 hours) at 09/03/13 1317 Last data filed at 09/03/13 1126  Gross per 24 hour  Intake     50 ml  Output   2150 ml  Net  -2100 ml   Filed Weights   09/03/13 0600  Weight: 50.7 kg (111 lb 12.4 oz)    Exam:   General:  Alert and oriented x2, no acute distress  Cardiovascular: Harsh  3/6 holosystolic murmur, regular rate and rhythm  Respiratory: Clear to auscultation bilaterally  Abdomen: Soft, nontender, nondistended, positive bowel sounds  Musculoskeletal: No clubbing or cyanosis or edema   Data Reviewed: Basic Metabolic Panel:  Recent Labs Lab 09/03/13 0027  NA 141  K 3.8  CL 104  GLUCOSE 116*  BUN 18  CREATININE 0.80   Liver Function Tests: No results found for this basename: AST, ALT, ALKPHOS, BILITOT, PROT, ALBUMIN,  in the last 168 hours No results found for this basename: LIPASE, AMYLASE,  in the last 168 hours No results found for this basename: AMMONIA,  in the last 168 hours CBC:  Recent Labs Lab 09/02/13 2355 09/03/13 0027  WBC 8.3  --   HGB 10.6* 11.6*  HCT 32.6* 34.0*  MCV 92.9  --   PLT 21*  --    Cardiac Enzymes: No results found for this basename: CKTOTAL, CKMB, CKMBINDEX, TROPONINI,  in the last 168 hours BNP (last 3 results) No results found for this basename: PROBNP,  in the last 8760 hours CBG: No results found for this basename: GLUCAP,  in the last 168 hours  Recent Results (from the past 240 hour(s))  MRSA PCR SCREENING     Status: None   Collection Time    09/03/13  6:19 AM      Result Value Ref Range Status   MRSA by PCR NEGATIVE  NEGATIVE Final   Comment:            The GeneXpert MRSA Assay (FDA     approved for NASAL specimens  only), is one component of a     comprehensive MRSA colonization     surveillance program. It is not     intended to diagnose MRSA     infection nor to guide or     monitor treatment for     MRSA infections.     Studies: Dg Chest 1 View  09/03/2013   CLINICAL DATA:  Fall.  EXAM: CHEST - 1 VIEW  COMPARISON:  12/01/2012  FINDINGS: Lungs are adequately inflated with mild prominence of the perihilar markings likely mild vascular congestion. Borderline cardiomegaly. There is calcified plaque over the thoracic aorta. There are degenerate changes spinal bowel curvature of the thoracic  spine convex to the right.  IMPRESSION: Borderline cardiomegaly and suggestion of mild vascular congestion.   Electronically Signed   By: Marin Olp M.D.   On: 09/03/2013 01:00   Dg Hip Complete Right  09/03/2013   CLINICAL DATA:  Fall.  EXAM: RIGHT HIP - COMPLETE 2+ VIEW  COMPARISON:  12/01/2012  FINDINGS: There is diffuse decreased bone mineralization. There are symmetric mild-to-moderate degenerative changes of the hips. There is a mildly displaced subcapital fracture of the right femoral neck. There are degenerative changes of the spine. Calcified plaque is present over the iliac and femoral arteries.  IMPRESSION: Displaced subcapital fracture of the right femoral neck.   Electronically Signed   By: Marin Olp M.D.   On: 09/03/2013 00:58   Ct Head Wo Contrast  09/03/2013   ADDENDUM REPORT: 09/03/2013 03:14  ADDENDUM: Critical Value/emergent results were called by telephone at the time of interpretation on 09/03/2013 at 3:13 AM to [facility] , who verbally acknowledged these results.   Electronically Signed   By: Elon Alas   On: 09/03/2013 03:14   09/03/2013   CLINICAL DATA:  Fall.  EXAM: CT HEAD WITHOUT CONTRAST  TECHNIQUE: Contiguous axial images were obtained from the base of the skull through the vertex without intravenous contrast.  COMPARISON:  None available for comparison at time of study interpretation.  IMPRESSION: The ventricles and sulci are normal for age. No intraparenchymal hemorrhage, mass effect nor midline shift. Patchy to confluent supratentorial white matter hypodensities are within normal range for patient's age and though non-specific suggest sequelae of chronic small vessel ischemic disease. No acute large vascular territory infarcts.  Mild hyperdensity along the left cerebellar tentorium consistent with small subdural hematoma. 2 mm left parafalcine subdural hematoma with apparent trace left mesial frontal subarachnoid blood, axial 21/32. 4 mm right temporal subdural  hematoma, axial 15/32. Basal cisterns are patent. Moderate calcific atherosclerosis of the carotid siphons.  Apparent small right temporal scalp hematoma versus redundant skin. No skull fracture. Visualized paranasal sinuses and mastoid aircells are well-aerated. The included ocular globes and orbital contents are non-suspicious. Status post bilateral ocular lens implants. Severe temporal mandibular osteoarthrosis.  Small left cerebellar tentorium, left parafalcine and right temporal subdural hematomas. Possible trace left mesial frontal subarachnoid hemorrhage. No skull fracture.  No intraparenchymal hemorrhage. Involutional changes. Moderate white matter changes suggest chronic small vessel ischemic disease.  Electronically Signed: By: Elon Alas On: 09/03/2013 03:09    Scheduled Meds: . methylPREDNISolone (SOLU-MEDROL) injection  30 mg Intravenous Q24H  . senna  1 tablet Oral BID   Continuous Infusions:   Principal Problem:   SDH (subdural hematoma) Active Problems:   Hip fracture   Myelodysplastic syndrome, unspecified   Chronic ITP (idiopathic thrombocytopenia)   Acute respiratory failure with hypoxia   Aortic heart murmur  Aortic stenosis, severe    Time spent: 35 minutes    Annita Brod  [facility] Pager [phone] If 7PM-7AM, please contact night-coverage at [email or web address], password [facility] 09/03/2013, 1:17 PM  LOS: 1 day

## 2013-09-03 NOTE — Consult Note (Signed)
Reason for Consult:fall/TBI/hip fracture Referring Physician: Samah Lapiana is an 80 y.o. female.  HPI:  Pt is a 80 yo F who presents after a fall at home.  She does not recall exactly what made her fall, but she does recall falling.  She did not recall exactly how she hit, but she does have right hip pain.  She denies headache or dizziness.  She denies loss of consciousness.  She uses her walker faithfully.  She denies chest pain, shortness of breath, dizziness, significant headache.  She is nto having any shortness of breath.  She was brought to ED with right hip pain and was found to have a fracture.     Past Medical History  Diagnosis Date  . Thrombocytopenia 2010  . Hypertension   . Heart murmur   . Wrist fracture, left   . Chronic idiopathic monocytosis   . Enlargement of lymph nodes     H/O small bilateral axillary lymph nodes  . Shortness of breath     with excertion    Past Surgical History  Procedure Laterality Date  . Hemorrhoid surgery    . Bunionectomy    . Soft tissue cyst excision      X 5 occasions--cysts on scalp    History reviewed. No pertinent family history.  Social History:  reports that she has never smoked. She does not have any smokeless tobacco history on file. She reports that she does not drink alcohol or use illicit drugs.  Allergies:  Allergies  Allergen Reactions  . Milk-Related Compounds Other (See Comments)    Lactose intolerant    Medications: MedicationsLong-Term  Outpatient Medications Hospital Medications    amLODipine (NORVASC) 5 MG tablet   calcium-vitamin D (OSCAL WITH D) 250-125 MG-UNIT per tablet   Multiple Vitamins-Minerals (CENTRUM SILVER PO)   ramipril (ALTACE) 10 MG capsule     Results for orders placed during the hospital encounter of 09/02/13 (from the past 48 hour(s))  CBC     Status: Abnormal   Collection Time    09/02/13 11:55 PM      Result Value Ref Range   WBC 8.3  4.0 - 10.5 K/uL   Comment: WHITE  COUNT CONFIRMED ON SMEAR   RBC 3.51 (*) 3.87 - 5.11 MIL/uL   Hemoglobin 10.6 (*) 12.0 - 15.0 g/dL   HCT 32.6 (*) 36.0 - 46.0 %   MCV 92.9  78.0 - 100.0 fL   MCH 30.2  26.0 - 34.0 pg   MCHC 32.5  30.0 - 36.0 g/dL   RDW 17.7 (*) 11.5 - 15.5 %   Platelets 21 (*) 150 - 400 K/uL   Comment: SPECIMEN CHECKED FOR CLOTS     REPEATED TO VERIFY     PLATELET COUNT CONFIRMED BY SMEAR     CRITICAL RESULT CALLED TO, READ BACK BY AND VERIFIED WITH:     Jerilynn Mages [facility] 09/03/13 0121 SHIPMAN M  PROTIME-INR     Status: None   Collection Time    09/02/13 11:55 PM      Result Value Ref Range   Prothrombin Time 14.4  11.6 - 15.2 seconds   INR 1.14  0.00 - 1.49  TYPE AND SCREEN     Status: None   Collection Time    09/02/13 11:55 PM      Result Value Ref Range   ABO/RH(D) O POS     Antibody Screen NEG     Sample Expiration 09/05/2013    ABO/RH  Status: None   Collection Time    09/02/13 11:55 PM      Result Value Ref Range   ABO/RH(D) O POS    I-STAT CHEM 8, ED     Status: Abnormal   Collection Time    09/03/13 12:27 AM      Result Value Ref Range   Sodium 141  137 - 147 mEq/L   Potassium 3.8  3.7 - 5.3 mEq/L   Chloride 104  96 - 112 mEq/L   BUN 18  6 - 23 mg/dL   Creatinine, Ser 0.80  0.50 - 1.10 mg/dL   Glucose, Bld 116 (*) 70 - 99 mg/dL   Calcium, Ion 1.16  1.13 - 1.30 mmol/L   TCO2 25  0 - 100 mmol/L   Hemoglobin 11.6 (*) 12.0 - 15.0 g/dL   HCT 34.0 (*) 36.0 - 46.0 %  PREPARE PLATELET PHERESIS     Status: None   Collection Time    09/03/13  4:49 AM      Result Value Ref Range   Unit Number [id number]     Blood Component Type PLTPHER LRI1     Unit division 00     Status of Unit ALLOCATED     Transfusion Status OK TO TRANSFUSE    BLOOD GAS, ARTERIAL     Status: Abnormal   Collection Time    09/03/13  5:26 AM      Result Value Ref Range   O2 Content 5.0     Delivery systems NASAL CANNULA     pH, Arterial 7.454 (*) 7.350 - 7.450   pCO2 arterial 35.4  35.0 - 45.0 mmHg    pO2, Arterial 64.3 (*) 80.0 - 100.0 mmHg   Bicarbonate 24.5 (*) 20.0 - 24.0 mEq/L   TCO2 25.6  0 - 100 mmol/L   Acid-Base Excess 1.0  0.0 - 2.0 mmol/L   O2 Saturation 92.1     Patient temperature 98.6     Collection site RIGHT RADIAL     Drawn by [id number]     Sample type ARTERIAL DRAW     Allens test (pass/fail) PASS  PASS    Dg Chest 1 View  09/03/2013   CLINICAL DATA:  Fall.  EXAM: CHEST - 1 VIEW  COMPARISON:  12/01/2012  FINDINGS: Lungs are adequately inflated with mild prominence of the perihilar markings likely mild vascular congestion. Borderline cardiomegaly. There is calcified plaque over the thoracic aorta. There are degenerate changes spinal bowel curvature of the thoracic spine convex to the right.  IMPRESSION: Borderline cardiomegaly and suggestion of mild vascular congestion.   Electronically Signed   By: Marin Olp M.D.   On: 09/03/2013 01:00   Dg Hip Complete Right  09/03/2013   CLINICAL DATA:  Fall.  EXAM: RIGHT HIP - COMPLETE 2+ VIEW  COMPARISON:  12/01/2012  FINDINGS: There is diffuse decreased bone mineralization. There are symmetric mild-to-moderate degenerative changes of the hips. There is a mildly displaced subcapital fracture of the right femoral neck. There are degenerative changes of the spine. Calcified plaque is present over the iliac and femoral arteries.  IMPRESSION: Displaced subcapital fracture of the right femoral neck.   Electronically Signed   By: Marin Olp M.D.   On: 09/03/2013 00:58   Ct Head Wo Contrast  09/03/2013   ADDENDUM REPORT: 09/03/2013 03:14  ADDENDUM: Critical Value/emergent results were called by telephone at the time of interpretation on 09/03/2013 at 3:13 AM to [facility] , who verbally acknowledged  these results.   Electronically Signed   By: Elon Alas   On: 09/03/2013 03:14   09/03/2013   CLINICAL DATA:  Fall.  EXAM: CT HEAD WITHOUT CONTRAST  TECHNIQUE: Contiguous axial images were obtained from the base of the skull through the vertex  without intravenous contrast.  COMPARISON:  None available for comparison at time of study interpretation.  IMPRESSION: The ventricles and sulci are normal for age. No intraparenchymal hemorrhage, mass effect nor midline shift. Patchy to confluent supratentorial white matter hypodensities are within normal range for patient's age and though non-specific suggest sequelae of chronic small vessel ischemic disease. No acute large vascular territory infarcts.  Mild hyperdensity along the left cerebellar tentorium consistent with small subdural hematoma. 2 mm left parafalcine subdural hematoma with apparent trace left mesial frontal subarachnoid blood, axial 21/32. 4 mm right temporal subdural hematoma, axial 15/32. Basal cisterns are patent. Moderate calcific atherosclerosis of the carotid siphons.  Apparent small right temporal scalp hematoma versus redundant skin. No skull fracture. Visualized paranasal sinuses and mastoid aircells are well-aerated. The included ocular globes and orbital contents are non-suspicious. Status post bilateral ocular lens implants. Severe temporal mandibular osteoarthrosis.  Small left cerebellar tentorium, left parafalcine and right temporal subdural hematomas. Possible trace left mesial frontal subarachnoid hemorrhage. No skull fracture.  No intraparenchymal hemorrhage. Involutional changes. Moderate white matter changes suggest chronic small vessel ischemic disease.  Electronically Signed: By: Elon Alas On: 09/03/2013 03:09    Review of Systems  Constitutional: Negative.   HENT: Negative.   Eyes: Negative.   Respiratory: Negative.   Cardiovascular: Negative.   Gastrointestinal: Negative.   Genitourinary: Negative.   Musculoskeletal: Positive for joint pain.       Right hip pain  Skin: Negative.   Neurological: Negative.   Endo/Heme/Allergies: Bruises/bleeds easily (h/o myelodysplastic syndrome with thrombocytopenia.).  Psychiatric/Behavioral: Negative.    Blood  pressure 164/84, pulse 72, temperature 97.4 F (36.3 C), temperature source Oral, resp. rate 22, SpO2 92.00%. Physical Exam  Constitutional: She is oriented to person, place, and time. She appears well-developed and well-nourished. No distress.  HENT:  Head: Normocephalic. Head is with abrasion.    Small abrasion on right nose where bridge of glasses hit.    Eyes: Conjunctivae are normal. Pupils are equal, round, and reactive to light. Right eye exhibits no discharge. Left eye exhibits no discharge. No scleral icterus.  Neck: Normal range of motion. Neck supple. No tracheal deviation present. No thyromegaly present.  Cardiovascular: Normal rate, regular rhythm, normal heart sounds and intact distal pulses.  Exam reveals no gallop and no friction rub.   No murmur heard. Respiratory: Effort normal and breath sounds normal. No respiratory distress. She has no wheezes. She has no rales. She exhibits no tenderness.  GI: Soft. Bowel sounds are normal. She exhibits no distension and no mass. There is no tenderness. There is no rebound and no guarding.  Musculoskeletal: She exhibits tenderness. She exhibits no edema.       Right shoulder: She exhibits tenderness.       Right hip: She exhibits bony tenderness and swelling.       Legs: Right hip pain  Lymphadenopathy:    She has no cervical adenopathy.  Neurological: She is alert and oriented to person, place, and time. Coordination normal.  Skin: Skin is warm and dry. No rash noted. She is not diaphoretic. No erythema. No pallor.  Psychiatric: She has a normal mood and affect. Her behavior is normal. Judgment and thought  content normal.    Assessment/Plan: 80 yo F with right hip fracture and TBI.  Neurosurgery consult for TBI Ortho consult for right hip fracture.  Medicine taking care of thrombocytopenia.    I do not see need for additional imaging unless nurosurgery want reeval of head.   Trauma to reexamine for secondary survey.     Stark Klein 09/03/2013, 5:46 AM

## 2013-09-03 NOTE — Progress Notes (Signed)
INITIAL NUTRITION ASSESSMENT  DOCUMENTATION CODES Per approved criteria  -Severe malnutrition in the context of chronic illness -Underweight   INTERVENTION: Ensure Complete po BID, each supplement provides 350 kcal and 13 grams of protein RD to follow for nutrition care plan  NUTRITION DIAGNOSIS: Increased nutrient needs related to post-op healing as evidenced by estimated nutrition needs  Goal: Pt to meet >/= 90% of their estimated nutrition needs   Monitor:  PO & supplemental intake, weight, labs, I/O's  Reason for Assessment: Malnutrition Screening Tool Report  80 y.o. female  Admitting Dx: SDH (subdural hematoma)  ASSESSMENT: 80 year old white female who fell yesterday and broke her hip. She did not strike her head. She did not lose consciousness. She came in with thrombocytopenia with platelet count of 21K. CT scan of the head showed some mild subdural blood layering over the tentorium and into inter-falcine space.  RD spoke with patient's son at bedside; patient pleasantly confused; son reports pt typically consumes 2-3 meals per day; PO intake 50% per flowsheet records; son also reports pt's lost ~ 6 lbs in the past few months; per wt readings, pt's had a 6% weight loss since November 2014 (not significant for time frame); son amenable to RD ordering nutrition supplements.  Per Neurosurgery note, patient is safe for her surgery once her thrombocytopenia has resolved or has been treated.  Nutrition Focused Physical Exam:  Subcutaneous Fat:  Orbital Region: N/A Upper Arm Region: severe depletion Thoracic and Lumbar Region: N/A  Muscle:  Temple Region: mild to moderate depletion Clavicle Bone Region: severe depletion Clavicle and Acromion Bone Region: severe depletion Scapular Bone Region: N/A Dorsal Hand: N/A Patellar Region: N/A Anterior Thigh Region: N/A Posterior Calf Region: N/A  Edema: none  Patient meets criteria for severe malnutrition in the context of  chronic illnes as evidenced by severe muscle loss and severe subcutaneous fat loss.  Height: Ht Readings from Last 1 Encounters:  09/03/13 5\' 6"  (1.676 m)    Weight: Wt Readings from Last 1 Encounters:  09/03/13 111 lb 12.4 oz (50.7 kg)    Ideal Body Weight: 130 lb  % Ideal Body Weight: 85%  Wt Readings from Last 10 Encounters:  09/03/13 111 lb 12.4 oz (50.7 kg)  09/03/13 111 lb 12.4 oz (50.7 kg)  07/07/13 114 lb 6.4 oz (51.891 kg)  04/07/13 118 lb 8 oz (53.751 kg)  02/24/13 121 lb 3.2 oz (54.976 kg)  01/09/13 118 lb 12.8 oz (53.887 kg)  12/18/12 120 lb 9.6 oz (54.704 kg)  11/25/12 118 lb 14.4 oz (53.933 kg)  10/31/12 116 lb 6.4 oz (52.799 kg)  07/31/12 120 lb 14.4 oz (54.84 kg)    Usual Body Weight: 118 lb -- November 2014  % Usual Body Weight: 94%  BMI:  Body mass index is 18.05 kg/(m^2).  Estimated Nutritional Needs: Kcal: 1250-1450 Protein: 65-75 gm Fluid: >/= 1.5 L  Skin: Intact  Diet Order: Dysphagia 3, thin liquids  EDUCATION NEEDS: -No education needs identified at this time   Intake/Output Summary (Last 24 hours) at 09/03/13 1625 Last data filed at 09/03/13 1540  Gross per 24 hour  Intake    170 ml  Output   2600 ml  Net  -2430 ml    Labs:   Recent Labs Lab 09/03/13 0027 09/03/13 1213  NA 141 139  K 3.8 3.4*  CL 104 97  CO2  --  27  BUN 18 14  CREATININE 0.80 0.60  CALCIUM  --  8.9  GLUCOSE  116* 141*    Scheduled Meds: . methylPREDNISolone (SOLU-MEDROL) injection  30 mg Intravenous Q24H  . senna  1 tablet Oral BID    Continuous Infusions:   Past Medical History  Diagnosis Date  . Thrombocytopenia 2010  . Hypertension   . Heart murmur   . Wrist fracture, left   . Chronic idiopathic monocytosis   . Enlargement of lymph nodes     H/O small bilateral axillary lymph nodes  . Shortness of breath     with excertion    Past Surgical History  Procedure Laterality Date  . Hemorrhoid surgery    . Bunionectomy    . Soft  tissue cyst excision      X 5 occasions--cysts on scalp    Arthur Holms, RD, LDN Pager #: [phone] After-Hours Pager #: [phone]

## 2013-09-03 NOTE — Progress Notes (Signed)
Utilization Review Completed.Dalayla Aldredge T Dowell4/01/2014  

## 2013-09-03 NOTE — Consult Note (Signed)
ORTHOPAEDIC CONSULTATION  REQUESTING PHYSICIAN: Berle Mull, MD  Chief Complaint: Right hip pain  HPI: Ashley Lutz is a 80 y.o. female who complains of  right hip pain after a mechanical fall. She has acute severe pain on the right side, unable to move her leg, denies other injuries, has a history of chronic ITP, denies loss of consciousness. She normally walks with a cane. This occurred yesterday.  Past Medical History  Diagnosis Date  . Thrombocytopenia 2010  . Hypertension   . Heart murmur   . Wrist fracture, left   . Chronic idiopathic monocytosis   . Enlargement of lymph nodes     H/O small bilateral axillary lymph nodes  . Shortness of breath     with excertion   Past Surgical History  Procedure Laterality Date  . Hemorrhoid surgery    . Bunionectomy    . Soft tissue cyst excision      X 5 occasions--cysts on scalp   History   Social History  . Marital Status: Married    Spouse Name: N/A    Number of Children: N/A  . Years of Education: N/A   Social History Main Topics  . Smoking status: Never Smoker   . Smokeless tobacco: None  . Alcohol Use: No  . Drug Use: No  . Sexual Activity: None   Other Topics Concern  . None   Social History Narrative  . None   History reviewed. No pertinent family history. Allergies  Allergen Reactions  . Milk-Related Compounds Other (See Comments)    Lactose intolerant   Prior to Admission medications   Medication Sig Start Date End Date Taking? Authorizing Provider  amLODipine (NORVASC) 5 MG tablet Take 5 mg by mouth daily.   Yes Historical Provider, MD  calcium-vitamin D (OSCAL WITH D) 250-125 MG-UNIT per tablet Take 1 tablet by mouth daily.   Yes Historical Provider, MD  Multiple Vitamins-Minerals (CENTRUM SILVER PO) Take 1 tablet by mouth daily.   Yes Historical Provider, MD  ramipril (ALTACE) 10 MG capsule Take 10 mg by mouth daily.   Yes Historical Provider, MD   Dg Chest 1 View  09/03/2013   CLINICAL DATA:   Fall.  EXAM: CHEST - 1 VIEW  COMPARISON:  12/01/2012  FINDINGS: Lungs are adequately inflated with mild prominence of the perihilar markings likely mild vascular congestion. Borderline cardiomegaly. There is calcified plaque over the thoracic aorta. There are degenerate changes spinal bowel curvature of the thoracic spine convex to the right.  IMPRESSION: Borderline cardiomegaly and suggestion of mild vascular congestion.   Electronically Signed   By: Marin Olp M.D.   On: 09/03/2013 01:00   Dg Hip Complete Right  09/03/2013   CLINICAL DATA:  Fall.  EXAM: RIGHT HIP - COMPLETE 2+ VIEW  COMPARISON:  12/01/2012  FINDINGS: There is diffuse decreased bone mineralization. There are symmetric mild-to-moderate degenerative changes of the hips. There is a mildly displaced subcapital fracture of the right femoral neck. There are degenerative changes of the spine. Calcified plaque is present over the iliac and femoral arteries.  IMPRESSION: Displaced subcapital fracture of the right femoral neck.   Electronically Signed   By: Marin Olp M.D.   On: 09/03/2013 00:58   Ct Head Wo Contrast  09/03/2013   ADDENDUM REPORT: 09/03/2013 03:14  ADDENDUM: Critical Value/emergent results were called by telephone at the time of interpretation on 09/03/2013 at 3:13 AM to [facility] , who verbally acknowledged these results.   Electronically Signed  By: Elon Alas   On: 09/03/2013 03:14   09/03/2013   CLINICAL DATA:  Fall.  EXAM: CT HEAD WITHOUT CONTRAST  TECHNIQUE: Contiguous axial images were obtained from the base of the skull through the vertex without intravenous contrast.  COMPARISON:  None available for comparison at time of study interpretation.  IMPRESSION: The ventricles and sulci are normal for age. No intraparenchymal hemorrhage, mass effect nor midline shift. Patchy to confluent supratentorial white matter hypodensities are within normal range for patient's age and though non-specific suggest sequelae of chronic  small vessel ischemic disease. No acute large vascular territory infarcts.  Mild hyperdensity along the left cerebellar tentorium consistent with small subdural hematoma. 2 mm left parafalcine subdural hematoma with apparent trace left mesial frontal subarachnoid blood, axial 21/32. 4 mm right temporal subdural hematoma, axial 15/32. Basal cisterns are patent. Moderate calcific atherosclerosis of the carotid siphons.  Apparent small right temporal scalp hematoma versus redundant skin. No skull fracture. Visualized paranasal sinuses and mastoid aircells are well-aerated. The included ocular globes and orbital contents are non-suspicious. Status post bilateral ocular lens implants. Severe temporal mandibular osteoarthrosis.  Small left cerebellar tentorium, left parafalcine and right temporal subdural hematomas. Possible trace left mesial frontal subarachnoid hemorrhage. No skull fracture.  No intraparenchymal hemorrhage. Involutional changes. Moderate white matter changes suggest chronic small vessel ischemic disease.  Electronically Signed: By: Elon Alas On: 09/03/2013 03:09    Positive ROS: All other systems have been reviewed and were otherwise negative with the exception of those mentioned in the HPI and as above.  Physical Exam: General: Alert, no acute distress Cardiovascular: No pedal edema Respiratory: Mild labored breathing GI: No organomegaly, abdomen is soft and non-tender Skin: No lesions in the area of chief complaint, with the exception of some bruising Neurologic: Sensation intact distally Psychiatric: Patient is competent for consent with normal mood and affect Lymphatic: No axillary or cervical lymphadenopathy  MUSCULOSKELETAL: Right hip is shortened, positive log roll, EHL FHL are firing.  Assessment: Right displaced femoral neck fracture, with thrombocytopenia, and mild respiratory distress  Plan: This is an acute severe complicated injury, which threatened her  long-term survival and function. I have discussed the options with her, and she has elected for surgical management despite the risks. We will make sure that she is optimized prior to proceeding, possibly later today, depending on the input from the pulmonary critical care service and the internal hospitalist service.  The risks benefits and alternatives were discussed with the patient including but not limited to the risks of nonoperative treatment, versus surgical intervention including infection, bleeding, nerve injury, periprosthetic fracture, the need for revision surgery, dislocation, leg length discrepancy, blood clots, cardiopulmonary complications, morbidity, mortality, among others, and they were willing to proceed.       [name], MD Cell [phone]   09/03/2013 7:37 AM

## 2013-09-03 NOTE — Consult Note (Addendum)
CARDIOLOGY CONSULT NOTE  Patient ID: Ashley Lutz MRN: [id number] DOB/AGE: [birth year] 80 y.o.  Admit date: 09/02/2013 Referring Physician  Lottie Mussel, MD Primary Physician:  Mathews Argyle, MD Reason for Consultation  Pre operative CV exam, Aortic stenosis  HPI: Patient is a fairly active 80 year old Caucasian female with history of chronic ITP and low platelet count, ring followed by hematology, heart murmur, but otherwise fairly functional and lives with her son at home, was admitted after a fall with small subdural hematoma and also right hip fracture, needing right hip arthroplasty. Preoperative consultation was obtained due to severe aortic stenosis by echocardiogram done earlier today. Next  Patient is fairly functional, performs day-to-day activities and activities of daily living independently. She also likes to cook occasionally, states that she walks for about 100 feet every day many times and has to sit down because he generally feels slightly short of breath and weak. She denies any symptoms to suggest dizziness or syncope, denies any loss of consciousness when she fell, no shortness of breath that is obvious, no PND or orthopnea. She uses only one pillow sleep.  No history to suggest palpitations, TIA, claudication. She has never smoked. There is no history to suggest any GI bleed, dark stools or hematochezia. No known bleeding diathesis in spite of chronic osteopenia.  Past Medical History  Diagnosis Date  . Thrombocytopenia 2010  . Hypertension   . Heart murmur   . Wrist fracture, left   . Chronic idiopathic monocytosis   . Enlargement of lymph nodes     H/O small bilateral axillary lymph nodes  . Shortness of breath     with excertion     Past Surgical History  Procedure Laterality Date  . Hemorrhoid surgery    . Bunionectomy    . Soft tissue cyst excision      X 5 occasions--cysts on scalp     History reviewed. No pertinent family history.   Social  History: History   Social History  . Marital Status: Married    Spouse Name: N/A    Number of Children: N/A  . Years of Education: N/A   Occupational History  . Not on file.   Social History Main Topics  . Smoking status: Never Smoker   . Smokeless tobacco: Not on file  . Alcohol Use: No  . Drug Use: No  . Sexual Activity: Not on file   Other Topics Concern  . Not on file   Social History Narrative  . No narrative on file     Prescriptions prior to admission  Medication Sig Dispense Refill  . amLODipine (NORVASC) 5 MG tablet Take 5 mg by mouth daily.      . calcium-vitamin D (OSCAL WITH D) 250-125 MG-UNIT per tablet Take 1 tablet by mouth daily.      . Multiple Vitamins-Minerals (CENTRUM SILVER PO) Take 1 tablet by mouth daily.      . ramipril (ALTACE) 10 MG capsule Take 10 mg by mouth daily.        Scheduled Meds: . methylPREDNISolone (SOLU-MEDROL) injection  30 mg Intravenous Q24H  . senna  1 tablet Oral BID   Continuous Infusions:  PRN Meds:.bisacodyl, HYDROcodone-acetaminophen, morphine injection, ondansetron  ROS: General: no fevers/chills/night sweats Eyes: no blurry vision, diplopia, or amaurosis ENT: no sore throat or hearing loss Resp: no cough, wheezing, or hemoptysis CV: no edema or palpitations GI: no abdominal pain, nausea, vomiting, diarrhea, or constipation GU: no dysuria, frequency, or hematuria Skin: no rash  Heme: no bleeding, DVT, or easy bruising Endo: no polydipsia or polyuria    Physical Exam: Blood pressure 148/69, pulse 77, temperature 98.4 F (36.9 C), temperature source Oral, resp. rate 13, height 5\' 6"  (1.676 m), weight 50.7 kg (111 lb 12.4 oz), SpO2 98.00%.   General appearance: alert, cooperative, appears stated age and no distress Lungs: clear to auscultation bilaterally Chest wall: no tenderness Heart: S1 is normal, S2 is muffled. There is a 3/6 ejection systolic murmur heard in the apex and also right sternal border,  conducted to the carotids. No gallop appreciated. Abdomen: soft, non-tender; bowel sounds normal; no masses,  no organomegaly Extremities: right hip is immobilized. No edema. Warm Pulses: 2+ and symmetric Neurologic: Grossly normal  Labs:   Lab Results  Component Value Date   WBC 12.4* 09/03/2013   HGB 10.4* 09/03/2013   HCT 32.0* 09/03/2013   MCV 92.2 09/03/2013   PLT 59* 09/03/2013    Recent Labs Lab 09/03/13 1213  NA 139  K 3.4*  CL 97  CO2 27  BUN 14  CREATININE 0.60  CALCIUM 8.9  PROT 8.0  BILITOT 0.5  ALKPHOS 63  ALT 12  AST 27  GLUCOSE 141*   No results found for this basename: CKTOTAL, CKMB, CKMBINDEX, TROPONINI    Lipid Panel  No results found for this basename: chol, trig, hdl, cholhdl, vldl, ldlcalc    EKG: Normal sinus rhythm, possible left atrial abnormality, leftward axis, no evidence of ischemia. Otherwise normal EKG.   Radiology: Reports have been reviewed.  ASSESSMENT AND PLAN:  1. Severe aortic stenosis with a aortic valve area of 0.74 cm, mean gradient of 69 mm mercury. Echocardiogram performed on 09/03/2013 revealing normal left systolic function. 2. Mild mitral stenosis with a mean gradient of 7 mm mercury. 3. Myelodysplastic syndrome, chronic thrombus cytopenia 4. Right hip fracture, small subdural hematoma secondary to fall. No history of syncope.  Recommendation: Patient has severe aortic stenosis probably critical aortic stenosis especially in view of the mean gradient of 69 mm mercury. However fortunately she has been stable with regard to this previously without clinical evidence of angina pectoris, congestive heart failure or syncope.  She'll be at a high risk for perioperative cardiac events due to the fact of severe aortic stenosis, however her lifestyle would be extremely compromised with regard to ADL in a patient who is 80 years of age, who lived independently up to now. Hence probably proceeding with right hip arthroplasty would be the most  optimal thing to do.  I will request Dr. Sherren Mocha to evaluate her today to see whether palliative aortic valve balloon valvuloplasty be appropriate prior to her going to surgery tomorrow. Otherwise no specific indications for my standpoint,  If indeed she were to go through surgery, I would avoid hypotension, would increase IV fluids at a high rate of at least 100 - 150 cc per hour for at least 4-6 hours post procedure, also have a low threshold for blood transfusion.  For now I would avoid hypotension, I will discontinue Altase is for now and if BP becomes an issue use  amlodipine to 5 mg by mouth daily. Do not use diuretics for now.   Laverda Page, MD 09/03/2013, 2:10 PM [facility] [place] Pager: [phone] Office: [phone] If no answer Cell [phone]

## 2013-09-03 NOTE — Progress Notes (Signed)
Spoke with Dr. Maryland Pink regarding surgical clearance, and she is number of outstanding issues pending, including thrombocytopenia, pulmonary dysfunction, severe aortic stenosis, and an intracranial hemorrhage.  We will plan to delay surgery to insure that she is medically optimized, and possible surgical intervention tomorrow if her conditions are optimized. The patient is very clear that she does want surgery, despite the risks.  [name], MD

## 2013-09-03 NOTE — Consult Note (Addendum)
PULMONARY / CRITICAL CARE MEDICINE   Name: Ashley Lutz MRN: [id number] DOB: 1918-10-20    ADMISSION DATE:  09/02/2013 CONSULTATION DATE:  09/02/13  REFERRING MD :  Dr. Berle Mull PRIMARY SERVICE: [facility]  CHIEF COMPLAINT:  Right hip fracture s/p fall  BRIEF PATIENT DESCRIPTION:  Patient is a 80 year old woman with a PMH of chronic ITP managed by Hematologist Dr. Benay Spice, HTN, chronic idiopathic monocytosis, who presents to the ED after a fall. She was found to have right hip fracture and head CT showed small left cerebellar tentorium, left parafalcine and right temporal subdural hematomas, with possible trace left mesial frontal subarachnoid hemorrhage. She was also noted to have respiratory distress with O2Sat 84%. NRB mask was initiated with O2 Sat up to 96%. Per RN report, prior to transfer from 2W to [facility], she was on 6L [place], which she remains on now, saturating 95-100%.  PCCM is consulted for respiratory failure.   SIGNIFICANT EVENTS / STUDIES:  4/8 Right hip fracture and brain   LINES / TUBES: None  CULTURES: None  ANTIBIOTICS: None  HISTORY OF PRESENT ILLNESS:   Patient is a 80 year old woman with a PMH of chronic ITP managed by Hematologist Dr. Benay Spice, HTN, chronic idiopathic monocytosis, who presents to the ED after a fall when trying to stand. Regarding fall, she denies HA, dizziness, lightheadedness.  She denies head trauma or loss of consciousness.  She uses a cane to walk.  At bedside, patient denies symptoms of respiratory distress since admission.  She has no known pulmonary history.  She denies shortness of breath, cough, chest pain, heart palpitations, wheezing.  No recent illness.  Her only complaint is right hip pain, the worst she has ever been in.   PAST MEDICAL HISTORY :  Past Medical History  Diagnosis Date  . Thrombocytopenia 2010  . Hypertension   . Heart murmur   . Wrist fracture, left   . Chronic idiopathic monocytosis   . Enlargement  of lymph nodes     H/O small bilateral axillary lymph nodes  . Shortness of breath     with excertion   Past Surgical History  Procedure Laterality Date  . Hemorrhoid surgery    . Bunionectomy    . Soft tissue cyst excision      X 5 occasions--cysts on scalp   Prior to Admission medications   Medication Sig Start Date End Date Taking? Authorizing Provider  amLODipine (NORVASC) 5 MG tablet Take 5 mg by mouth daily.   Yes Historical Provider, MD  calcium-vitamin D (OSCAL WITH D) 250-125 MG-UNIT per tablet Take 1 tablet by mouth daily.   Yes Historical Provider, MD  Multiple Vitamins-Minerals (CENTRUM SILVER PO) Take 1 tablet by mouth daily.   Yes Historical Provider, MD  ramipril (ALTACE) 10 MG capsule Take 10 mg by mouth daily.   Yes Historical Provider, MD   Allergies  Allergen Reactions  . Milk-Related Compounds Other (See Comments)    Lactose intolerant    FAMILY HISTORY:  History reviewed. No pertinent family history. SOCIAL HISTORY:  reports that she has never smoked. She does not have any smokeless tobacco history on file. She reports that she does not drink alcohol or use illicit drugs.  REVIEW OF SYSTEMS:  Pertinent +/- per HPI  SUBJECTIVE:   VITAL SIGNS: Temp:  [97.4 F (36.3 C)-98.3 F (36.8 C)] 97.4 F (36.3 C) (04/09 0420) Pulse Rate:  [72-92] 72 (04/09 0420) Resp:  [17-22] 22 (04/09 0420)  BP: (132-164)/(67-103) 164/84 mmHg (04/09 0420) SpO2:  [85 %-99 %] 92 % (04/09 0420) HEMODYNAMICS:   VENTILATOR SETTINGS:   INTAKE / OUTPUT: Intake/Output   None     PHYSICAL EXAMINATION: General:  Pleasant, no acute distress, appears as stated age Neuro:  AAO x 3, appropriate, follows commands, good alternating hand mvmts with some apraxia, no pronator drift, CN 2-12 grossly intact, strength 5/5 b/l UE & LE, sensation grossly intact HEENT:  PERRL, no scleral icterus, no conjunctival pallor Cardiovascular:  5/6 SEM with carotid radiation, RRR, peripheral pulses  intact Lungs:  CTA b/l without wheezing/rales/rhonchi Abdomen:  Soft, nontender, nondistended, normoactive BS Musculoskeletal:  Tenderness over right hip, +b/l pedal edema without calf tenderness Skin:  Petechiae on b/l LE  LABS:  CBC  Recent Labs Lab 09/02/13 2355 09/03/13 0027  WBC 8.3  --   HGB 10.6* 11.6*  HCT 32.6* 34.0*  PLT 21*  --    Coag's  Recent Labs Lab 09/02/13 2355  INR 1.14   BMET  Recent Labs Lab 09/03/13 0027  NA 141  K 3.8  CL 104  BUN 18  CREATININE 0.80  GLUCOSE 116*   Electrolytes No results found for this basename: CALCIUM, MG, PHOS,  in the last 168 hours Sepsis Markers No results found for this basename: LATICACIDVEN, PROCALCITON, O2SATVEN,  in the last 168 hours ABG  Recent Labs Lab 09/03/13 0526  PHART 7.454*  PCO2ART 35.4  PO2ART 64.3*   Liver Enzymes No results found for this basename: AST, ALT, ALKPHOS, BILITOT, ALBUMIN,  in the last 168 hours Cardiac Enzymes No results found for this basename: TROPONINI, PROBNP,  in the last 168 hours Glucose No results found for this basename: GLUCAP,  in the last 168 hours  Imaging Dg Chest 1 View  09/03/2013   CLINICAL DATA:  Fall.  EXAM: CHEST - 1 VIEW  COMPARISON:  12/01/2012  FINDINGS: Lungs are adequately inflated with mild prominence of the perihilar markings likely mild vascular congestion. Borderline cardiomegaly. There is calcified plaque over the thoracic aorta. There are degenerate changes spinal bowel curvature of the thoracic spine convex to the right.  IMPRESSION: Borderline cardiomegaly and suggestion of mild vascular congestion.   Electronically Signed   By: Marin Olp M.D.   On: 09/03/2013 01:00   Dg Hip Complete Right  09/03/2013   CLINICAL DATA:  Fall.  EXAM: RIGHT HIP - COMPLETE 2+ VIEW  COMPARISON:  12/01/2012  FINDINGS: There is diffuse decreased bone mineralization. There are symmetric mild-to-moderate degenerative changes of the hips. There is a mildly displaced  subcapital fracture of the right femoral neck. There are degenerative changes of the spine. Calcified plaque is present over the iliac and femoral arteries.  IMPRESSION: Displaced subcapital fracture of the right femoral neck.   Electronically Signed   By: Marin Olp M.D.   On: 09/03/2013 00:58   Ct Head Wo Contrast  09/03/2013   ADDENDUM REPORT: 09/03/2013 03:14  ADDENDUM: Critical Value/emergent results were called by telephone at the time of interpretation on 09/03/2013 at 3:13 AM to [facility] , who verbally acknowledged these results.   Electronically Signed   By: Elon Alas   On: 09/03/2013 03:14   09/03/2013   CLINICAL DATA:  Fall.  EXAM: CT HEAD WITHOUT CONTRAST  TECHNIQUE: Contiguous axial images were obtained from the base of the skull through the vertex without intravenous contrast.  COMPARISON:  None available for comparison at time of study interpretation.  IMPRESSION: The ventricles and  sulci are normal for age. No intraparenchymal hemorrhage, mass effect nor midline shift. Patchy to confluent supratentorial white matter hypodensities are within normal range for patient's age and though non-specific suggest sequelae of chronic small vessel ischemic disease. No acute large vascular territory infarcts.  Mild hyperdensity along the left cerebellar tentorium consistent with small subdural hematoma. 2 mm left parafalcine subdural hematoma with apparent trace left mesial frontal subarachnoid blood, axial 21/32. 4 mm right temporal subdural hematoma, axial 15/32. Basal cisterns are patent. Moderate calcific atherosclerosis of the carotid siphons.  Apparent small right temporal scalp hematoma versus redundant skin. No skull fracture. Visualized paranasal sinuses and mastoid aircells are well-aerated. The included ocular globes and orbital contents are non-suspicious. Status post bilateral ocular lens implants. Severe temporal mandibular osteoarthrosis.  Small left cerebellar tentorium, left  parafalcine and right temporal subdural hematomas. Possible trace left mesial frontal subarachnoid hemorrhage. No skull fracture.  No intraparenchymal hemorrhage. Involutional changes. Moderate white matter changes suggest chronic small vessel ischemic disease.  Electronically Signed: By: Elon Alas On: 09/03/2013 03:09     Admission CXR: b/l mild edema with likely interstitial disease   ASSESSMENT / PLAN:  PULMONARY A: Acute hypoxic respiratory failure with mild pulmonary edema, now saturating 95-100% on 6L [place], concern for cardiogenic etiology, murmur, regurg  P:   - Continuous pulse ox with O2 support - Lasix 40mg  IV x 1 - 2D Echo as ordered  CARDIOVASCULAR A: +SEM, likely MR but possible AS, HTN P:  - Hold home amlodipine/ramipril for now - Lasix as above - Echo as above - CE per primary  RENAL A:  No active issues P:   - Monitor prn  GASTROINTESTINAL A:  No active issues P:   - Dulcolax/Senokot prn  HEMATOLOGIC A:  Chronic ITP with Thrombocytopenia (baseline seems to be ~30-40), Normocytic Anemia (Hb at baseline ~11) P:  - Hematology consulted by primary - Steroids with platelet transfusion as ordered - CBC post transfusion, may be dificult to have a goal plat count as will be short lived half life plat even after Tx - If no significant response, may consider IVIG - SCDs for VTE ppx  INFECTIOUS A:  No active issues P:   - Monitor prn  ENDOCRINE A:  No active issues P:   - Monitor glucose on BMET  NEUROLOGIC A:  Subdural Hematoma with subarachnoid bleed, good mentation without neuro deficits; given functional platelet status, do not suspect hemorrhage is secondary to ITP, ?possible trauma P:   - Repeat head CT in AM, sooner if clinical status deteriorates (ordered) - Neuro checks q2h -plat Transfusion -echo assessment  MSK A: Displaced subcapital fracture of right femoral neck s/p fall P:  - Ortho consulted - Pain mgmt per  primary  GLOBAL -Full Code  TODAY'S SUMMARY: plat Tx, cbc tofollow, ct follow up, echo, low threshold to 3100  I have personally obtained a history, examined the patient, evaluated laboratory and imaging results, formulated the assessment and plan and placed orders. Lavon Paganini. Titus Mould, MD, [place] Pgr: [facility]  Pulmonary and [place] Pager: [phone]  09/03/2013, 6:04 AM

## 2013-09-03 NOTE — H&P (Signed)
[facility] History and Physical  Patient: Ashley Lutz  WLN:[id number]  DOB: 05/04/1919  DOS: the patient was seen and examined on 09/03/2013 PCP: Mathews Argyle, MD  Chief Complaint: Fall  HPI: Ashley Lutz is a 80 y.o. female with Past medical history of chronic low platelets, hypertension, heart murmur. The patient presented with complaints of a fall. She does not remember a call the fall happened and she was alone. As per the son he was in another room when he heard a thudd. He came to her mother's room and found that she was on the ground awake and was complaining of pain on the right side. Patient denies any dizziness, lightheadedness, fever, chills, chest pain, shortness of breath, nausea, vomiting, abdominal pain, burning urination, diarrhea, focal neurological deficits prior to the fall and at the time of my evaluation as well. She mentions that she was in the recliner and was trying to walk out of the recliner but she does not remember how she had a fall. As per the son patient does not have any history of recurrent fall or balance issues. There is no recent medication change. And there is no significant complain prior to the fall.  The patient is coming from home. And at her baseline Independent for most of her  ADL.  Review of Systems: as mentioned in the history of present illness.  A Comprehensive review of the other systems is negative.  Past Medical History  Diagnosis Date  . Thrombocytopenia 2010  . Hypertension   . Heart murmur   . Wrist fracture, left   . Chronic idiopathic monocytosis   . Enlargement of lymph nodes     H/O small bilateral axillary lymph nodes  . Shortness of breath     with excertion   Past Surgical History  Procedure Laterality Date  . Hemorrhoid surgery    . Bunionectomy    . Soft tissue cyst excision      X 5 occasions--cysts on scalp   Social History:  reports that she has never smoked. She does not have any  smokeless tobacco history on file. She reports that she does not drink alcohol or use illicit drugs.  Allergies  Allergen Reactions  . Milk-Related Compounds Other (See Comments)    Lactose intolerant    History reviewed. No pertinent family history.  Prior to Admission medications   Medication Sig Start Date End Date Taking? Authorizing Provider  amLODipine (NORVASC) 5 MG tablet Take 5 mg by mouth daily.   Yes Historical Provider, MD  calcium-vitamin D (OSCAL WITH D) 250-125 MG-UNIT per tablet Take 1 tablet by mouth daily.   Yes Historical Provider, MD  Multiple Vitamins-Minerals (CENTRUM SILVER PO) Take 1 tablet by mouth daily.   Yes Historical Provider, MD  ramipril (ALTACE) 10 MG capsule Take 10 mg by mouth daily.   Yes Historical Provider, MD    Physical Exam: Filed Vitals:   09/03/13 0230 09/03/13 0250 09/03/13 0330 09/03/13 0420  BP: 139/72   164/84  Pulse:   88 72  Temp:    97.4 F (36.3 C)  TempSrc:    Oral  Resp:   20 22  SpO2: 99% 95% 99% 92%    General: Alert, Awake and Oriented to Time, Place and Person. Appear in marked distress Eyes: PERRL ENT: Oral Mucosa clear moist. Neck: No JVD Cardiovascular: S1 and S2 Present, aortic systolic radiating to carotid Murmur, Peripheral Pulses Present Respiratory: Bilateral Air entry equal and Decreased, faint basal  Crackles, no wheezes Abdomen: Bowel Sound Present, Soft and Non tender Skin: No Rash Extremities: Bilateral Pedal edema, no calf tenderness, tenderness at the right hip  Neurologic: Grossly Unremarkable.  Labs on Admission:  CBC:  Recent Labs Lab 09/02/13 2355 09/03/13 0027  WBC 8.3  --   HGB 10.6* 11.6*  HCT 32.6* 34.0*  MCV 92.9  --   PLT 21*  --     CMP     Component Value Date/Time   NA 141 09/03/2013 0027   K 3.8 09/03/2013 0027   CL 104 09/03/2013 0027   CO2 25 12/01/2012 0355   GLUCOSE 116* 09/03/2013 0027   BUN 18 09/03/2013 0027   CREATININE 0.80 09/03/2013 0027   CALCIUM 8.7 12/01/2012 0355    PROT 7.5 12/01/2012 0355   ALBUMIN 3.5 12/01/2012 0355   AST 15 12/01/2012 0355   ALT 12 12/01/2012 0355   ALKPHOS 54 12/01/2012 0355   BILITOT 0.8 12/01/2012 0355   GFRNONAA 74* 12/01/2012 0355   GFRAA 86* 12/01/2012 0355    No results found for this basename: LIPASE, AMYLASE,  in the last 168 hours No results found for this basename: AMMONIA,  in the last 168 hours  No results found for this basename: CKTOTAL, CKMB, CKMBINDEX, TROPONINI,  in the last 168 hours BNP (last 3 results) No results found for this basename: PROBNP,  in the last 8760 hours  Radiological Exams on Admission: Dg Chest 1 View  09/03/2013   CLINICAL DATA:  Fall.  EXAM: CHEST - 1 VIEW  COMPARISON:  12/01/2012  FINDINGS: Lungs are adequately inflated with mild prominence of the perihilar markings likely mild vascular congestion. Borderline cardiomegaly. There is calcified plaque over the thoracic aorta. There are degenerate changes spinal bowel curvature of the thoracic spine convex to the right.  IMPRESSION: Borderline cardiomegaly and suggestion of mild vascular congestion.   Electronically Signed   By: Marin Olp M.D.   On: 09/03/2013 01:00   Dg Hip Complete Right  09/03/2013   CLINICAL DATA:  Fall.  EXAM: RIGHT HIP - COMPLETE 2+ VIEW  COMPARISON:  12/01/2012  FINDINGS: There is diffuse decreased bone mineralization. There are symmetric mild-to-moderate degenerative changes of the hips. There is a mildly displaced subcapital fracture of the right femoral neck. There are degenerative changes of the spine. Calcified plaque is present over the iliac and femoral arteries.  IMPRESSION: Displaced subcapital fracture of the right femoral neck.   Electronically Signed   By: Marin Olp M.D.   On: 09/03/2013 00:58   Ct Head Wo Contrast  09/03/2013   ADDENDUM REPORT: 09/03/2013 03:14  ADDENDUM: Critical Value/emergent results were called by telephone at the time of interpretation on 09/03/2013 at 3:13 AM to [facility] , who verbally  acknowledged these results.   Electronically Signed   By: Elon Alas   On: 09/03/2013 03:14   09/03/2013   CLINICAL DATA:  Fall.  EXAM: CT HEAD WITHOUT CONTRAST  TECHNIQUE: Contiguous axial images were obtained from the base of the skull through the vertex without intravenous contrast.  COMPARISON:  None available for comparison at time of study interpretation.  IMPRESSION: The ventricles and sulci are normal for age. No intraparenchymal hemorrhage, mass effect nor midline shift. Patchy to confluent supratentorial white matter hypodensities are within normal range for patient's age and though non-specific suggest sequelae of chronic small vessel ischemic disease. No acute large vascular territory infarcts.  Mild hyperdensity along the left cerebellar tentorium consistent with small subdural  hematoma. 2 mm left parafalcine subdural hematoma with apparent trace left mesial frontal subarachnoid blood, axial 21/32. 4 mm right temporal subdural hematoma, axial 15/32. Basal cisterns are patent. Moderate calcific atherosclerosis of the carotid siphons.  Apparent small right temporal scalp hematoma versus redundant skin. No skull fracture. Visualized paranasal sinuses and mastoid aircells are well-aerated. The included ocular globes and orbital contents are non-suspicious. Status post bilateral ocular lens implants. Severe temporal mandibular osteoarthrosis.  Small left cerebellar tentorium, left parafalcine and right temporal subdural hematomas. Possible trace left mesial frontal subarachnoid hemorrhage. No skull fracture.  No intraparenchymal hemorrhage. Involutional changes. Moderate white matter changes suggest chronic small vessel ischemic disease.  Electronically Signed: By: Elon Alas On: 09/03/2013 03:09    EKG: Independently reviewed. normal sinus rhythm, nonspecific ST and T waves changes.  Assessment/Plan Principal Problem:   SDH (subdural hematoma) Active Problems:   Hip fracture    Myelodysplastic syndrome, unspecified   Chronic ITP (idiopathic thrombocytopenia)   Acute respiratory failure with hypoxia   Aortic heart murmur   1. SDH (subdural hematoma) The patient presented with a mechanical fall. After the fall due to her thrombocytopenia a CT scan was done which is positive for multiple subdural hematoma. Trauma surgery and neurosurgery has been consulted who will be evaluating the patient. At present there is no indication for further imaging as she does not have any further signs of external trauma as well as there is no indication for acute surgery as per neurosurgery. I subdural hematoma could be secondary to chronic thrombocytopenia. I have discussed with on-call hematologist who recommended to start the patient on IV Solu-Medrol 30 mg daily and one unit of platelet transfusion. At present patient does not have any neurological deficit I would continue neurochecks every 4 hours. Patient will be admitted to step down unit since she is considering remaining full code.  2. Hip fracture The patient has subcapital right hip fracture. Prior to the hip fracture she may require further consultation related to her chronic thrombocytopenia and acute hypoxia as well as aortic systolic murmur. At present the patient will be on bedrest. Pain management with morphine. Patient is very sensitive to narcotics and therefore we will be judicious in use of narcotic pain medication. Gentle IV hydration and monitoring the patient on telemetry  3. Acute hypoxic respiratory failure Patient is requiring 6 L of oxygen to maintain adequate saturation. Her chest x-ray shows mild congestion she has significant aortic murmur which could be potentially significant aortic stenosis but at present does not have respiratory adventitious sounds on examination. With this pulmonary critical care has been consulted for further input and assistance in the management of the patient. ABG is requested. Patient  is transported to step down unit.  4. aortic murmur  Follow serial troponins and limited echocardiogram  Consults:  Critical care, trauma surgery, neurosurgery, hematology  DVT Prophylaxis: mechanical compression device Nutrition:  N.p.o.  Code Status:  Full, as per patient's son she may have a living will which may have been DO NOT RESUSCITATE DO NOT INTUBATE documented but at present the patient appears to understand her situation to her son and is oriented and is requesting to remain full code.  Family Communication:  Son was present at bedside, opportunity was given to ask question and all questions were answered satisfactorily at the time of interview. Disposition: Admitted to inpatient in step-down unit.  Author: Berle Mull, MD [facility] Pager: [phone] 09/03/2013, 5:11 AM    If 7PM-7AM, please contact  night-coverage [email or web address] Password [email or web address]

## 2013-09-03 NOTE — ED Provider Notes (Addendum)
History provided by patient. Sitting in chair, got up and fell landing on her right hip. Brought in by EMS dry hip pain unable to bear weight. On exam has tenderness over the area the greater trochanter. She has some external rotation no shortening. No tenderness over the knee, ankle or foot. Distal neurovascular intact. X-rays obtained and reviewed. IV narcotics pain control. EKG reviewed. Plan admit medicine with orthopedic consult.  Diagnosis right hip fracture    Date: 09/03/2013  Rate: 85  Rhythm: normal sinus rhythm  QRS Axis: normal  Intervals: normal  ST/T Wave abnormalities: nonspecific ST changes  Conduction Disutrbances:Suspect right and left arm lead reversal  Narrative Interpretation:   Old EKG Reviewed: Suspect right and left arm lead reversal    Teressa Lower, MD 09/03/13 0054  1:19 AM discussed with orthopedics Dr. Mardelle Matte will evaluate in the morning and plan OR today.  Plan hospitalist admission.  Teressa Lower, MD 09/03/13 [phone]

## 2013-09-04 ENCOUNTER — Inpatient Hospital Stay (HOSPITAL_COMMUNITY): Payer: Medicare HMO

## 2013-09-04 ENCOUNTER — Encounter (HOSPITAL_COMMUNITY): Payer: Medicare HMO | Admitting: Anesthesiology

## 2013-09-04 ENCOUNTER — Inpatient Hospital Stay (HOSPITAL_COMMUNITY): Payer: Medicare HMO | Admitting: Anesthesiology

## 2013-09-04 ENCOUNTER — Encounter (HOSPITAL_COMMUNITY)
Admission: EM | Disposition: A | Discharge status: Skilled Nursing Facility | Payer: Self-pay | Source: Home / Self Care | Attending: Internal Medicine

## 2013-09-04 DIAGNOSIS — D696 Thrombocytopenia, unspecified: Secondary | ICD-10-CM

## 2013-09-04 DIAGNOSIS — S72033A Displaced midcervical fracture of unspecified femur, initial encounter for closed fracture: Secondary | ICD-10-CM | POA: Diagnosis present

## 2013-09-04 DIAGNOSIS — D649 Anemia, unspecified: Secondary | ICD-10-CM

## 2013-09-04 DIAGNOSIS — E43 Unspecified severe protein-calorie malnutrition: Secondary | ICD-10-CM

## 2013-09-04 DIAGNOSIS — S72009A Fracture of unspecified part of neck of unspecified femur, initial encounter for closed fracture: Secondary | ICD-10-CM

## 2013-09-04 DIAGNOSIS — I6200 Nontraumatic subdural hemorrhage, unspecified: Secondary | ICD-10-CM

## 2013-09-04 DIAGNOSIS — Q068 Other specified congenital malformations of spinal cord: Secondary | ICD-10-CM

## 2013-09-04 DIAGNOSIS — I359 Nonrheumatic aortic valve disorder, unspecified: Secondary | ICD-10-CM

## 2013-09-04 HISTORY — PX: HIP ARTHROPLASTY: SHX981

## 2013-09-04 LAB — CBC
HEMATOCRIT: 33.2 % — AB (ref 36.0–46.0)
HEMOGLOBIN: 10.7 g/dL — AB (ref 12.0–15.0)
MCH: 29.9 pg (ref 26.0–34.0)
MCHC: 32.2 g/dL (ref 30.0–36.0)
MCV: 92.7 fL (ref 78.0–100.0)
Platelets: 51 10*3/uL — ABNORMAL LOW (ref 150–400)
RBC: 3.58 MIL/uL — ABNORMAL LOW (ref 3.87–5.11)
RDW: 17.4 % — AB (ref 11.5–15.5)
WBC: 15.7 10*3/uL — ABNORMAL HIGH (ref 4.0–10.5)

## 2013-09-04 LAB — PREPARE RBC (CROSSMATCH)

## 2013-09-04 LAB — BASIC METABOLIC PANEL
BUN: 16 mg/dL (ref 6–23)
CALCIUM: 9.3 mg/dL (ref 8.4–10.5)
CO2: 28 mEq/L (ref 19–32)
CREATININE: 0.55 mg/dL (ref 0.50–1.10)
Chloride: 97 mEq/L (ref 96–112)
GFR, EST NON AFRICAN AMERICAN: 78 mL/min — AB (ref >90)
GLUCOSE: 140 mg/dL — AB (ref 70–99)
POTASSIUM: 3.5 meq/L — AB (ref 3.7–5.3)
Sodium: 138 mEq/L (ref 137–147)

## 2013-09-04 LAB — POCT I-STAT 4, (NA,K, GLUC, HGB,HCT)
Glucose, Bld: 146 mg/dL — ABNORMAL HIGH (ref 70–99)
HCT: 22 % — ABNORMAL LOW (ref 36.0–46.0)
Hemoglobin: 7.5 g/dL — ABNORMAL LOW (ref 12.0–15.0)
Potassium: 3.1 mEq/L — ABNORMAL LOW (ref 3.7–5.3)
SODIUM: 139 meq/L (ref 137–147)

## 2013-09-04 LAB — HEMOGLOBIN AND HEMATOCRIT, BLOOD
HEMATOCRIT: 26.9 % — AB (ref 36.0–46.0)
Hemoglobin: 9 g/dL — ABNORMAL LOW (ref 12.0–15.0)

## 2013-09-04 LAB — PREPARE PLATELET PHERESIS: UNIT DIVISION: 0

## 2013-09-04 LAB — BLOOD PRODUCT ORDER (VERBAL) VERIFICATION

## 2013-09-04 SURGERY — HEMIARTHROPLASTY, HIP, DIRECT ANTERIOR APPROACH, FOR FRACTURE
Anesthesia: General | Site: Hip | Laterality: Right

## 2013-09-04 MED ORDER — ACETAMINOPHEN 650 MG RE SUPP: 650.0000 mg | Freq: Four times a day (QID) | RECTAL | Status: DC | PRN

## 2013-09-04 MED ORDER — FENTANYL CITRATE 0.05 MG/ML IJ SOLN
INTRAMUSCULAR | Status: DC | PRN
  Administered 2013-09-04 (×2): 50 ug via INTRAVENOUS

## 2013-09-04 MED ORDER — ALBUMIN HUMAN 5 % IV SOLN
INTRAVENOUS | Status: AC
  Administered 2013-09-04: 12.5 g
  Filled 2013-09-04: qty 250

## 2013-09-04 MED ORDER — SODIUM CHLORIDE 0.9 % IR SOLN
Status: DC | PRN
  Administered 2013-09-04: 1000 mL

## 2013-09-04 MED ORDER — ACETAMINOPHEN 10 MG/ML IV SOLN
1000.0000 mg | Freq: Once | INTRAVENOUS | Status: AC
  Filled 2013-09-04: qty 100

## 2013-09-04 MED ORDER — ONDANSETRON HCL 4 MG/2ML IJ SOLN
INTRAMUSCULAR | Status: AC
  Filled 2013-09-04: qty 2

## 2013-09-04 MED ORDER — ALUM & MAG HYDROXIDE-SIMETH 200-200-20 MG/5ML PO SUSP
30.0000 mL | ORAL | Status: DC | PRN
  Filled 2013-09-04: qty 30

## 2013-09-04 MED ORDER — LIDOCAINE HCL (CARDIAC) 20 MG/ML IV SOLN
INTRAVENOUS | Status: AC
  Filled 2013-09-04: qty 5

## 2013-09-04 MED ORDER — WHITE PETROLATUM GEL
Status: AC
  Administered 2013-09-04: 0.2
  Filled 2013-09-04: qty 5

## 2013-09-04 MED ORDER — ROCURONIUM BROMIDE 50 MG/5ML IV SOLN
INTRAVENOUS | Status: AC
  Filled 2013-09-04: qty 1

## 2013-09-04 MED ORDER — ETOMIDATE 2 MG/ML IV SOLN
INTRAVENOUS | Status: DC | PRN
  Administered 2013-09-04: 10 mg via INTRAVENOUS

## 2013-09-04 MED ORDER — MENTHOL 3 MG MT LOZG
1.0000 | LOZENGE | OROMUCOSAL | Status: DC | PRN
  Filled 2013-09-04: qty 9

## 2013-09-04 MED ORDER — LACTATED RINGERS IV SOLN
INTRAVENOUS | Status: DC
  Administered 2013-09-04: 13:00:00 via INTRAVENOUS

## 2013-09-04 MED ORDER — MAGNESIUM CITRATE PO SOLN: 1.0000 | Freq: Once | ORAL | Status: AC | PRN

## 2013-09-04 MED ORDER — BISACODYL 10 MG RE SUPP
10.0000 mg | Freq: Every day | RECTAL | Status: DC | PRN
Start: 2013-09-04 — End: 2013-09-10
  Administered 2013-09-06: 10 mg via RECTAL
  Filled 2013-09-04: qty 1

## 2013-09-04 MED ORDER — NEOSTIGMINE METHYLSULFATE 1 MG/ML IJ SOLN
INTRAMUSCULAR | Status: DC | PRN
  Administered 2013-09-04: 3 mg via INTRAVENOUS

## 2013-09-04 MED ORDER — GLYCOPYRROLATE 0.2 MG/ML IJ SOLN
INTRAMUSCULAR | Status: DC | PRN
  Administered 2013-09-04: .4 mg via INTRAVENOUS

## 2013-09-04 MED ORDER — ACETAMINOPHEN 10 MG/ML IV SOLN
INTRAVENOUS | Status: AC
  Administered 2013-09-04: 1000 mg
  Filled 2013-09-04: qty 100

## 2013-09-04 MED ORDER — SENNA-DOCUSATE SODIUM 8.6-50 MG PO TABS: 2.0000 | ORAL_TABLET | Freq: Every day | ORAL | Status: DC

## 2013-09-04 MED ORDER — CEFAZOLIN SODIUM-DEXTROSE 2-3 GM-% IV SOLR
INTRAVENOUS | Status: AC
  Administered 2013-09-04: 2 g via INTRAVENOUS
  Filled 2013-09-04: qty 50

## 2013-09-04 MED ORDER — GLYCOPYRROLATE 0.2 MG/ML IJ SOLN
INTRAMUSCULAR | Status: AC
  Filled 2013-09-04: qty 2

## 2013-09-04 MED ORDER — PHENOL 1.4 % MT LIQD
1.0000 | OROMUCOSAL | Status: DC | PRN
  Administered 2013-09-04: 1 via OROMUCOSAL
  Filled 2013-09-04: qty 177

## 2013-09-04 MED ORDER — POLYETHYLENE GLYCOL 3350 17 G PO PACK
17.0000 g | PACK | Freq: Every day | ORAL | Status: DC | PRN
  Administered 2013-09-06: 17 g via ORAL
  Filled 2013-09-04: qty 1

## 2013-09-04 MED ORDER — MORPHINE SULFATE 2 MG/ML IJ SOLN
0.5000 mg | INTRAMUSCULAR | Status: DC | PRN
  Administered 2013-09-05 – 2013-09-09 (×2): 0.5 mg via INTRAVENOUS
  Filled 2013-09-04 (×2): qty 1

## 2013-09-04 MED ORDER — CEFAZOLIN SODIUM-DEXTROSE 2-3 GM-% IV SOLR
2.0000 g | Freq: Four times a day (QID) | INTRAVENOUS | Status: AC
  Administered 2013-09-04 – 2013-09-05 (×2): 2 g via INTRAVENOUS
  Filled 2013-09-04 (×3): qty 50

## 2013-09-04 MED ORDER — ROCURONIUM BROMIDE 100 MG/10ML IV SOLN
INTRAVENOUS | Status: DC | PRN
  Administered 2013-09-04: 25 mg via INTRAVENOUS
  Administered 2013-09-04: 10 mg via INTRAVENOUS

## 2013-09-04 MED ORDER — LACTATED RINGERS IV SOLN
INTRAVENOUS | Status: DC | PRN
  Administered 2013-09-04 (×2): via INTRAVENOUS

## 2013-09-04 MED ORDER — PHENYLEPHRINE HCL 10 MG/ML IJ SOLN
10.0000 mg | INTRAVENOUS | Status: DC | PRN
  Administered 2013-09-04: 40 ug/min via INTRAVENOUS

## 2013-09-04 MED ORDER — DOCUSATE SODIUM 100 MG PO CAPS
100.0000 mg | ORAL_CAPSULE | Freq: Two times a day (BID) | ORAL | Status: DC
  Administered 2013-09-05 – 2013-09-10 (×8): 100 mg via ORAL
  Filled 2013-09-04 (×10): qty 1

## 2013-09-04 MED ORDER — FENTANYL CITRATE 0.05 MG/ML IJ SOLN
INTRAMUSCULAR | Status: AC
  Filled 2013-09-04: qty 5

## 2013-09-04 MED ORDER — HYDROCODONE-ACETAMINOPHEN 5-325 MG PO TABS: 1.0000 | ORAL_TABLET | Freq: Four times a day (QID) | ORAL | Status: DC | PRN

## 2013-09-04 MED ORDER — ONDANSETRON HCL 4 MG/2ML IJ SOLN
INTRAMUSCULAR | Status: AC
  Administered 2013-09-04: 4 mg
  Filled 2013-09-04: qty 2

## 2013-09-04 MED ORDER — CEFAZOLIN SODIUM-DEXTROSE 2-3 GM-% IV SOLR
2.0000 g | INTRAVENOUS | Status: DC
  Filled 2013-09-04: qty 50

## 2013-09-04 MED ORDER — HYDROCODONE-ACETAMINOPHEN 5-325 MG PO TABS
1.0000 | ORAL_TABLET | Freq: Four times a day (QID) | ORAL | Status: DC | PRN
  Administered 2013-09-05 – 2013-09-09 (×10): 1 via ORAL
  Filled 2013-09-04 (×10): qty 1
  Filled 2013-09-04: qty 5

## 2013-09-04 MED ORDER — ALBUMIN HUMAN 5 % IV SOLN
INTRAVENOUS | Status: DC | PRN
  Administered 2013-09-04: 16:00:00 via INTRAVENOUS

## 2013-09-04 MED ORDER — ETOMIDATE 2 MG/ML IV SOLN
INTRAVENOUS | Status: AC
  Filled 2013-09-04: qty 10

## 2013-09-04 MED ORDER — POTASSIUM CHLORIDE IN NACL 20-0.45 MEQ/L-% IV SOLN
INTRAVENOUS | Status: DC
  Administered 2013-09-04: 22:00:00 via INTRAVENOUS
  Filled 2013-09-04 (×3): qty 1000

## 2013-09-04 MED ORDER — ACETAMINOPHEN 325 MG PO TABS: 650.0000 mg | ORAL_TABLET | Freq: Four times a day (QID) | ORAL | Status: DC | PRN

## 2013-09-04 MED ORDER — PROPOFOL 10 MG/ML IV BOLUS
INTRAVENOUS | Status: AC
  Filled 2013-09-04: qty 20

## 2013-09-04 MED ORDER — NEOSTIGMINE METHYLSULFATE 1 MG/ML IJ SOLN
INTRAMUSCULAR | Status: AC
  Filled 2013-09-04: qty 10

## 2013-09-04 SURGICAL SUPPLY — 50 items
BENZOIN TINCTURE PRP APPL 2/3 (GAUZE/BANDAGES/DRESSINGS) ×3 IMPLANT
BLADE SAW SGTL 18.5X63.X.64 HD (BLADE) ×3 IMPLANT
BRUSH FEMORAL CANAL (MISCELLANEOUS) IMPLANT
CAPT HIP FX BIPOLAR/UNIPOLAR ×3 IMPLANT
CLOSURE STERI-STRIP 1/2X4 (GAUZE/BANDAGES/DRESSINGS) ×1
CLSR STERI-STRIP ANTIMIC 1/2X4 (GAUZE/BANDAGES/DRESSINGS) ×2 IMPLANT
COVER SURGICAL LIGHT HANDLE (MISCELLANEOUS) ×3 IMPLANT
DRAPE ORTHO SPLIT 77X108 STRL (DRAPES) ×4
DRAPE SURG ORHT 6 SPLT 77X108 (DRAPES) ×2 IMPLANT
DRAPE U-SHAPE 47X51 STRL (DRAPES) ×3 IMPLANT
DRILL BIT 5/64 (BIT) ×3 IMPLANT
DRSG MEPILEX BORDER 4X12 (GAUZE/BANDAGES/DRESSINGS) IMPLANT
DRSG MEPILEX BORDER 4X8 (GAUZE/BANDAGES/DRESSINGS) ×6 IMPLANT
DURAPREP 26ML APPLICATOR (WOUND CARE) ×6 IMPLANT
ELECT CAUTERY BLADE 6.4 (BLADE) ×3 IMPLANT
ELECT REM PT RETURN 9FT ADLT (ELECTROSURGICAL) ×3
ELECTRODE REM PT RTRN 9FT ADLT (ELECTROSURGICAL) ×1 IMPLANT
FACESHIELD WRAPAROUND (MASK) ×6 IMPLANT
GLOVE BIOGEL PI ORTHO PRO SZ8 (GLOVE) ×6
GLOVE ORTHO TXT STRL SZ7.5 (GLOVE) ×3 IMPLANT
GLOVE PI ORTHO PRO STRL SZ8 (GLOVE) ×3 IMPLANT
GLOVE SURG ORTHO 8.0 STRL STRW (GLOVE) ×6 IMPLANT
GOWN STRL REUS W/ TWL XL LVL3 (GOWN DISPOSABLE) ×1 IMPLANT
GOWN STRL REUS W/TWL 2XL LVL3 (GOWN DISPOSABLE) ×6 IMPLANT
GOWN STRL REUS W/TWL XL LVL3 (GOWN DISPOSABLE) ×2
HANDPIECE INTERPULSE COAX TIP (DISPOSABLE) ×2
KIT BASIN OR (CUSTOM PROCEDURE TRAY) ×3 IMPLANT
KIT ROOM TURNOVER OR (KITS) ×3 IMPLANT
MANIFOLD NEPTUNE II (INSTRUMENTS) ×3 IMPLANT
NEEDLE 22X1 1/2 (OR ONLY) (NEEDLE) ×3 IMPLANT
NS IRRIG 1000ML POUR BTL (IV SOLUTION) ×3 IMPLANT
PACK TOTAL JOINT (CUSTOM PROCEDURE TRAY) ×3 IMPLANT
PAD ARMBOARD 7.5X6 YLW CONV (MISCELLANEOUS) ×6 IMPLANT
PASSER SUT SWANSON 36MM LOOP (INSTRUMENTS) IMPLANT
PILLOW ABDUCTION HIP (SOFTGOODS) ×3 IMPLANT
PRESSURIZER FEMORAL UNIV (MISCELLANEOUS) IMPLANT
RETRIEVER SUT HEWSON (MISCELLANEOUS) ×3 IMPLANT
SET HNDPC FAN SPRY TIP SCT (DISPOSABLE) ×1 IMPLANT
SUT FIBERWIRE #2 38 REV NDL BL (SUTURE) ×9
SUT MNCRL AB 4-0 PS2 18 (SUTURE) ×3 IMPLANT
SUT VIC AB 0 CT1 27 (SUTURE) ×4
SUT VIC AB 0 CT1 27XBRD ANBCTR (SUTURE) ×2 IMPLANT
SUT VIC AB 1 CT1 27 (SUTURE) ×2
SUT VIC AB 1 CT1 27XBRD ANBCTR (SUTURE) ×1 IMPLANT
SUT VIC AB 3-0 SH 8-18 (SUTURE) ×3 IMPLANT
SUTURE FIBERWR#2 38 REV NDL BL (SUTURE) ×3 IMPLANT
SYR CONTROL 10ML LL (SYRINGE) ×3 IMPLANT
TOWEL OR 17X24 6PK STRL BLUE (TOWEL DISPOSABLE) ×3 IMPLANT
TOWEL OR 17X26 10 PK STRL BLUE (TOWEL DISPOSABLE) ×3 IMPLANT
TOWER CARTRIDGE SMART MIX (DISPOSABLE) IMPLANT

## 2013-09-04 NOTE — Progress Notes (Addendum)
[facility] PROGRESS NOTE  JELIYAH MIDDLEBROOKS QMG:[id number] DOB: 1918/12/23 DOA: 09/02/2013 PCP: Mathews Argyle, MD  Assessment/Plan:   SDH (subdural hematoma): Agree with critical care, likely not from thrombocytopenia directly. Likely sustained from fall. Followup CT scan stable. Cleared by neurosurgery. No further workup needed unless mental status changes develop.  Principal problem:   Hip fracture: For operating room later today. Awaiting followup platelet count. Patient is high risk given aortic stenosis, however alternative option would leave her bed bound and be fatal regardless. Family understands risks.     Acute respiratory failure with hypoxia  Severe protein calorie malnutrition: Patient needs criteria in the context of chronic illness  Underweight: Patient is 111 lbs w/BMI of 18.1    Aortic stenosis, severe: Noted on echocardiogram.  appreciate cardiology assistance. They have spoken with anesthesia, will work on maintaining stable blood pressures during surgery. Holding diuretics.   Thrombocytopenia due to suspected myelodysplastic disorder: Seen by hematology. Patient responded to platelet transfusion. Awaiting repeat platelet count this morning. If less than 50, transfuse right before surgery.  Code Status: Full code  Family Communication: Son  and other family members at the bedside.  Disposition Plan:  will need skilled nursing after    Consultants:  Critical care-Feinstein  Cardiology-Ganji, Cooper  Hematology-Sherrill  Neurosurgery-Jones  Trauma surgery-Byerly  Orthopedic surgery-Landau  Procedures:  Echocardiogram done 4/9: Severe aortic stenosis  Antibiotics:  None  HPI/Subjective: Patient doing okay. no complaints other than being hungry   Objective: Filed Vitals:   09/04/13 0805  BP: 149/71  Pulse: 86  Temp: 98.5 F (36.9 C)  Resp: 23    Intake/Output Summary (Last 24 hours) at 09/04/13 1143 Last data filed at 09/04/13  0806  Gross per 24 hour  Intake    420 ml  Output   1375 ml  Net   -955 ml   Filed Weights   09/03/13 0600 09/04/13 0415  Weight: 50.7 kg (111 lb 12.4 oz) 49.9 kg (110 lb 0.2 oz)    Exam:   General:  Alert and oriented x2, no acute distress  Cardiovascular: Harsh 3/6 holosystolic murmur, regular rate and rhythm  Respiratory: Clear to auscultation bilaterally  Abdomen: Soft, nontender, nondistended, positive bowel sounds  Musculoskeletal: No clubbing or cyanosis or edema   Data Reviewed: Basic Metabolic Panel:  Recent Labs Lab 09/03/13 0027 09/03/13 1213  NA 141 139  K 3.8 3.4*  CL 104 97  CO2  --  27  GLUCOSE 116* 141*  BUN 18 14  CREATININE 0.80 0.60  CALCIUM  --  8.9   Liver Function Tests:  Recent Labs Lab 09/03/13 1213  AST 27  ALT 12  ALKPHOS 63  BILITOT 0.5  PROT 8.0  ALBUMIN 3.7   No results found for this basename: LIPASE, AMYLASE,  in the last 168 hours No results found for this basename: AMMONIA,  in the last 168 hours CBC:  Recent Labs Lab 09/02/13 2355 09/03/13 0027 09/03/13 1213  WBC 8.3  --  12.4*  NEUTROABS  --   --  10.3*  HGB 10.6* 11.6* 10.4*  HCT 32.6* 34.0* 32.0*  MCV 92.9  --  92.2  PLT 21*  --  59*   Cardiac Enzymes: No results found for this basename: CKTOTAL, CKMB, CKMBINDEX, TROPONINI,  in the last 168 hours BNP (last 3 results) No results found for this basename: PROBNP,  in the last 8760 hours CBG: No results found for this basename: GLUCAP,  in the last 168 hours  Recent Results (from the past 240 hour(s))  MRSA PCR SCREENING     Status: None   Collection Time    09/03/13  6:19 AM      Result Value Ref Range Status   MRSA by PCR NEGATIVE  NEGATIVE Final   Comment:            The GeneXpert MRSA Assay (FDA     approved for NASAL specimens     only), is one component of a     comprehensive MRSA colonization     surveillance program. It is not     intended to diagnose MRSA     infection nor to guide or      monitor treatment for     MRSA infections.     Studies: Dg Chest 1 View  09/03/2013   CLINICAL DATA:  Fall.  EXAM: CHEST - 1 VIEW  COMPARISON:  12/01/2012  FINDINGS: Lungs are adequately inflated with mild prominence of the perihilar markings likely mild vascular congestion. Borderline cardiomegaly. There is calcified plaque over the thoracic aorta. There are degenerate changes spinal bowel curvature of the thoracic spine convex to the right.  IMPRESSION: Borderline cardiomegaly and suggestion of mild vascular congestion.   Electronically Signed   By: Marin Olp M.D.   On: 09/03/2013 01:00   Dg Hip Complete Right  09/03/2013   CLINICAL DATA:  Fall.  EXAM: RIGHT HIP - COMPLETE 2+ VIEW  COMPARISON:  12/01/2012  FINDINGS: There is diffuse decreased bone mineralization. There are symmetric mild-to-moderate degenerative changes of the hips. There is a mildly displaced subcapital fracture of the right femoral neck. There are degenerative changes of the spine. Calcified plaque is present over the iliac and femoral arteries.  IMPRESSION: Displaced subcapital fracture of the right femoral neck.   Electronically Signed   By: Marin Olp M.D.   On: 09/03/2013 00:58   Ct Head Wo Contrast  09/04/2013   CLINICAL DATA:  subdural hematoma with subarachnoid hemorrhage  EXAM: CT HEAD WITHOUT CONTRAST  TECHNIQUE: Contiguous axial images were obtained from the base of the skull through the vertex without intravenous contrast.  COMPARISON:  Prior CT from 09/03/2013  FINDINGS: Study is somewhat degraded by motion artifact.  Layering hyperdensity along the left tentorium and posterior falx again seen, compatible with acute subdural hematoma. Overall appearance is unchanged. Focal hyperdensity along the anterior falx is also stable. Again, there may be a small amount of associated subarachnoid hemorrhage within this region (series 2, image 20). No significant mass effect. Thin subdural overlying the right temporal lobe  measures 3 mm in maximal diameter, unchanged. Subdural hemorrhage measuring up to 5 mm seen overlying the left occipital lobe (series 2, image 16), also stable. No new intracranial hemorrhage. Ventricles are stable in size without evidence of hydrocephalus. No intraventricular hemorrhage.  No mass lesion or midline shift. No acute large vessel territory infarct. Mild atrophy with chronic microvascular ischemic disease again noted.  Calvarium remains intact. Bilateral ocular lens implants noted. Orbits are otherwise unremarkable. Paranasal sinuses are clear. Partial opacification of the right mastoid air cells is stable from prior. Trace opacity noted within the left mastoid air cells as well.  Scalp soft tissues within normal limits.  IMPRESSION: 1. Stable subdural hemorrhage along the left tentorium and falx as above. Again, there may be a small amount of associated subarachnoid hemorrhage within the mesial left frontal lobe adjacent to the anterior falx. No significant mass effect. 2. Thin subdurals overlying the right  temporal and left occipital lobes, stable. No significant mass effect. 3. No new intracranial hemorrhage or hydrocephalus.   Electronically Signed   By: Jeannine Boga M.D.   On: 09/04/2013 06:03   Ct Head Wo Contrast  09/03/2013   ADDENDUM REPORT: 09/03/2013 03:14  ADDENDUM: Critical Value/emergent results were called by telephone at the time of interpretation on 09/03/2013 at 3:13 AM to [facility] , who verbally acknowledged these results.   Electronically Signed   By: Elon Alas   On: 09/03/2013 03:14   09/03/2013   CLINICAL DATA:  Fall.  EXAM: CT HEAD WITHOUT CONTRAST  TECHNIQUE: Contiguous axial images were obtained from the base of the skull through the vertex without intravenous contrast.  COMPARISON:  None available for comparison at time of study interpretation.  IMPRESSION: The ventricles and sulci are normal for age. No intraparenchymal hemorrhage, mass effect nor midline  shift. Patchy to confluent supratentorial white matter hypodensities are within normal range for patient's age and though non-specific suggest sequelae of chronic small vessel ischemic disease. No acute large vascular territory infarcts.  Mild hyperdensity along the left cerebellar tentorium consistent with small subdural hematoma. 2 mm left parafalcine subdural hematoma with apparent trace left mesial frontal subarachnoid blood, axial 21/32. 4 mm right temporal subdural hematoma, axial 15/32. Basal cisterns are patent. Moderate calcific atherosclerosis of the carotid siphons.  Apparent small right temporal scalp hematoma versus redundant skin. No skull fracture. Visualized paranasal sinuses and mastoid aircells are well-aerated. The included ocular globes and orbital contents are non-suspicious. Status post bilateral ocular lens implants. Severe temporal mandibular osteoarthrosis.  Small left cerebellar tentorium, left parafalcine and right temporal subdural hematomas. Possible trace left mesial frontal subarachnoid hemorrhage. No skull fracture.  No intraparenchymal hemorrhage. Involutional changes. Moderate white matter changes suggest chronic small vessel ischemic disease.  Electronically Signed: By: Elon Alas On: 09/03/2013 03:09    Scheduled Meds: . feeding supplement (ENSURE COMPLETE)  237 mL Oral BID BM  . methylPREDNISolone (SOLU-MEDROL) injection  30 mg Intravenous Q24H  . senna  1 tablet Oral BID   Continuous Infusions:   Principal Problem:   SDH (subdural hematoma) Active Problems:   Hip fracture   Myelodysplastic syndrome, unspecified   Chronic ITP (idiopathic thrombocytopenia)   Acute respiratory failure with hypoxia   Aortic heart murmur   Aortic stenosis, severe    Time spent:  20 minutes    Annita Brod  [facility] Pager [phone] If 7PM-7AM, please contact night-coverage at [email or web address], password [facility] 09/04/2013, 11:43 AM  LOS: 2 days

## 2013-09-04 NOTE — Progress Notes (Signed)
Patient ID: Ashley Lutz, female   DOB: 1919/04/18, 80 y.o.   MRN: [id number]   LOS: 2 days   Subjective: C/o RLE pain, no new c/o. Denies N/V/HA.   Objective: Vital signs in last 24 hours: Temp:  [98.2 F (36.8 C)-98.6 F (37 C)] 98.5 F (36.9 C) (04/10 0805) Pulse Rate:  [66-88] 86 (04/10 0805) Resp:  [13-23] 23 (04/10 0805) BP: (117-149)/(61-83) 149/71 mmHg (04/10 0805) SpO2:  [92 %-99 %] 92 % (04/10 0805) Weight:  [110 lb 0.2 oz (49.9 kg)] 110 lb 0.2 oz (49.9 kg) (04/10 0415) Last BM Date: 09/01/13   Radiology Results CT HEAD WITHOUT CONTRAST  TECHNIQUE:  Contiguous axial images were obtained from the base of the skull  through the vertex without intravenous contrast.  COMPARISON: Prior CT from 09/03/2013  FINDINGS:  Study is somewhat degraded by motion artifact.  Layering hyperdensity along the left tentorium and posterior falx  again seen, compatible with acute subdural hematoma. Overall  appearance is unchanged. Focal hyperdensity along the anterior falx  is also stable. Again, there may be a small amount of associated  subarachnoid hemorrhage within this region (series 2, image 20). No  significant mass effect. Thin subdural overlying the right temporal  lobe measures 3 mm in maximal diameter, unchanged. Subdural  hemorrhage measuring up to 5 mm seen overlying the left occipital  lobe (series 2, image 16), also stable. No new intracranial  hemorrhage. Ventricles are stable in size without evidence of  hydrocephalus. No intraventricular hemorrhage.  No mass lesion or midline shift. No acute large vessel territory  infarct. Mild atrophy with chronic microvascular ischemic disease  again noted.  Calvarium remains intact. Bilateral ocular lens implants noted.  Orbits are otherwise unremarkable. Paranasal sinuses are clear.  Partial opacification of the right mastoid air cells is stable from  prior. Trace opacity noted within the left mastoid air cells as  well.   Scalp soft tissues within normal limits.  IMPRESSION:  1. Stable subdural hemorrhage along the left tentorium and falx as  above. Again, there may be a small amount of associated subarachnoid  hemorrhage within the mesial left frontal lobe adjacent to the  anterior falx. No significant mass effect.  2. Thin subdurals overlying the right temporal and left occipital  lobes, stable. No significant mass effect.  3. No new intracranial hemorrhage or hydrocephalus.  Electronically Signed  By: Jeannine Boga M.D.  On: 09/04/2013 06:03   Physical Exam General appearance: alert and no distress Resp: clear to auscultation bilaterally Cardio: regular rate and rhythm, loud murmur GI: normal findings: bowel sounds normal and soft, non-tender Pulses: 2+ and symmetric Neuro: A&Ox2 (2595)   Assessment/Plan: Fall TBI w/SDH -- HCT stable, MS stable Right hip fx -- for ORIF this afternoon Multiple medical problems -- per IM and hem/onc  No e/o occult injury. Trauma will sign off. Please call with questions.    Lisette Abu, PA-C Pager: [phone] General Trauma PA Pager: [phone]  09/04/2013

## 2013-09-04 NOTE — Progress Notes (Signed)
IP PROGRESS NOTE  Subjective:   Ashley Lutz is well-known to me with a history of thrombocytopenia.  She was admitted 09/03/2013 after a fall. She was diagnosed with a hip fracture and a subdural hemorrhage.  She is scheduled to undergo repair of the hip fracture later today.  Ashley Lutz easy bruising. No other bleeding.  Objective: Vital signs in last 24 hours: Blood pressure 149/71, pulse 86, temperature 98.5 F (36.9 C), temperature source Oral, resp. rate 23, height 5\' 6"  (1.676 m), weight 110 lb 0.2 oz (49.9 kg), SpO2 92.00%.  Intake/Output from previous day: 04/09 0701 - 04/10 0700 In: 470 [P.O.:420; IV Piggyback:50] Out: 3075 [Urine:3075]  Physical Exam:  HEENT: Oral cavity without bleeding Lungs: Clear anteriorly Cardiac: Regular rate and rhythm, 2/6 systolic murmur Abdomen: No hepatosplenomegaly Extremities: No leg edema Skin: Scattered ecchymoses over the extremities Neurologic: Alert and oriented, follows commands, the motor exam appears grossly intact in the upper and lower extremities  Lab Results:  Recent Labs  09/02/13 2355 09/03/13 0027 09/03/13 1213  WBC 8.3  --  12.4*  HGB 10.6* 11.6* 10.4*  HCT 32.6* 34.0* 32.0*  PLT 21*  --  59*    BMET  Recent Labs  09/03/13 0027 09/03/13 1213  NA 141 139  K 3.8 3.4*  CL 104 97  CO2  --  27  GLUCOSE 116* 141*  BUN 18 14  CREATININE 0.80 0.60  CALCIUM  --  8.9    Studies/Results: Dg Chest 1 View  09/03/2013   CLINICAL DATA:  Fall.  EXAM: CHEST - 1 VIEW  COMPARISON:  12/01/2012  FINDINGS: Lungs are adequately inflated with mild prominence of the perihilar markings likely mild vascular congestion. Borderline cardiomegaly. There is calcified plaque over the thoracic aorta. There are degenerate changes spinal bowel curvature of the thoracic spine convex to the right.  IMPRESSION: Borderline cardiomegaly and suggestion of mild vascular congestion.   Electronically Signed   By: Marin Olp  M.D.   On: 09/03/2013 01:00   Dg Hip Complete Right  09/03/2013   CLINICAL DATA:  Fall.  EXAM: RIGHT HIP - COMPLETE 2+ VIEW  COMPARISON:  12/01/2012  FINDINGS: There is diffuse decreased bone mineralization. There are symmetric mild-to-moderate degenerative changes of the hips. There is a mildly displaced subcapital fracture of the right femoral neck. There are degenerative changes of the spine. Calcified plaque is present over the iliac and femoral arteries.  IMPRESSION: Displaced subcapital fracture of the right femoral neck.   Electronically Signed   By: Marin Olp M.D.   On: 09/03/2013 00:58   Ct Head Wo Contrast  09/04/2013   CLINICAL DATA:  subdural hematoma with subarachnoid hemorrhage  EXAM: CT HEAD WITHOUT CONTRAST  TECHNIQUE: Contiguous axial images were obtained from the base of the skull through the vertex without intravenous contrast.  COMPARISON:  Prior CT from 09/03/2013  FINDINGS: Study is somewhat degraded by motion artifact.  Layering hyperdensity along the left tentorium and posterior falx again seen, compatible with acute subdural hematoma. Overall appearance is unchanged. Focal hyperdensity along the anterior falx is also stable. Again, there may be a small amount of associated subarachnoid hemorrhage within this region (series 2, image 20). No significant mass effect. Thin subdural overlying the right temporal lobe measures 3 mm in maximal diameter, unchanged. Subdural hemorrhage measuring up to 5 mm seen overlying the left occipital lobe (series 2, image 16), also stable. No new intracranial hemorrhage. Ventricles are stable in size without evidence of  hydrocephalus. No intraventricular hemorrhage.  No mass lesion or midline shift. No acute large vessel territory infarct. Mild atrophy with chronic microvascular ischemic disease again noted.  Calvarium remains intact. Bilateral ocular lens implants noted. Orbits are otherwise unremarkable. Paranasal sinuses are clear. Partial  opacification of the right mastoid air cells is stable from prior. Trace opacity noted within the left mastoid air cells as well.  Scalp soft tissues within normal limits.  IMPRESSION: 1. Stable subdural hemorrhage along the left tentorium and falx as above. Again, there may be a small amount of associated subarachnoid hemorrhage within the mesial left frontal lobe adjacent to the anterior falx. No significant mass effect. 2. Thin subdurals overlying the right temporal and left occipital lobes, stable. No significant mass effect. 3. No new intracranial hemorrhage or hydrocephalus.   Electronically Signed   By: Jeannine Boga M.D.   On: 09/04/2013 06:03   Ct Head Wo Contrast  09/03/2013   ADDENDUM REPORT: 09/03/2013 03:14  ADDENDUM: Critical Value/emergent results were called by telephone at the time of interpretation on 09/03/2013 at 3:13 AM to [facility] , who verbally acknowledged these results.   Electronically Signed   By: Elon Alas   On: 09/03/2013 03:14   09/03/2013   CLINICAL DATA:  Fall.  EXAM: CT HEAD WITHOUT CONTRAST  TECHNIQUE: Contiguous axial images were obtained from the base of the skull through the vertex without intravenous contrast.  COMPARISON:  None available for comparison at time of study interpretation.  IMPRESSION: The ventricles and sulci are normal for age. No intraparenchymal hemorrhage, mass effect nor midline shift. Patchy to confluent supratentorial white matter hypodensities are within normal range for patient's age and though non-specific suggest sequelae of chronic small vessel ischemic disease. No acute large vascular territory infarcts.  Mild hyperdensity along the left cerebellar tentorium consistent with small subdural hematoma. 2 mm left parafalcine subdural hematoma with apparent trace left mesial frontal subarachnoid blood, axial 21/32. 4 mm right temporal subdural hematoma, axial 15/32. Basal cisterns are patent. Moderate calcific atherosclerosis of the carotid  siphons.  Apparent small right temporal scalp hematoma versus redundant skin. No skull fracture. Visualized paranasal sinuses and mastoid aircells are well-aerated. The included ocular globes and orbital contents are non-suspicious. Status post bilateral ocular lens implants. Severe temporal mandibular osteoarthrosis.  Small left cerebellar tentorium, left parafalcine and right temporal subdural hematomas. Possible trace left mesial frontal subarachnoid hemorrhage. No skull fracture.  No intraparenchymal hemorrhage. Involutional changes. Moderate white matter changes suggest chronic small vessel ischemic disease.  Electronically Signed: By: Elon Alas On: 09/03/2013 03:09    Medications: I have reviewed the patient's current medications.  Assessment/Plan:  1. Anemia/thrombocytopenia-most likely secondary to myelodysplasia. The platelets did not improve with prednisone therapy in 2014.  2. Status post a fall with a right hip fracture  3. aortic stenosis  4. Subdural hemorrhage-stable on followup head CT 09/04/2013, asymptomatic   The chronic thrombocytopenia is most likely secondary to myelodysplasia as opposed to ITP. The platelet count improved following a  transfusion 09/03/2013.  Recommendations: 1. Check platelet count today and transfuse platelets as needed for surgery 2. transfuse platelets for progression of the subdural hemorrhage or as recommended by neurosurgery 3. Okay to discontinue Solu-Medrol  Discussed with Dr. Maryland Pink.  Please call hematology as needed. I will be out 09/04/2013 through 09/06/2013. I will check on her 09/07/2013.  LOS: 2 days   Ashley Lutz  09/04/2013, 8:37 AM

## 2013-09-04 NOTE — Anesthesia Procedure Notes (Signed)
Procedure Name: Intubation Date/Time: 09/04/2013 3:19 PM Performed by: Izora Gala Pre-anesthesia Checklist: Patient identified, Emergency Drugs available, Suction available and Patient being monitored Patient Re-evaluated:Patient Re-evaluated prior to inductionOxygen Delivery Method: Circle system utilized Preoxygenation: Pre-oxygenation with 100% oxygen Intubation Type: IV induction Ventilation: Mask ventilation without difficulty Laryngoscope Size: Miller and 3 Grade View: Grade I Tube type: Oral Tube size: 7.0 mm Number of attempts: 1 Airway Equipment and Method: Stylet Placement Confirmation: ETT inserted through vocal cords under direct vision,  positive ETCO2 and breath sounds checked- equal and bilateral Secured at: 20 cm Tube secured with: Tape Dental Injury: Teeth and Oropharynx as per pre-operative assessment

## 2013-09-04 NOTE — Progress Notes (Signed)
Spoke with Dr. Roberts Gaudy about platelets being brought from blood bank.  Order obtained to begin transfusion and Dr. Linna Caprice stated he would be here in few minutes to speak with patient and her son.

## 2013-09-04 NOTE — Progress Notes (Addendum)
Subjective:  No chest pain of dyspnea. No orthopnea. Appreciate Dr. Antionette Char input.  Objective:  Vital Signs in the last 24 hours: Temp:  [98.2 F (36.8 C)-98.6 F (37 C)] 98.5 F (36.9 C) (04/10 0805) Pulse Rate:  [66-88] 86 (04/10 0805) Resp:  [13-23] 23 (04/10 0805) BP: (117-149)/(61-83) 149/71 mmHg (04/10 0805) SpO2:  [92 %-99 %] 92 % (04/10 0805) Weight:  [49.9 kg (110 lb 0.2 oz)] 49.9 kg (110 lb 0.2 oz) (04/10 0415)  Intake/Output from previous day: 04/09 0701 - 04/10 0700 In: 470 [P.O.:420; IV Piggyback:50] Out: 3075 [Urine:3075]  Physical Exam:   General appearance: alert, cooperative, appears stated age and no distress  Lungs: clear to auscultation bilaterally  Chest wall: no tenderness  Heart: S1 is normal, S2 is muffled. There is a 3/6 ejection systolic murmur heard in the apex and also right sternal border, conducted to the carotids. No gallop appreciated.  Abdomen: soft, non-tender; bowel sounds normal; no masses, no organomegaly  Lab Results: BMP  Recent Labs  12/01/12 0355 09/03/13 0027 09/03/13 1213  NA 136 141 139  K 3.2* 3.8 3.4*  CL 100 104 97  CO2 25  --  27  GLUCOSE 91 116* 141*  BUN 20 18 14   CREATININE 0.66 0.80 0.60  CALCIUM 8.7  --  8.9  GFRNONAA 74*  --  76*  GFRAA 86*  --  88*    CBC  Recent Labs Lab 09/03/13 1213  WBC 12.4*  RBC 3.47*  HGB 10.4*  HCT 32.0*  PLT 59*  MCV 92.2  MCH 30.0  MCHC 32.5  RDW 17.4*  LYMPHSABS 1.0  MONOABS 1.1*  EOSABS 0.0  BASOSABS 0.0    HEMOGLOBIN A1C No results found for this basename: HGBA1C, MPG    Cardiac Panel (last 3 results) No results found for this basename: CKTOTAL, CKMB, TROPONINI, RELINDX,  in the last 8760 hours  BNP (last 3 results) No results found for this basename: PROBNP,  in the last 8760 hours  TSH No results found for this basename: TSH,  in the last 8760 hours  CHOLESTEROL No results found for this basename: CHOL,  in the last 8760 hours  Hepatic Function  Panel  Recent Labs  12/01/12 0355 09/03/13 1213  PROT 7.5 8.0  ALBUMIN 3.5 3.7  AST 15 27  ALT 12 12  ALKPHOS 54 63  BILITOT 0.8 0.5    Cardiac Studies:  EKG 09/03/2013: Normal sinus rhythm, possible left atrial abnormality, leftward axis, no evidence of ischemia. Otherwise normal EKG.  Assessment/Plan:  1. Severe aortic stenosis with a aortic valve area of 0.74 cm, mean gradient of 69 mm mercury. Echocardiogram performed on 09/03/2013 revealing normal left systolic function.  2. Mild mitral stenosis with a mean gradient of 7 mm mercury.  3. Myelodysplastic syndrome, chronic thrombus cytopenia  4. Right hip fracture, small subdural hematoma secondary to fall. No history of syncope  Rec: Antihypertensives on hold. Avoid diuretics. Maintain SBP > = to 110 to 120 mm Hg. Low threshold for blood transfusion, will follow post op. Need TCU or ICU bed post procedure.   Laverda Page, M.D. 09/04/2013, 9:57 AM [facility] Pager: [phone] Office: [phone] If no answer: [phone]   09/05/2013: Patient has not been seen by me, however has been doing well and has been sitting up on the side of the bed on a chair.  Arterial line blood pressures have been stable around 696-295 mmHg systolic.  Cuff pressures have been 90  mmHg.  Hemoglobin has tended and is down by 2 g since admission.  I have discussed with the in charge nurse and patient will remain in the ICU for at least until tomorrow morning.  I will order a hemoglobin/hematocrit and if less than 8 g, would transfuse her.  Also started her on D5 0.45 at 75 cc an hour for 12 hours.  Follow-up on CBC in the morning along with the BMP.

## 2013-09-04 NOTE — Clinical Documentation Improvement (Signed)
  Per eval by Registered Dietician:"Patient meets criteria for severe malnutrition in the context of chronic illnes as evidenced by severe muscle loss and severe subcutaneous fat loss" with BMI 18.05 and 85% or body weight. Please address in Notes and DC summary. Thank you.  Possible Clinical conditions - Severe protein calorie malnutrition     - Underweight w/BMI - Other condition  Subcutaneous Fat:  - Upper Arm Region: severe depletion   Muscle:  - Temple Region: mild to moderate depletion  - Clavicle Bone Region: severe depletion  - Clavicle and Acromion Bone Region: severe depletion   Thank You, Ezekiel Ina ,RN Clinical Documentation Specialist:  [phone]  [facility] [employer]

## 2013-09-04 NOTE — Transfer of Care (Signed)
Immediate Anesthesia Transfer of Care Note  Patient: Ashley Lutz  Procedure(s) Performed: Procedure(s): ARTHROPLASTY BIPOLAR HIP (Right)  Patient Location: PACU  Anesthesia Type:General  Level of Consciousness: awake, patient cooperative and confused  Airway & Oxygen Therapy: Patient Spontanous Breathing and Patient connected to face mask oxygen  Post-op Assessment: Report given to PACU RN, Post -op Vital signs reviewed and stable and Patient moving all extremities  Post vital signs: Reviewed and stable  Complications: No apparent anesthesia complications

## 2013-09-04 NOTE — Progress Notes (Signed)
Report given to Herbert Deaner, RN, CRNA. Aware that blood products were just started. Same in on pump.

## 2013-09-04 NOTE — Discharge Instructions (Signed)
Diet: As you were doing prior to hospitalization  ° °Shower:  May shower but keep the wounds dry, use an occlusive plastic wrap, NO SOAKING IN TUB.  If the bandage gets wet, change with a clean dry gauze. ° °Dressing:  You may change your dressing 3-5 days after surgery.  Then change the dressing daily with sterile gauze dressing.   ° °There are sticky tapes (steri-strips) on your wounds and all the stitches are absorbable.  Leave the steri-strips in place when changing your dressings, they will peel off with time, usually 2-3 weeks. ° °Activity:  Increase activity slowly as tolerated, but follow the weight bearing instructions below.  No lifting or driving for 6 weeks. ° °Weight Bearing:   As tolerated.   ° °To prevent constipation: you may use a stool softener such as - ° °Colace (over the counter) 100 mg by mouth twice a day  °Drink plenty of fluids (prune juice may be helpful) and high fiber foods °Miralax (over the counter) for constipation as needed.   ° °Itching:  If you experience itching with your medications, try taking only a single pain pill, or even half a pain pill at a time.  You may take up to 10 pain pills per day, and you can also use benadryl over the counter for itching or also to help with sleep.  ° °Precautions:  If you experience chest pain or shortness of breath - call 911 immediately for transfer to the hospital emergency department!! ° °If you develop a fever greater that 101 F, purulent drainage from wound, increased redness or drainage from wound, or calf pain -- Call the office at [phone]                                                °Follow- Up Appointment:  Please call for an appointment to be seen in 2 weeks [place] - [phone] ° ° ° ° ° °

## 2013-09-04 NOTE — Progress Notes (Signed)
I have seen and examined the pt and agree with PA-Jeffery's progress note.

## 2013-09-04 NOTE — Op Note (Signed)
09/02/2013 - 09/04/2013  4:45 PM  PATIENT:  Ashley Lutz   MRN: [id number]  PRE-OPERATIVE DIAGNOSIS:  Right hip fracture mid cervical femoral neck  POST-OPERATIVE DIAGNOSIS:  Right hip fracture midcervical femoral neck  PROCEDURE:  Procedure(s): ARTHROPLASTY BIPOLAR HIP  PREOPERATIVE INDICATIONS:  Ashley Lutz is an 80 y.o. female who was admitted 09/02/2013 with a diagnosis of Fracture of femoral neck, right and elected for surgical management.  The risks benefits and alternatives were discussed with the patient including but not limited to the risks of nonoperative treatment, versus surgical intervention including infection, bleeding, nerve injury, periprosthetic fracture, the need for revision surgery, dislocation, leg length discrepancy, blood clots, cardiopulmonary complications, morbidity, mortality, among others, and they were willing to proceed.  Predicted outcome is guarded, although there will be at least a six to nine month expected recovery. Should multiple complicated medical problems including severe aortic stenosis, intracranial bleed, acute respiratory failure, among others.  OPERATIVE REPORT     SURGEON:  [name], MD    ASSISTANT:  Joya Gaskins, OPA-C  (Present throughout the entire procedure,  necessary for completion of procedure in a timely manner, assisting with retraction, instrumentation, and closure)     ANESTHESIA:  General    COMPLICATIONS:  None.    Estimated blood loss: 800 cc  Blood given: 2 units, and process currently.  COMPONENTS:  Depuy [place] Basic press-fit Femoral Fracture stem size 3, with a -3 spacer and a size 45 fracture head unipolar hip ball.    PROCEDURE IN DETAIL: The patient was met in the holding area and identified.  The appropriate hip  was marked at the operative site. The patient was then transported to the OR and  placed under general anesthesia.  At that point, the patient was  placed in the lateral decubitus position  with the operative side up and  secured to the operating room table and all bony prominences padded.     The operative lower extremity was prepped from the iliac crest to the toes.  Sterile draping was performed.  Time out was performed prior to incision.      A routine posterolateral approach was utilized via sharp dissection  carried down to the subcutaneous tissue.  Gross bleeders were Bovie  coagulated.  The iliotibial band was identified and incised  along the length of the skin incision.  Self-retaining retractors were  inserted.  With the hip internally rotated, the short external rotators  were identified. The piriformis was tagged with FiberWire, and the hip capsule released in a T-type fashion.  The femoral neck was exposed, and I resected the femoral neck using the appropriate jig. This was performed at approximately a thumb's breadth above the lesser trochanter.    I then exposed the deep acetabulum, cleared out any tissue including the ligamentum teres, and included the hip capsule in the FiberWire used above and below the T.    I then prepared the proximal femur using the cookie-cutter, the lateralizing reamer, and then sequentially broached.  A trial utilized, and I reduced the hip and it was found to have excellent stability with functional range of motion. I elected to do a press-fit type technique in order to minimize operative time, for her aortic stenosis, and also to minimize risk for reaction to the cement, which can include mortality. The trial components were then removed. During the removal of the head ball, it actually slipped posteriorly, and I had to retrieve it from the posterior gluteal musculature.  I took care to protect the sciatic nerve, and confirmed its integrity at the completion.  I then placed a cement restrictor, and cemented the real components in place. All excess cement was removed. Once the cement had cured, I impacted the real head ball into place. The hip  was then reduced and taken through functional range of motion and found to have excellent stability. Leg lengths were restored.  I then used a 2 mm drill bits to pass the FiberWire suture from the capsule and puriform is through the greater trochanter, and secured this. Excellent posterior capsular repair was achieved. I also closed the T in the capsule.  I then irrigated the hip copiously again with pulse lavage, and repaired the fascia with Vicryl, followed by Vicryl for the subcutaneous tissue, Monocryl for the skin, Steri-Strips and sterile gauze. The wounds were injected. The patient was then awakened and returned to PACU in stable and satisfactory condition. There were no complications.  [name], MD Orthopedic Surgeon [phone]   09/04/2013 4:45 PM

## 2013-09-04 NOTE — Progress Notes (Signed)
PULMONARY / CRITICAL CARE MEDICINE   Name: Ashley Lutz MRN: [id number] DOB: 30-Dec-1918    ADMISSION DATE:  09/02/2013 CONSULTATION DATE:  09/02/13  REFERRING MD :  Dr. Berle Mull PRIMARY SERVICE: [facility]  CHIEF COMPLAINT:  Right hip fracture s/p fall  BRIEF PATIENT DESCRIPTION:  Patient is a 80 year old woman with a PMH of chronic ITP managed by Hematologist Dr. Benay Spice, HTN, chronic idiopathic monocytosis, who presents to the ED after a fall. She was found to have right hip fracture and head CT showed small left cerebellar tentorium, left parafalcine and right temporal subdural hematomas, with possible trace left mesial frontal subarachnoid hemorrhage. She was also noted to have respiratory distress with O2Sat 84%. NRB mask was initiated with O2 Sat up to 96%. Per RN report, prior to transfer from 2W to [facility], she was on 6L [place], which she remains on now, saturating 95-100%.  PCCM is consulted for respiratory failure.   SIGNIFICANT EVENTS / STUDIES:  4/8 Right hip fracture and brain   LINES / TUBES: None  CULTURES: None  ANTIBIOTICS: None  SUBJECTIVE: No events overnight.  VITAL SIGNS: Temp:  [98.2 F (36.8 C)-98.6 F (37 C)] 98.5 F (36.9 C) (04/10 0805) Pulse Rate:  [66-88] 86 (04/10 0805) Resp:  [13-23] 23 (04/10 0805) BP: (117-149)/(61-83) 149/71 mmHg (04/10 0805) SpO2:  [92 %-99 %] 92 % (04/10 0805) Weight:  [110 lb 0.2 oz (49.9 kg)] 110 lb 0.2 oz (49.9 kg) (04/10 0415) HEMODYNAMICS:   VENTILATOR SETTINGS:   INTAKE / OUTPUT: Intake/Output     04/09 0701 - 04/10 0700 04/10 0701 - 04/11 0700   P.O. 420    IV Piggyback 50    Total Intake(mL/kg) 470 (9.4)    Urine (mL/kg/hr) 3075 (2.6) 300 (1.1)   Emesis/NG output     Total Output 3075 300   Net -2605 -300         PHYSICAL EXAMINATION: General:  Pleasant, no acute distress, appears as stated age Neuro:  AAO x 3, appropriate, follows commands, good alternating hand mvmts with some apraxia, no  pronator drift, CN 2-12 grossly intact, strength 5/5 b/l UE & LE, sensation grossly intact HEENT:  PERRL, no scleral icterus, no conjunctival pallor Cardiovascular:  5/6 SEM with carotid radiation, RRR, peripheral pulses intact Lungs:  CTA b/l without wheezing/rales/rhonchi Abdomen:  Soft, nontender, nondistended, normoactive BS Musculoskeletal:  Tenderness over right hip, +b/l pedal edema without calf tenderness Skin:  Petechiae on b/l LE  LABS:  CBC  Recent Labs Lab 09/02/13 2355 09/03/13 0027 09/03/13 1213 09/04/13 1120  WBC 8.3  --  12.4* 15.7*  HGB 10.6* 11.6* 10.4* 10.7*  HCT 32.6* 34.0* 32.0* 33.2*  PLT 21*  --  59* PENDING   Coag's  Recent Labs Lab 09/02/13 2355  INR 1.14   BMET  Recent Labs Lab 09/03/13 0027 09/03/13 1213  NA 141 139  K 3.8 3.4*  CL 104 97  CO2  --  27  BUN 18 14  CREATININE 0.80 0.60  GLUCOSE 116* 141*   Electrolytes  Recent Labs Lab 09/03/13 1213  CALCIUM 8.9   Sepsis Markers No results found for this basename: LATICACIDVEN, PROCALCITON, O2SATVEN,  in the last 168 hours ABG  Recent Labs Lab 09/03/13 0526  PHART 7.454*  PCO2ART 35.4  PO2ART 64.3*   Liver Enzymes  Recent Labs Lab 09/03/13 1213  AST 27  ALT 12  ALKPHOS 63  BILITOT 0.5  ALBUMIN 3.7   Cardiac Enzymes No  results found for this basename: TROPONINI, PROBNP,  in the last 168 hours Glucose No results found for this basename: GLUCAP,  in the last 168 hours  Imaging Dg Chest 1 View  09/03/2013   CLINICAL DATA:  Fall.  EXAM: CHEST - 1 VIEW  COMPARISON:  12/01/2012  FINDINGS: Lungs are adequately inflated with mild prominence of the perihilar markings likely mild vascular congestion. Borderline cardiomegaly. There is calcified plaque over the thoracic aorta. There are degenerate changes spinal bowel curvature of the thoracic spine convex to the right.  IMPRESSION: Borderline cardiomegaly and suggestion of mild vascular congestion.   Electronically Signed    By: Marin Olp M.D.   On: 09/03/2013 01:00   Dg Hip Complete Right  09/03/2013   CLINICAL DATA:  Fall.  EXAM: RIGHT HIP - COMPLETE 2+ VIEW  COMPARISON:  12/01/2012  FINDINGS: There is diffuse decreased bone mineralization. There are symmetric mild-to-moderate degenerative changes of the hips. There is a mildly displaced subcapital fracture of the right femoral neck. There are degenerative changes of the spine. Calcified plaque is present over the iliac and femoral arteries.  IMPRESSION: Displaced subcapital fracture of the right femoral neck.   Electronically Signed   By: Marin Olp M.D.   On: 09/03/2013 00:58   Ct Head Wo Contrast  09/04/2013   CLINICAL DATA:  subdural hematoma with subarachnoid hemorrhage  EXAM: CT HEAD WITHOUT CONTRAST  TECHNIQUE: Contiguous axial images were obtained from the base of the skull through the vertex without intravenous contrast.  COMPARISON:  Prior CT from 09/03/2013  FINDINGS: Study is somewhat degraded by motion artifact.  Layering hyperdensity along the left tentorium and posterior falx again seen, compatible with acute subdural hematoma. Overall appearance is unchanged. Focal hyperdensity along the anterior falx is also stable. Again, there may be a small amount of associated subarachnoid hemorrhage within this region (series 2, image 20). No significant mass effect. Thin subdural overlying the right temporal lobe measures 3 mm in maximal diameter, unchanged. Subdural hemorrhage measuring up to 5 mm seen overlying the left occipital lobe (series 2, image 16), also stable. No new intracranial hemorrhage. Ventricles are stable in size without evidence of hydrocephalus. No intraventricular hemorrhage.  No mass lesion or midline shift. No acute large vessel territory infarct. Mild atrophy with chronic microvascular ischemic disease again noted.  Calvarium remains intact. Bilateral ocular lens implants noted. Orbits are otherwise unremarkable. Paranasal sinuses are clear.  Partial opacification of the right mastoid air cells is stable from prior. Trace opacity noted within the left mastoid air cells as well.  Scalp soft tissues within normal limits.  IMPRESSION: 1. Stable subdural hemorrhage along the left tentorium and falx as above. Again, there may be a small amount of associated subarachnoid hemorrhage within the mesial left frontal lobe adjacent to the anterior falx. No significant mass effect. 2. Thin subdurals overlying the right temporal and left occipital lobes, stable. No significant mass effect. 3. No new intracranial hemorrhage or hydrocephalus.   Electronically Signed   By: Jeannine Boga M.D.   On: 09/04/2013 06:03   Ct Head Wo Contrast  09/03/2013   ADDENDUM REPORT: 09/03/2013 03:14  ADDENDUM: Critical Value/emergent results were called by telephone at the time of interpretation on 09/03/2013 at 3:13 AM to [facility] , who verbally acknowledged these results.   Electronically Signed   By: Elon Alas   On: 09/03/2013 03:14   09/03/2013   CLINICAL DATA:  Fall.  EXAM: CT HEAD WITHOUT CONTRAST  TECHNIQUE: Contiguous axial images were obtained from the base of the skull through the vertex without intravenous contrast.  COMPARISON:  None available for comparison at time of study interpretation.  IMPRESSION: The ventricles and sulci are normal for age. No intraparenchymal hemorrhage, mass effect nor midline shift. Patchy to confluent supratentorial white matter hypodensities are within normal range for patient's age and though non-specific suggest sequelae of chronic small vessel ischemic disease. No acute large vascular territory infarcts.  Mild hyperdensity along the left cerebellar tentorium consistent with small subdural hematoma. 2 mm left parafalcine subdural hematoma with apparent trace left mesial frontal subarachnoid blood, axial 21/32. 4 mm right temporal subdural hematoma, axial 15/32. Basal cisterns are patent. Moderate calcific atherosclerosis of the  carotid siphons.  Apparent small right temporal scalp hematoma versus redundant skin. No skull fracture. Visualized paranasal sinuses and mastoid aircells are well-aerated. The included ocular globes and orbital contents are non-suspicious. Status post bilateral ocular lens implants. Severe temporal mandibular osteoarthrosis.  Small left cerebellar tentorium, left parafalcine and right temporal subdural hematomas. Possible trace left mesial frontal subarachnoid hemorrhage. No skull fracture.  No intraparenchymal hemorrhage. Involutional changes. Moderate white matter changes suggest chronic small vessel ischemic disease.  Electronically Signed: By: Elon Alas On: 09/03/2013 03:09     Admission CXR: b/l mild edema with likely interstitial disease   ASSESSMENT / PLAN:  PULMONARY A: Acute hypoxic respiratory failure with mild pulmonary edema, now saturating 95-100% on 6L [place], concern for cardiogenic etiology, murmur, regurg  P:   - Continuous pulse ox with O2 support - Lasix 40mg  IV per primary - 2D Echo pending  CARDIOVASCULAR A: +SEM, likely MR but possible AS, HTN P:  - Hold home amlodipine/ramipril for now - Lasix as above - Echo as above - CE per primary  RENAL A:  No active issues P:   - Monitor prn  GASTROINTESTINAL A:  No active issues P:   - Dulcolax/Senokot prn  HEMATOLOGIC A:  Chronic ITP with Thrombocytopenia (baseline seems to be ~30-40), Normocytic Anemia (Hb at baseline ~11) P:  - Hematology consulted by primary - Steroids with platelet transfusion as ordered - CBC post transfusion, may be dificult to have a goal plat count as will be short lived half life plat even after Tx - If no significant response, may consider IVIG - SCDs for VTE ppx  INFECTIOUS A:  No active issues P:   - Monitor prn  ENDOCRINE A:  No active issues P:   - Monitor glucose on BMET  NEUROLOGIC A:  Subdural Hematoma with subarachnoid bleed, good mentation without neuro  deficits; given functional platelet status, do not suspect hemorrhage is secondary to ITP, ?possible trauma P:   - Repeat head CT in AM, sooner if clinical status deteriorates (ordered) - Neuro checks q2h -plat Transfusion -echo assessment  MSK A: Displaced subcapital fracture of right femoral neck s/p fall P:  - Ortho consulted - Pain mgmt per primary  GLOBAL -Full Code  TODAY'S SUMMARY: Little else for PCCM to do here, plan as above.  Recommend addressing EOL issues and code status.  PCCM will sign off, please call back if needed.  I have personally obtained a history, examined the patient, evaluated laboratory and imaging results, formulated the assessment and plan and placed orders.  Rush Farmer, M.D. [facility] Pulmonary/Critical Care Medicine. Pager: [phone]. After hours pager: [phone].

## 2013-09-04 NOTE — Progress Notes (Signed)
The patient has been re-examined, and the chart reviewed, and there have been no interval changes to the documented history and physical.    The risks, benefits, and alternatives have been discussed at length, and the patient is willing to proceed.    Sachit Gilman P Undray Allman, MD  

## 2013-09-04 NOTE — Anesthesia Preprocedure Evaluation (Signed)
Anesthesia Evaluation  Patient identified by MRN, date of birth, ID band Patient awake    Reviewed: Allergy & Precautions, H&P , NPO status , Patient's Chart, lab work & pertinent test results  History of Anesthesia Complications Negative for: history of anesthetic complications  Airway Mallampati: II TM Distance: >3 FB     Dental  (+) Teeth Intact   Pulmonary shortness of breath and with exertion,   Pre op saturation 89 RA Pulmonary exam normal       Cardiovascular hypertension, Pt. on medications + Valvular Problems/Murmurs AS Rhythm:Regular Rate:Normal + Systolic murmurs    Neuro/Psych negative neurological ROS  negative psych ROS   GI/Hepatic negative GI ROS, Neg liver ROS,   Endo/Other  negative endocrine ROS  Renal/GU negative Renal ROS     Musculoskeletal   Abdominal Normal abdominal exam  (+)   Peds  Hematology  (+) Blood dyscrasia, , ITP  Platelets  51k   Anesthesia Other Findings   Reproductive/Obstetrics                           Anesthesia Physical Anesthesia Plan  ASA: IV  Anesthesia Plan: General   Post-op Pain Management:    Induction: Intravenous  Airway Management Planned: Oral ETT  Additional Equipment: Arterial line  Intra-op Plan:   Post-operative Plan: Extubation in OR and Possible Post-op intubation/ventilation  Informed Consent: I have reviewed the patients History and Physical, chart, labs and discussed the procedure including the risks, benefits and alternatives for the proposed anesthesia with the patient or authorized representative who has indicated his/her understanding and acceptance.   Dental advisory given  Plan Discussed with: CRNA, Anesthesiologist and Surgeon  Anesthesia Plan Comments:         Anesthesia Quick Evaluation

## 2013-09-04 NOTE — Anesthesia Postprocedure Evaluation (Signed)
  Anesthesia Post-op Note  Patient: Ashley Lutz  Procedure(s) Performed: Procedure(s): ARTHROPLASTY BIPOLAR HIP (Right)  Patient Location: PACU  Anesthesia Type:General  Level of Consciousness: awake and sedated  Airway and Oxygen Therapy: Patient Spontanous Breathing and Patient connected to nasal cannula oxygen  Post-op Pain: mild  Post-op Assessment: Post-op Vital signs reviewed, Patient's Cardiovascular Status Stable, Respiratory Function Stable, Patent Airway and Pain level controlled  Post-op Vital Signs: stable  Last Vitals:  Filed Vitals:   09/04/13 1815  BP: 124/74  Pulse: 93  Temp: 36.7 C  Resp: 18    Complications: No apparent anesthesia complications

## 2013-09-05 LAB — BASIC METABOLIC PANEL
BUN: 21 mg/dL (ref 6–23)
CHLORIDE: 102 meq/L (ref 96–112)
CO2: 25 mEq/L (ref 19–32)
Calcium: 8.3 mg/dL — ABNORMAL LOW (ref 8.4–10.5)
Creatinine, Ser: 0.6 mg/dL (ref 0.50–1.10)
GFR calc non Af Amer: 76 mL/min — ABNORMAL LOW (ref >90)
GFR, EST AFRICAN AMERICAN: 88 mL/min — AB (ref >90)
Glucose, Bld: 111 mg/dL — ABNORMAL HIGH (ref 70–99)
Potassium: 4.2 mEq/L (ref 3.7–5.3)
SODIUM: 139 meq/L (ref 137–147)

## 2013-09-05 LAB — CBC
HEMATOCRIT: 24.9 % — AB (ref 36.0–46.0)
HEMOGLOBIN: 8.4 g/dL — AB (ref 12.0–15.0)
MCH: 29 pg (ref 26.0–34.0)
MCHC: 33.7 g/dL (ref 30.0–36.0)
MCV: 85.9 fL (ref 78.0–100.0)
Platelets: 54 10*3/uL — ABNORMAL LOW (ref 150–400)
RBC: 2.9 MIL/uL — AB (ref 3.87–5.11)
RDW: 17.3 % — ABNORMAL HIGH (ref 11.5–15.5)
WBC: 18.1 10*3/uL — AB (ref 4.0–10.5)

## 2013-09-05 LAB — HEMOGLOBIN AND HEMATOCRIT, BLOOD
HCT: 27.4 % — ABNORMAL LOW (ref 36.0–46.0)
HEMATOCRIT: 22.9 % — AB (ref 36.0–46.0)
HEMOGLOBIN: 9.6 g/dL — AB (ref 12.0–15.0)
Hemoglobin: 8 g/dL — ABNORMAL LOW (ref 12.0–15.0)

## 2013-09-05 LAB — GLUCOSE, CAPILLARY: Glucose-Capillary: 128 mg/dL — ABNORMAL HIGH (ref 70–99)

## 2013-09-05 LAB — TROPONIN I: Troponin I: <0.3 ng/mL (ref <0.30)

## 2013-09-05 MED ORDER — FUROSEMIDE 10 MG/ML IJ SOLN
20.0000 mg | Freq: Once | INTRAMUSCULAR | Status: AC
  Administered 2013-09-05: 20 mg via INTRAVENOUS
  Filled 2013-09-05: qty 2

## 2013-09-05 MED ORDER — DEXTROSE-NACL 5-0.45 % IV SOLN
INTRAVENOUS | Status: DC
  Administered 2013-09-05 – 2013-09-06 (×2): via INTRAVENOUS

## 2013-09-05 NOTE — Progress Notes (Signed)
[facility] PROGRESS NOTE  Ashley Lutz QMV:[id number] DOB: 1919/04/30 DOA: 09/02/2013 PCP: Mathews Argyle, MD  Assessment/Plan:   SDH (subdural hematoma): Agree with critical care, likely not from thrombocytopenia directly. Likely sustained from fall. Followup CT scan stable. Cleared by neurosurgery. No further workup needed unless mental status changes develop.  Principal problem:   Hip fracture: Status post right hip bipolar arthroplasty. Awaiting followup platelet count. Patient is high risk given aortic stenosis, seems as tolerated surgery    Acute respiratory failure with hypoxia  Severe protein calorie malnutrition: Patient needs criteria in the context of chronic illness  Anemia: Underlying chronic disease but acute blood loss anemia. Hemoglobin currently at 8 and given aortic stenosis and soft her blood pressure, transfusing 1 unit packed red blood cells  Underweight: Patient is 111 lbs w/BMI of 18.1    Aortic stenosis, severe: Noted on echocardiogram.  appreciate cardiology assistance. Blood pressures were maintained during surgery. Post op today, pressure slightly low. Discussed with cardiology and given hemoglobin of 8, transfusing 1 unit packed red blood cells  Thrombocytopenia due to suspected myelodysplastic disorder: Seen by hematology. Patient responded to platelet transfusion. Platelets post surgery stable.  Code Status: Full code  Family Communication: Left messages family  Disposition Plan:  will need skilled nursing after    Consultants:  Critical care-Feinstein  Cardiology-Ganji, Cooper  Hematology-Sherrill  Neurosurgery-Jones  Trauma surgery-Byerly  Orthopedic surgery-Landau  Procedures:  Echocardiogram done 4/9: Severe aortic stenosis  Antibiotics:  None  HPI/Subjective: Patient doing okay other than some soreness after getting back to bed  Objective: Filed Vitals:   09/05/13 1600  BP: 92/40  Pulse: 69  Temp: 97.8 F  (36.6 C)  Resp: 18    Intake/Output Summary (Last 24 hours) at 09/05/13 1613 Last data filed at 09/05/13 1543  Gross per 24 hour  Intake 4225.25 ml  Output   1080 ml  Net 3145.25 ml   Filed Weights   09/03/13 0600 09/04/13 0415  Weight: 50.7 kg (111 lb 12.4 oz) 49.9 kg (110 lb 0.2 oz)    Exam:   General:  Alert and oriented x2, no acute distress  Cardiovascular: Harsh 3/6 holosystolic murmur, regular rate and rhythm  Respiratory: Clear to auscultation bilaterally  Abdomen: Soft, nontender, nondistended, positive bowel sounds  Musculoskeletal: No clubbing or cyanosis or edema   Data Reviewed: Basic Metabolic Panel:  Recent Labs Lab 09/03/13 0027 09/03/13 1213 09/04/13 1120 09/04/13 1639 09/05/13 0440  NA 141 139 138 139 139  K 3.8 3.4* 3.5* 3.1* 4.2  CL 104 97 97  --  102  CO2  --  27 28  --  25  GLUCOSE 116* 141* 140* 146* 111*  BUN 18 14 16   --  21  CREATININE 0.80 0.60 0.55  --  0.60  CALCIUM  --  8.9 9.3  --  8.3*   Liver Function Tests:  Recent Labs Lab 09/03/13 1213  AST 27  ALT 12  ALKPHOS 63  BILITOT 0.5  PROT 8.0  ALBUMIN 3.7   No results found for this basename: LIPASE, AMYLASE,  in the last 168 hours No results found for this basename: AMMONIA,  in the last 168 hours CBC:  Recent Labs Lab 09/02/13 2355  09/03/13 1213 09/04/13 1120 09/04/13 1639 09/04/13 1740 09/05/13 0530 09/05/13 1530  WBC 8.3  --  12.4* 15.7*  --   --  18.1*  --   NEUTROABS  --   --  10.3*  --   --   --   --   --  HGB 10.6*  < > 10.4* 10.7* 7.5* 9.0* 8.4* 8.0*  HCT 32.6*  < > 32.0* 33.2* 22.0* 26.9* 24.9* 22.9*  MCV 92.9  --  92.2 92.7  --   --  85.9  --   PLT 21*  --  59* 51*  --   --  54*  --   < > = values in this interval not displayed. Cardiac Enzymes: No results found for this basename: CKTOTAL, CKMB, CKMBINDEX, TROPONINI,  in the last 168 hours BNP (last 3 results) No results found for this basename: PROBNP,  in the last 8760 hours CBG:  Recent  Labs Lab 09/05/13 0021  GLUCAP 128*    Recent Results (from the past 240 hour(s))  MRSA PCR SCREENING     Status: None   Collection Time    09/03/13  6:19 AM      Result Value Ref Range Status   MRSA by PCR NEGATIVE  NEGATIVE Final   Comment:            The GeneXpert MRSA Assay (FDA     approved for NASAL specimens     only), is one component of a     comprehensive MRSA colonization     surveillance program. It is not     intended to diagnose MRSA     infection nor to guide or     monitor treatment for     MRSA infections.     Studies: Ct Head Wo Contrast  09/04/2013   CLINICAL DATA:  subdural hematoma with subarachnoid hemorrhage  EXAM: CT HEAD WITHOUT CONTRAST  TECHNIQUE: Contiguous axial images were obtained from the base of the skull through the vertex without intravenous contrast.  COMPARISON:  Prior CT from 09/03/2013  FINDINGS: Study is somewhat degraded by motion artifact.  Layering hyperdensity along the left tentorium and posterior falx again seen, compatible with acute subdural hematoma. Overall appearance is unchanged. Focal hyperdensity along the anterior falx is also stable. Again, there may be a small amount of associated subarachnoid hemorrhage within this region (series 2, image 20). No significant mass effect. Thin subdural overlying the right temporal lobe measures 3 mm in maximal diameter, unchanged. Subdural hemorrhage measuring up to 5 mm seen overlying the left occipital lobe (series 2, image 16), also stable. No new intracranial hemorrhage. Ventricles are stable in size without evidence of hydrocephalus. No intraventricular hemorrhage.  No mass lesion or midline shift. No acute large vessel territory infarct. Mild atrophy with chronic microvascular ischemic disease again noted.  Calvarium remains intact. Bilateral ocular lens implants noted. Orbits are otherwise unremarkable. Paranasal sinuses are clear. Partial opacification of the right mastoid air cells is stable  from prior. Trace opacity noted within the left mastoid air cells as well.  Scalp soft tissues within normal limits.  IMPRESSION: 1. Stable subdural hemorrhage along the left tentorium and falx as above. Again, there may be a small amount of associated subarachnoid hemorrhage within the mesial left frontal lobe adjacent to the anterior falx. No significant mass effect. 2. Thin subdurals overlying the right temporal and left occipital lobes, stable. No significant mass effect. 3. No new intracranial hemorrhage or hydrocephalus.   Electronically Signed   By: Jeannine Boga M.D.   On: 09/04/2013 06:03   Dg Pelvis Portable  09/04/2013   CLINICAL DATA:  Postoperative evaluation.  EXAM: PORTABLE PELVIS 1-2 VIEWS; PORTABLE RIGHT HIP - 1 VIEW  COMPARISON:  None available for comparison at time of study interpretation.  FINDINGS:  Status post right hip hemiarthroplasty with intact well-seated femoral component, no fracture deformity. No dislocation. No destructive bony lesions, patient is osteopenic. Moderate to severe vascular calcifications. Phleboliths project in the pelvis. Severe degenerative change of the included lumbar spine. Right hip subcutaneous gas consistent with recent surgery.  IMPRESSION: Status post right hip hemiarthroplasty with expected postop change.   Electronically Signed   By: Elon Alas   On: 09/04/2013 19:49   Dg Hip Portable 1 View Right  09/04/2013   CLINICAL DATA:  Postoperative evaluation.  EXAM: PORTABLE PELVIS 1-2 VIEWS; PORTABLE RIGHT HIP - 1 VIEW  COMPARISON:  None available for comparison at time of study interpretation.  FINDINGS: Status post right hip hemiarthroplasty with intact well-seated femoral component, no fracture deformity. No dislocation. No destructive bony lesions, patient is osteopenic. Moderate to severe vascular calcifications. Phleboliths project in the pelvis. Severe degenerative change of the included lumbar spine. Right hip subcutaneous gas consistent  with recent surgery.  IMPRESSION: Status post right hip hemiarthroplasty with expected postop change.   Electronically Signed   By: Elon Alas   On: 09/04/2013 19:49    Scheduled Meds: . acetaminophen  1,000 mg Intravenous Once  . docusate sodium  100 mg Oral BID  . feeding supplement (ENSURE COMPLETE)  237 mL Oral BID BM  . furosemide  20 mg Intravenous Once  . methylPREDNISolone (SOLU-MEDROL) injection  30 mg Intravenous Q24H  . senna  1 tablet Oral BID   Continuous Infusions: . dextrose 5 % and 0.45% NaCl 75 mL/hr at 09/05/13 1543    Principal Problem:   SDH (subdural hematoma) Active Problems:   Hip fracture   Myelodysplastic syndrome, unspecified   Chronic ITP (idiopathic thrombocytopenia)   Acute respiratory failure with hypoxia   Aortic heart murmur   Aortic stenosis, severe    Time spent: 25 minutes    Ashley Lutz  [facility] Pager [phone] If 7PM-7AM, please contact night-coverage at [email or web address], password [facility] 09/05/2013, 4:13 PM  LOS: 3 days

## 2013-09-05 NOTE — Evaluation (Signed)
Physical Therapy Evaluation Patient Details Name: Ashley Lutz MRN: [id number] DOB: 1919/01/24 Today's Date: 09/05/2013   History of Present Illness  Adm 09/02/13 s/p fall (getting up from chair; denied syncope)  with Rt hip fx. Pt required cardiac clearance prior to surgery. 4/10 Rt hip bipolar prosthesis   Clinical Impression  Pt is s/p Rt hip fx with bipolar prosthesis resulting in the deficits listed below (see PT Problem List). Pt was very independent prior to this fall (her first) and anticipate she will progress well. Pt will benefit from skilled PT to increase their independence and safety with mobility (while adhering to hip precautions) to allow discharge to the venue listed below.        Follow Up Recommendations CIR    Equipment Recommendations   (TBA)    Recommendations for Other Services Rehab consult;OT consult     Precautions / Restrictions Precautions Precautions: Posterior Hip;Fall Precaution Booklet Issued: Yes (comment) Precaution Comments: reviewed with pt, family, and RN Restrictions Weight Bearing Restrictions: Yes RLE Weight Bearing: Weight bearing as tolerated      Mobility  Bed Mobility Overal bed mobility: Needs Assistance;+2 for physical assistance;+ 2 for safety/equipment Bed Mobility: Supine to Sit     Supine to sit: Mod assist;+2 for physical assistance;+2 for safety/equipment;HOB elevated     General bed mobility comments: laterally scooted to Rt side of bed in supine with mod assist; come to sit with assist to raise trunk  Transfers Overall transfer level: Needs assistance Equipment used: Rolling walker (2 wheeled) Transfers: Sit to/from Stand Sit to Stand: Min assist;+2 physical assistance;+2 safety/equipment         General transfer comment: assist with balance upon standing due to posterior lean  Ambulation/Gait Ambulation/Gait assistance: Min assist;+2 physical assistance;+2 safety/equipment Ambulation Distance (Feet): 2  Feet Assistive device: Rolling walker (2 wheeled) Gait Pattern/deviations: Step-to pattern;Decreased stride length;Shuffle;Antalgic;Leaning posteriorly     General Gait Details: posterior lean likely due to heel cord tightness Lt>Rt  Stairs            Wheelchair Mobility    Modified Rankin (Stroke Patients Only)       Balance Overall balance assessment: Needs assistance Sitting-balance support: No upper extremity supported;Feet supported Sitting balance-Leahy Scale: Fair     Standing balance support: Bilateral upper extremity supported Standing balance-Leahy Scale: Poor                               Pertinent Vitals/Pain Denied pain at rest, reported Lt heel cord pain worse than Rt hip pain in standing; pre-medicated for pain prior to session, patient repositioned for comfort BP 107/38 supine       132/55 sitting        142/56 after transfer   Pt denied dizziness           Home Living Family/patient expects to be discharged to:: Inpatient rehab                 Additional Comments: Lived with son PTA, used cane, however very independent with no h/o falls    Prior Function Level of Independence: Independent with assistive device(s)         Comments: cane     Hand Dominance   Dominant Hand: Right    Extremity/Trunk Assessment   Upper Extremity Assessment: Generalized weakness           Lower Extremity Assessment: Generalized weakness;RLE deficits/detail;LLE deficits/detail RLE Deficits /  Details: ankle DF to neutral PROM, reports heel cord tightness is new LLE Deficits / Details: WFL except ankle DF -15, reports tightness is new  Cervical / Trunk Assessment: Normal  Communication   Communication: HOH (has hearing aids)  Cognition Arousal/Alertness: Awake/alert Behavior During Therapy: WFL for tasks assessed/performed Overall Cognitive Status: Within Functional Limits for tasks assessed                      General  Comments      Exercises Total Joint Exercises Ankle Circles/Pumps: AROM;Both;10 reps;Supine      Assessment/Plan    PT Assessment Patient needs continued PT services  PT Diagnosis Difficulty walking;Acute pain   PT Problem List Decreased strength;Decreased range of motion;Decreased activity tolerance;Decreased balance;Decreased mobility;Decreased knowledge of use of DME;Decreased knowledge of precautions;Pain  PT Treatment Interventions DME instruction;Gait training;Stair training;Functional mobility training;Therapeutic activities;Therapeutic exercise;Balance training;Patient/family education   PT Goals (Current goals can be found in the Care Plan section) Acute Rehab PT Goals Patient Stated Goal: go home from here PT Goal Formulation: With patient Time For Goal Achievement: 09/12/13 Potential to Achieve Goals: Good    Frequency Min 5X/week   Barriers to discharge        Co-evaluation               End of Session Equipment Utilized During Treatment: Gait belt;Oxygen Activity Tolerance: Patient tolerated treatment well Patient left: in chair;with call bell/phone within reach;with family/visitor present Nurse Communication: Mobility status;Precautions;Other (comment) (BP incr with activity)         Time: 1325-1400 PT Time Calculation (min): 35 min   Charges:   PT Evaluation $Initial PT Evaluation Tier I: 1 Procedure PT Treatments $Therapeutic Activity: 23-37 mins   PT G CodesJeanie Cooks Dennison Mcdaid 09/15/2013, 2:24 PM Pager [phone]

## 2013-09-05 NOTE — Progress Notes (Signed)
[facility] Progress Note Cross cover   Subjective  Doing well Awake, family in room  States her R hip is a little sore Got good sleep last night No other complaints reported this am    Objective   BP 112/52  Pulse 68  Temp(Src) 97.9 F (36.6 C) (Oral)  Resp 15  Ht 5\' 6"  (1.676 m)  Wt 49.9 kg (110 lb 0.2 oz)  BMI 17.76 kg/m2  SpO2 98%  Intake/Output     04/10 0701 - 04/11 0700 04/11 0701 - 04/12 0700   P.O.     I.V. (mL/kg) 1703.8 (34.1) 75 (1.5)   Blood 984.5    IV Piggyback 600    Total Intake(mL/kg) 3288.3 (65.9) 75 (1.5)   Urine (mL/kg/hr) 820 (0.7) 35 (0.3)   Blood 700 (0.6)    Total Output 1520 35   Net +1768.3 +40          Labs  Results for MEKHI, LASCOLA (MRN [id number]) as of 09/05/2013 09:18  Ref. Range 09/05/2013 04:40 09/05/2013 05:30  Sodium Latest Range: 137-147 mEq/L 139   Potassium Latest Range: 3.7-5.3 mEq/L 4.2   Chloride Latest Range: 96-112 mEq/L 102   CO2 Latest Range: 19-32 mEq/L 25   BUN Latest Range: 6-23 mg/dL 21   Creatinine Latest Range: 0.50-1.10 mg/dL 0.60   Calcium Latest Range: 8.4-10.5 mg/dL 8.3 (L)   GFR calc non Af Amer Latest Range: >90 mL/min 76 (L)   GFR calc Af Amer Latest Range: >90 mL/min 88 (L)   Glucose Latest Range: 70-99 mg/dL 111 (H)   WBC Latest Range: 4.0-10.5 K/uL  18.1 (H)  RBC Latest Range: 3.87-5.11 MIL/uL  2.90 (L)  Hemoglobin Latest Range: 12.0-15.0 g/dL  8.4 (L)  HCT Latest Range: 36.0-46.0 %  24.9 (L)  MCV Latest Range: 78.0-100.0 fL  85.9  MCH Latest Range: 26.0-34.0 pg  29.0  MCHC Latest Range: 30.0-36.0 g/dL  33.7  RDW Latest Range: 11.5-15.5 %  17.3 (H)  Platelets Latest Range: 150-400 K/uL  54 (L)    Exam  Gen: awake and alert, NAD, appear very comfortable  Lungs: unlabored Cardiac: systolic murmur, reg Abd: + BS, NTND Ext:       Right Lower Extremity   Dressing c/d/i  Ext warm  TED hose in place  abd pillow in place  Ext warm  + DP pulse  DPN, SPN, TN sensation  intact  Ext warm  EHL, FHL, lesser toe motor intact  Ankle extension/inv/evr weak, flexion intact    Assessment and Plan   POD/HD#: 1   80 y/o female s/p fall with R hip fx  1. Fall  2. R hip fx s/p R hip hemiarthroplasty  WBAT  Posterior hip precautions  Dressing changes as needed starting tomorrow     Ice prn  TED hose  PT/OT consults  Continue with abduction pillow while in bed   3. ABL anemia  Monitor   Suspect pt will need PRBC  Follow up labs in am   4. Medical issues per primary service   5. DVT/PE prophylaxis  Pt with thrombocytopenia chronically  TED hose in place   6. Dispo  Therapies today  Doing well from ortho standpoint    Jari Pigg, PA-C [facility] [phone] (P) 09/05/2013 9:17 AM

## 2013-09-06 ENCOUNTER — Inpatient Hospital Stay (HOSPITAL_COMMUNITY): Payer: Medicare HMO

## 2013-09-06 DIAGNOSIS — D72829 Elevated white blood cell count, unspecified: Secondary | ICD-10-CM | POA: Diagnosis not present

## 2013-09-06 LAB — TYPE AND SCREEN
ABO/RH(D): O POS
Antibody Screen: NEGATIVE
UNIT DIVISION: 0
Unit division: 0
Unit division: 0
Unit division: 0
Unit division: 0

## 2013-09-06 LAB — URINALYSIS, ROUTINE W REFLEX MICROSCOPIC
Bilirubin Urine: NEGATIVE
GLUCOSE, UA: NEGATIVE mg/dL
KETONES UR: NEGATIVE mg/dL
LEUKOCYTES UA: NEGATIVE
Nitrite: NEGATIVE
Protein, ur: NEGATIVE mg/dL
Specific Gravity, Urine: 1.018 (ref 1.005–1.030)
UROBILINOGEN UA: 0.2 mg/dL (ref 0.0–1.0)
pH: 6 (ref 5.0–8.0)

## 2013-09-06 LAB — CBC
HCT: 25.6 % — ABNORMAL LOW (ref 36.0–46.0)
Hemoglobin: 8.7 g/dL — ABNORMAL LOW (ref 12.0–15.0)
MCH: 29.5 pg (ref 26.0–34.0)
MCHC: 34 g/dL (ref 30.0–36.0)
MCV: 86.8 fL (ref 78.0–100.0)
PLATELETS: 40 10*3/uL — AB (ref 150–400)
RBC: 2.95 MIL/uL — AB (ref 3.87–5.11)
RDW: 16.7 % — ABNORMAL HIGH (ref 11.5–15.5)
WBC: 18.9 10*3/uL — AB (ref 4.0–10.5)

## 2013-09-06 LAB — BASIC METABOLIC PANEL
BUN: 33 mg/dL — ABNORMAL HIGH (ref 6–23)
CALCIUM: 8.4 mg/dL (ref 8.4–10.5)
CO2: 27 meq/L (ref 19–32)
Chloride: 101 mEq/L (ref 96–112)
Creatinine, Ser: 0.69 mg/dL (ref 0.50–1.10)
GFR calc Af Amer: 84 mL/min — ABNORMAL LOW (ref >90)
GFR calc non Af Amer: 73 mL/min — ABNORMAL LOW (ref >90)
Glucose, Bld: 118 mg/dL — ABNORMAL HIGH (ref 70–99)
POTASSIUM: 4.3 meq/L (ref 3.7–5.3)
SODIUM: 138 meq/L (ref 137–147)

## 2013-09-06 LAB — HEMOGLOBIN AND HEMATOCRIT, BLOOD
HEMATOCRIT: 27 % — AB (ref 36.0–46.0)
Hemoglobin: 9 g/dL — ABNORMAL LOW (ref 12.0–15.0)

## 2013-09-06 LAB — URINE MICROSCOPIC-ADD ON

## 2013-09-06 LAB — PREPARE PLATELET PHERESIS: UNIT DIVISION: 0

## 2013-09-06 MED ORDER — PROMETHAZINE HCL 25 MG/ML IJ SOLN
12.5000 mg | Freq: Once | INTRAMUSCULAR | Status: AC
  Administered 2013-09-06: 12.5 mg via INTRAVENOUS
  Filled 2013-09-06: qty 1

## 2013-09-06 MED ORDER — MINERAL OIL RE ENEM
1.0000 | ENEMA | Freq: Once | RECTAL | Status: AC
  Administered 2013-09-06: 1 via RECTAL
  Filled 2013-09-06: qty 1

## 2013-09-06 MED ORDER — FLEET ENEMA 7-19 GM/118ML RE ENEM
1.0000 | ENEMA | Freq: Once | RECTAL | Status: AC
  Administered 2013-09-06: 1 via RECTAL
  Filled 2013-09-06: qty 1

## 2013-09-06 MED ORDER — FUROSEMIDE 10 MG/ML IJ SOLN
20.0000 mg | Freq: Once | INTRAMUSCULAR | Status: AC
  Administered 2013-09-06: 20 mg via INTRAVENOUS
  Filled 2013-09-06: qty 2

## 2013-09-06 MED ORDER — PREDNISONE 20 MG PO TABS
20.0000 mg | ORAL_TABLET | Freq: Every day | ORAL | Status: AC
  Administered 2013-09-07: 20 mg via ORAL
  Filled 2013-09-06: qty 1

## 2013-09-06 MED ORDER — FUROSEMIDE 10 MG/ML IJ SOLN
INTRAMUSCULAR | Status: AC
  Filled 2013-09-06: qty 4

## 2013-09-06 NOTE — Progress Notes (Signed)
[facility]opa[facility]uma [facility] Progress Note Cross Cover   Subjective  Doing very well this am R hip sore but feels much better Worked with therapy yesterday, sat in chair Received 1 unit PRBC's yesterday  Abduction pillow posing as much risk as benefit as currently applied   Objective   BP 127/59  Pulse 82  Temp(Src) 97.4 F (36.3 C) (Oral)  Resp 15  Ht 5\' 6"  (1.676 m)  Wt 49.9 kg (110 lb 0.2 oz)  BMI 17.76 kg/m2  SpO2 95%  Intake/Output     04/11 0701 - 04/12 0700 04/12 0701 - 04/13 0700   P.O. 1440 120   I.V. (mL/kg) 1508.8 (30.2)    Blood 303    IV Piggyback     Total Intake(mL/kg) 3251.8 (65.2) 120 (2.4)   Urine (mL/kg/hr) 1360 (1.1) 190 (1.2)   Blood     Total Output 1360 190   Net +1891.8 -70          Labs  Results for AHYANA, SKILLIN (MRN [id number]) as of 09/06/2013 10:07  Ref. Range 09/06/2013 03:10  WBC Latest Range: 4.0-10.5 K/uL 18.9 (H)  RBC Latest Range: 3.87-5.11 MIL/uL 2.95 (L)  Hemoglobin Latest Range: 12.0-15.0 g/dL 8.7 (L)  HCT Latest Range: 36.0-46.0 % 25.6 (L)  MCV Latest Range: 78.0-100.0 fL 86.8  MCH Latest Range: 26.0-34.0 pg 29.5  MCHC Latest Range: 30.0-36.0 g/dL 34.0  RDW Latest Range: 11.5-15.5 % 16.7 (H)  Platelets Latest Range: 150-400 K/uL 40 (L)  Glucose Latest Range: 70-99 mg/dL 118 (H)    Exam  Gen: awake and alert, comfortable, family in room, very pleasant  Ext:       Right Lower Extremity   Dressing C/D/I, no drainage on mepilex   Ext warm             TED hose in place             Ext warm             + DP pulse             DPN, SPN, TN sensation intact             Ext warm             EHL, FHL, lesser toe motor intact             Ankle extension/inv/evr/flexion intact   Assessment and Plan   POD/HD#: 2    80 y/o female s/p fall with R hip fx  1. Fall  2. R hip fx s/p R hip hemiarthroplasty             WBAT             Posterior hip precautions             Dressing changes as needed       Ice prn             TED hose             PT/OT              Can use regular pillow between legs when in bed to help abduct leg  3. ABL anemia             stable this am  Continue to monitor  Transfused with 1 unit of PRBC's yesterday   Per cards- transfuse if Hgb < 8  4. Medical issues per primary service   5. DVT/PE prophylaxis  Pt with thrombocytopenia chronically             TED hose in place   6. Dispo             Therapies today             Doing well from ortho standpoint    Dicussed with Dr. Einar Gip, would like for pt to remain in SDU for another 24 hours but ok for therapies to work with her.  If stable tomorrow can transfer to a regular floor but would like for her to be on tele.  She could go to ortho floor (5N) as they do have tele capabilities now  Dr. Mardelle Matte to return tomorrow     Jari Pigg, PA-C [facility] [phone] (P) 09/06/2013 10:06 AM  I agree with assessment and plan.  Altamese Turon, MD [facility] [phone] (p)

## 2013-09-06 NOTE — Progress Notes (Signed)
Subjective:  Mild right hip pain, patient sat up on chair yesterday. No dyspnea.  Objective:  Vital Signs in the last 24 hours: Temp:  [97.4 F (36.3 C)-98.5 F (36.9 C)] 97.4 F (36.3 C) (04/12 0700) Pulse Rate:  [60-88] 82 (04/12 0700) Resp:  [14-23] 15 (04/12 0700) BP: (78-127)/(23-83) 127/59 mmHg (04/12 0700) SpO2:  [88 %-100 %] 95 % (04/12 0700) Arterial Line BP: (79-136)/(20-55) 104/49 mmHg (04/12 0300)  Intake/Output from previous day: 04/11 0701 - 04/12 0700 In: 3251.8 [P.O.:1440; I.V.:1508.8; Blood:303] Out: 1360 [Urine:1360]  Physical Exam: General appearance: alert, cooperative, appears stated age and no distress  Lungs: clear to auscultation bilaterally  Chest wall: no tenderness  Heart: S1 is normal, S2 is muffled. There is a 3/6 ejection systolic murmur heard in the apex and also right sternal border, conducted to the carotids. No gallop appreciated.  Abdomen: soft, non-tender; bowel sounds normal; no masses, no organomegaly Right hip site without hematoma or discharge. Surgical site appears good. Pulses normal and intact. Neuro: A Ox3   Lab Results: BMP  Recent Labs  09/04/13 1120 09/04/13 1639 09/05/13 0440 09/06/13 0310  NA 138 139 139 138  K 3.5* 3.1* 4.2 4.3  CL 97  --  102 101  CO2 28  --  25 27  GLUCOSE 140* 146* 111* 118*  BUN 16  --  21 33*  CREATININE 0.55  --  0.60 0.69  CALCIUM 9.3  --  8.3* 8.4  GFRNONAA 78*  --  76* 73*  GFRAA >90  --  88* 84*    CBC  Recent Labs Lab 09/03/13 1213  09/06/13 0310  WBC 12.4*  < > 18.9*  RBC 3.47*  < > 2.95*  HGB 10.4*  < > 8.7*  HCT 32.0*  < > 25.6*  PLT 59*  < > 40*  MCV 92.2  < > 86.8  MCH 30.0  < > 29.5  MCHC 32.5  < > 34.0  RDW 17.4*  < > 16.7*  LYMPHSABS 1.0  --   --   MONOABS 1.1*  --   --   EOSABS 0.0  --   --   BASOSABS 0.0  --   --   < > = values in this interval not displayed.  HEMOGLOBIN A1C No results found for this basename: HGBA1C, MPG    Cardiac Panel (last 3  results)  Recent Labs  09/05/13 1600  TROPONINI <0.30    BNP (last 3 results) No results found for this basename: PROBNP,  in the last 8760 hours  TSH No results found for this basename: TSH,  in the last 8760 hours  CHOLESTEROL No results found for this basename: CHOL,  in the last 8760 hours  Hepatic Function Panel  Recent Labs  12/01/12 0355 09/03/13 1213  PROT 7.5 8.0  ALBUMIN 3.5 3.7  AST 15 27  ALT 12 12  ALKPHOS 54 63  BILITOT 0.8 0.5    Cardiac Studies:  09/03/2013: EKG: Normal sinus rhythm, possible left atrial abnormality, leftward axis, no evidence of ischemia. Otherwise normal EKG.   Assessment/Plan:  1. Severe aortic stenosis with a aortic valve area of 0.74 cm, mean gradient of 69 mm mercury. Echocardiogram performed on 09/03/2013 revealing normal left systolic function.  2. Mild mitral stenosis with a mean gradient of 7 mm mercury.  3. Myelodysplastic syndrome, chronic thrombocytopenia  4. Right hip fracture, small subdural hematoma secondary to fall. S/P right hip arthroplasty and has done well. 5. Blood  loss anemia. 6. Hyperglycemia. We will check HbA1c.  Rec: I have reviewed her labs and also her blood pressure over the last 24 hours. Yesterday and given her unit of packed cells due to decreasing hemoglobin, hemoglobin appears to be stable post transfusion with no significant drop. I will repeat H&H at 3 PM today and would again transfuse her if her hemoglobin drops below 8 only.  Discontinue arterial line, there has been some discrepancy postoperatively between cuff pressure and arterial line  pressure, arterial line pressure has been stable without any hypotension. We will continue to hold off on antihypertensive medications. We will watch her in stepp-down unit for 24 hours, if tomorrow she remains stable, she can be transferred to regular telemetry and continued rehabilitation. From cardiac standpoint no contraindication for rehabilitation team to work  on her today. I have discussed with the family regarding our plans. I also discussed with the RN in charge regarding our plan.  Laverda Page, M.D. 09/06/2013, 9:51 AM [facility] Pager: [phone] Office: [phone] If no answer: [phone]

## 2013-09-06 NOTE — Evaluation (Signed)
Clinical/Bedside Swallow Evaluation Patient Details  Name: Ashley Lutz MRN: [id number] Date of Birth: 1918-11-07  Today's Date: 09/06/2013 Time: 1130-1200 SLP Time Calculation (min): 30 min  Past Medical History:  Past Medical History  Diagnosis Date  . Thrombocytopenia 2010  . Hypertension   . Heart murmur   . Wrist fracture, left   . Chronic idiopathic monocytosis   . Enlargement of lymph nodes     H/O small bilateral axillary lymph nodes  . Shortness of breath     with excertion   Past Surgical History:  Past Surgical History  Procedure Laterality Date  . Hemorrhoid surgery    . Bunionectomy    . Soft tissue cyst excision      X 5 occasions--cysts on scalp   HPI:  Patient is a 80 year old woman with a PMH of chronic ITP managed by Hematologist Dr. Benay Spice, HTN, chronic idiopathic monocytosis, who presents to the ED after a fall. She was found to have right hip fracture and head CT showed small left cerebellar tentorium, left parafalcine and right temporal subdural hematomas, with possible trace left mesial frontal subarachnoid hemorrhage. She was also noted to have respiratory distress with O2Sat 84%. NRB mask was initiated with O2 Sat up to 96%.   BSE indicated due to patient reporting difficulty swallowing.  Patient's family members present during evaluation and report patient with history of difficulty swallowing pills and certain solids.    Assessment / Plan / Recommendation Clinical Impression  BSE completed.  Results indicate +s/s more suspected esophageal based with reports of globus sensation with sips of thin liquid and s/p swallow of solids.  Excessive belching moderately after swallow of liquids.   Delayed throat clears s/p swallow of both liquids and solids indicating possible residuals.   Strategies of alternating bites with sips of warm temperature liquids effective in reducing s/s.  Recommend to continue current diet consistency of regular as tolerated and  thin liquids with aspiration precautions.  S/s present more suspected esophageal based but due to current respiratory status recommend continued Skilled ST in acute care setting for diet tolerance to ensure safety.      Aspiration Risk  Moderate    Diet Recommendation Regular;Thin liquid   Liquid Administration via: Cup;Straw Medication Administration: Whole meds with liquid Supervision: Patient able to self feed;Intermittent supervision to cue for compensatory strategies Compensations: Slow rate;Small sips/bites;Follow solids with liquid Postural Changes and/or Swallow Maneuvers: Upright 30-60 min after meal    Other  Recommendations Oral Care Recommendations: Oral care BID   Follow Up Recommendations  Inpatient Rehab    Frequency and Duration min 1 x/week  2 weeks       SLP Swallow Goals Please refer to Care Plans for listed goals    Swallow Study Prior Functional Status   Lived at home.   Reports of difficulty swallowing pills/solids    General Date of Onset: 09/02/13 HPI: Patient is a 80 year old woman with a PMH of chronic ITP managed by Hematologist Dr. Benay Spice, HTN, chronic idiopathic monocytosis, who presents to the ED after a fall. She was found to have right hip fracture and head CT showed small left cerebellar tentorium, left parafalcine and right temporal subdural hematomas, with possible trace left mesial frontal subarachnoid hemorrhage. She was also noted to have respiratory distress with O2Sat 84%. NRB mask was initiated with O2 Sat up to 96%.  Type of Study: Bedside swallow evaluation Diet Prior to this Study: Regular;Thin liquids Temperature Spikes Noted:  No Respiratory Status: Nasal cannula History of Recent Intubation: No Behavior/Cognition: Alert;Cooperative;Pleasant mood;Requires cueing;Hard of hearing Oral Cavity - Dentition: Dentures, top Self-Feeding Abilities: Able to feed self Patient Positioning: Upright in bed Baseline Vocal Quality:  Clear Volitional Cough: Strong Volitional Swallow: Able to elicit    Oral/Motor/Sensory Function Overall Oral Motor/Sensory Function: Appears within functional limits for tasks assessed   Ice Chips Ice chips: Not tested   Thin Liquid Thin Liquid: Impaired Presentation: Cup;Straw Pharyngeal  Phase Impairments: Throat Clearing - Delayed    Nectar Thick Nectar Thick Liquid: Not tested   Honey Thick Honey Thick Liquid: Not tested   Puree Puree: Not tested   Solid   GO    Solid: Impaired Pharyngeal Phase Impairments: Throat Clearing - Delayed      Sharman Crate [place], CCC-SLP [phone] Chryl Heck Tatumn Corbridge 09/06/2013,12:44 PM

## 2013-09-06 NOTE — Progress Notes (Signed)
Jonette Eva, NP notified of pericardial friction rub on assessment, second RN to verify assessment. Patient in no acute distress. VSS. No new orders received.

## 2013-09-06 NOTE — Progress Notes (Addendum)
[facility] PROGRESS NOTE  Ashley Lutz:[id number] DOB: 1919/05/24 DOA: 09/02/2013 PCP: Mathews Argyle, MD  Interim summary Patient is a 80 year old white female relatively functional with past medical history of hypertension and underlying myelodysplastic disease causing thrombocytopenia who presented after a mechanical fall on 4/8 evening. Patient was found to have a subdural hematoma as well as a right-sided hip fracture. She was admitted to the hospitalist service and placed in the step down unit.  Patient was evaluated by critical care, neurosurgery, hematology and cardiology. An echocardiogram done noted evidence of severe aortic stenosis and patient was felt to be a high risk candidate for surgery. However alternative options would be to go from being mobile to being bed bound with open fracture which was not felt to be amenable for quality of life.  On admission, patient received platelet transfusion and initially on steroids for what was thought to be ITP, however hematology clarified that likely this was underlying myelodysplastic disease and steroids were not needed. Steroids since then had been tapered off and completed on 4/12. Neurosurgery felt that patient subdural was contained and after followup CT the next morning was stable recommended no further workup unless patient's mental status changed.   Patient underwent a right hip bipolar arthroplasty done 4/10. Post procedure patient was placed in the step down unit. She had some expected acute blood loss anemia, hemoglobin as low as 8 and underwent a transfusion of 1 unit packed red blood cells as recommended by cardiology given her heart issues. Since surgery, patient has had a slowly worsening leukocytosis which started at admission and has been trending up and as of 4/13 is a 29.8. Urinalysis negative and chest x-ray negative . Followup troponin on 4/11 negative. Patient initially was feeling well on 4/12, but quite  fatigued and mildly short of breath now. She is also mildly tachycardic.  Addendum: BNP elevated, greater than 3000. Have started IV Lasix. Watch lites closely given aortic stenosis.  Assessment/Plan:   SDH (subdural hematoma): Agree with critical care, likely not from thrombocytopenia directly. Likely sustained from fall. Followup CT scan stable. Cleared by neurosurgery. No further workup needed unless mental status changes develop.  Principal problem:   Hip fracture: Status post right hip bipolar arthroplasty. Awaiting followup platelet count. Patient is high risk given aortic stenosis, seems as tolerated surgery.  Seen by physical therapy who recommended inpatient rehabilitation. Consults made.    Acute respiratory failure with hypoxia: Stabilized  Severe protein calorie malnutrition: Patient needs criteria in the context of chronic illness. On Ensure complete twice a day  Anemia: Underlying chronic disease but acute blood loss anemia. Hemoglobin at 8.0 on 4/11 and given aortic stenosis.  Transfused 1 unit packed red blood cells on 4/11   Underweight: Patient is 111 lbs w/BMI of 18.1    Aortic stenosis, severe: Noted on echocardiogram.  appreciate cardiology assistance. Blood pressures were maintained during surgery. The following day, pressure slightly low. Discussed with cardiology and given hemoglobin of 8, transfusing 1 unit packed red blood cells  Thrombocytopenia due to suspected myelodysplastic disorder: Seen by hematology. Patient responded to platelet transfusion. Platelets post surgery stable.  Need for steroids. This is not ITP-will wean off  Leukocytosis: Continues to worsen since admission. Initially thought to be stress margination. Patient remains afebrile. Have checked troponin, urinalysis and chest x-ray which are unremarkable. Have ordered stat BNP, repeat troponin and lower extremity Dopplers   Code Status: Full code  Family Communication: updated  Family at the  bedside  yesterday  Disposition Plan:  Inpatient rehabilitation to see. Keep in stepdown until patient improves   Consultants:  Critical care-Feinstein-signed off  Cardiology-Ganji, Cooper  Hematology-Sherrill-signed off  Neurosurgery-Jones-signed off  Trauma surgery-Byerly-signed off  Orthopedic surgery-Landau  Procedures:  Echocardiogram done 4/9: Severe aortic stenosis  Status post platelet transfusion done 4/10    status post right bipolar hip arthroplasty  Antibiotics:  None  HPI/Subjective:  patient feeling rough today. Fatigue. With exertion some mild shortness of breath. Mild nonproductive cough.   Objective: Filed Vitals:   09/06/13 0700  BP: 127/59  Pulse: 82  Temp: 97.4 F (36.3 C)  Resp: 15    Intake/Output Summary (Last 24 hours) at 09/06/13 1008 Last data filed at 09/06/13 0900  Gross per 24 hour  Intake 3146.75 ml  Output   1415 ml  Net 1731.75 ml   Filed Weights   09/03/13 0600 09/04/13 0415  Weight: 50.7 kg (111 lb 12.4 oz) 49.9 kg (110 lb 0.2 oz)    Exam:   General:  Alert and oriented x2, fatigue   Cardiovascular: Harsh 3/6 holosystolic murmur, regular rate and rhythm  Respiratory: Clear to auscultation bilaterally, poor respiratory effort   Abdomen: Soft, nontender, nondistended, positive bowel sounds  Musculoskeletal: No clubbing or cyanosis or edema   Data Reviewed: Basic Metabolic Panel:  Recent Labs Lab 09/03/13 0027 09/03/13 1213 09/04/13 1120 09/04/13 1639 09/05/13 0440 09/06/13 0310  NA 141 139 138 139 139 138  K 3.8 3.4* 3.5* 3.1* 4.2 4.3  CL 104 97 97  --  102 101  CO2  --  27 28  --  25 27  GLUCOSE 116* 141* 140* 146* 111* 118*  BUN 18 14 16   --  21 33*  CREATININE 0.80 0.60 0.55  --  0.60 0.69  CALCIUM  --  8.9 9.3  --  8.3* 8.4   Liver Function Tests:  Recent Labs Lab 09/03/13 1213  AST 27  ALT 12  ALKPHOS 63  BILITOT 0.5  PROT 8.0  ALBUMIN 3.7   No results found for this basename:  LIPASE, AMYLASE,  in the last 168 hours No results found for this basename: AMMONIA,  in the last 168 hours CBC:  Recent Labs Lab 09/02/13 2355  09/03/13 1213 09/04/13 1120  09/04/13 1740 09/05/13 0530 09/05/13 1530 09/05/13 2050 09/06/13 0310  WBC 8.3  --  12.4* 15.7*  --   --  18.1*  --   --  18.9*  NEUTROABS  --   --  10.3*  --   --   --   --   --   --   --   HGB 10.6*  < > 10.4* 10.7*  < > 9.0* 8.4* 8.0* 9.6* 8.7*  HCT 32.6*  < > 32.0* 33.2*  < > 26.9* 24.9* 22.9* 27.4* 25.6*  MCV 92.9  --  92.2 92.7  --   --  85.9  --   --  86.8  PLT 21*  --  59* 51*  --   --  54*  --   --  40*  < > = values in this interval not displayed. Cardiac Enzymes:  Recent Labs Lab 09/05/13 1600  TROPONINI <0.30   BNP (last 3 results) No results found for this basename: PROBNP,  in the last 8760 hours CBG:  Recent Labs Lab 09/05/13 0021  GLUCAP 128*    Recent Results (from the past 240 hour(s))  MRSA PCR SCREENING     Status: None  Collection Time    09/03/13  6:19 AM      Result Value Ref Range Status   MRSA by PCR NEGATIVE  NEGATIVE Final   Comment:            The GeneXpert MRSA Assay (FDA     approved for NASAL specimens     only), is one component of a     comprehensive MRSA colonization     surveillance program. It is not     intended to diagnose MRSA     infection nor to guide or     monitor treatment for     MRSA infections.     Studies: Dg Pelvis Portable  09/04/2013   CLINICAL DATA:  Postoperative evaluation.  EXAM: PORTABLE PELVIS 1-2 VIEWS; PORTABLE RIGHT HIP - 1 VIEW  COMPARISON:  None available for comparison at time of study interpretation.  FINDINGS: Status post right hip hemiarthroplasty with intact well-seated femoral component, no fracture deformity. No dislocation. No destructive bony lesions, patient is osteopenic. Moderate to severe vascular calcifications. Phleboliths project in the pelvis. Severe degenerative change of the included lumbar spine. Right hip  subcutaneous gas consistent with recent surgery.  IMPRESSION: Status post right hip hemiarthroplasty with expected postop change.   Electronically Signed   By: Elon Alas   On: 09/04/2013 19:49   Dg Hip Portable 1 View Right  09/04/2013   CLINICAL DATA:  Postoperative evaluation.  EXAM: PORTABLE PELVIS 1-2 VIEWS; PORTABLE RIGHT HIP - 1 VIEW  COMPARISON:  None available for comparison at time of study interpretation.  FINDINGS: Status post right hip hemiarthroplasty with intact well-seated femoral component, no fracture deformity. No dislocation. No destructive bony lesions, patient is osteopenic. Moderate to severe vascular calcifications. Phleboliths project in the pelvis. Severe degenerative change of the included lumbar spine. Right hip subcutaneous gas consistent with recent surgery.  IMPRESSION: Status post right hip hemiarthroplasty with expected postop change.   Electronically Signed   By: Elon Alas   On: 09/04/2013 19:49    Scheduled Meds: . docusate sodium  100 mg Oral BID  . feeding supplement (ENSURE COMPLETE)  237 mL Oral BID BM  . furosemide  20 mg Intravenous Once  . methylPREDNISolone (SOLU-MEDROL) injection  30 mg Intravenous Q24H  . senna  1 tablet Oral BID   Continuous Infusions:    Principal Problem:   SDH (subdural hematoma) Active Problems:   Hip fracture   Myelodysplastic syndrome, unspecified   Chronic ITP (idiopathic thrombocytopenia)   Acute respiratory failure with hypoxia   Aortic heart murmur   Aortic stenosis, severe    Time spent: 35 minutes    Annita Brod  [facility] Pager [phone] If 7PM-7AM, please contact night-coverage at [email or web address], password [facility] 09/06/2013, 10:08 AM  LOS: 4 days

## 2013-09-07 LAB — HEMOGLOBIN A1C
Hgb A1c MFr Bld: 5.5 % (ref <5.7)
Mean Plasma Glucose: 111 mg/dL (ref <117)

## 2013-09-07 LAB — BASIC METABOLIC PANEL
BUN: 31 mg/dL — ABNORMAL HIGH (ref 6–23)
CO2: 25 meq/L (ref 19–32)
CREATININE: 0.55 mg/dL (ref 0.50–1.10)
Calcium: 8.5 mg/dL (ref 8.4–10.5)
Chloride: 99 mEq/L (ref 96–112)
GFR calc Af Amer: >90 mL/min (ref >90)
GFR calc non Af Amer: 78 mL/min — ABNORMAL LOW (ref >90)
Glucose, Bld: 115 mg/dL — ABNORMAL HIGH (ref 70–99)
Potassium: 4.1 mEq/L (ref 3.7–5.3)
SODIUM: 139 meq/L (ref 137–147)

## 2013-09-07 LAB — PRO B NATRIURETIC PEPTIDE: Pro B Natriuretic peptide (BNP): 3126 pg/mL — ABNORMAL HIGH (ref 0–450)

## 2013-09-07 LAB — CBC
HCT: 28.7 % — ABNORMAL LOW (ref 36.0–46.0)
Hemoglobin: 9.4 g/dL — ABNORMAL LOW (ref 12.0–15.0)
MCH: 29 pg (ref 26.0–34.0)
MCHC: 32.8 g/dL (ref 30.0–36.0)
MCV: 88.6 fL (ref 78.0–100.0)
PLATELETS: 49 10*3/uL — AB (ref 150–400)
RBC: 3.24 MIL/uL — AB (ref 3.87–5.11)
RDW: 16.5 % — ABNORMAL HIGH (ref 11.5–15.5)
WBC: 29.8 10*3/uL — AB (ref 4.0–10.5)

## 2013-09-07 LAB — PATHOLOGIST SMEAR REVIEW

## 2013-09-07 LAB — TROPONIN I

## 2013-09-07 MED ORDER — FUROSEMIDE 10 MG/ML IJ SOLN
20.0000 mg | Freq: Once | INTRAMUSCULAR | Status: AC
  Administered 2013-09-07: 20 mg via INTRAVENOUS

## 2013-09-07 MED ORDER — FUROSEMIDE 10 MG/ML IJ SOLN
INTRAMUSCULAR | Status: AC
Start: 2013-09-07 — End: 2013-09-07
  Administered 2013-09-07: 20 mg via INTRAVENOUS
  Filled 2013-09-07: qty 4

## 2013-09-07 MED ORDER — FUROSEMIDE 10 MG/ML IJ SOLN
20.0000 mg | Freq: Two times a day (BID) | INTRAMUSCULAR | Status: DC
  Administered 2013-09-07 – 2013-09-09 (×4): 20 mg via INTRAVENOUS
  Filled 2013-09-07 (×4): qty 2

## 2013-09-07 MED ORDER — FUROSEMIDE 10 MG/ML IJ SOLN
INTRAMUSCULAR | Status: AC
  Filled 2013-09-07: qty 4

## 2013-09-07 NOTE — Progress Notes (Signed)
Physical Therapy Treatment Patient Details Name: ARMELIA PENTON MRN: [id number] DOB: [birth year] Today's Date: 09/07/2013    History of Present Illness Adm 09/02/13 s/p fall (getting up from chair; denied syncope)  with Rt hip fx. Pt required cardiac clearance prior to surgery. 4/10 Rt hip bipolar prosthesis     PT Comments    Pt is progressing towards functional goals. Pt tolerated WB in standing with RW and 2 person A well on this date. Pt was very sleepy and fatigued limiting initial PT participation on this date. Pt's need to use the restroom also limited treatment to not amb on this date. Pt continues to be a good candidate for CIR to maximize independence for safe return home. Acute PT will follow as planned.   Follow Up Recommendations  CIR     Equipment Recommendations  None recommended by PT    Recommendations for Other Services Rehab consult;OT consult     Precautions / Restrictions Precautions Precautions: Posterior Hip;Fall Precaution Booklet Issued: Yes (comment) Precaution Comments: pt able to recall no crossing legs. other precautions reinstructed Restrictions Weight Bearing Restrictions: Yes RLE Weight Bearing: Weight bearing as tolerated    Mobility  Bed Mobility Overal bed mobility: Needs Assistance Bed Mobility: Supine to Sit     Supine to sit: HOB elevated;Max assist     General bed mobility comments: pt req max A for trunk control and Bilat LE movement EOB. pt able to scoot Rt and foward EOB with mod A.   Transfers Overall transfer level: Needs assistance Equipment used: Rolling walker (2 wheeled) Transfers: Sit to/from [facility] Sit to Stand: +2 physical assistance;Mod assist Stand pivot transfers: +2 physical assistance;Min assist       General transfer comment: pt req max verbal cue for hand placement and sequencing. pt req tactile cues to bring L LE closer underneath body to assist with stand transfer. pt req 2 person mod A  to maintain standing position for hygiene cleaning. pt pivoted to [facility] req verbal cues for step sequencing.     Ambulation/Gait             General Gait Details: pt unable to ambulate today due to BM however able to take 5 steps to [facility] with maximal directional verbal cues to sequence steps and manage RW   Stairs            Wheelchair Mobility    Modified Rankin (Stroke Patients Only)       Balance Overall balance assessment: Needs assistance Sitting-balance support: Feet supported;Bilateral upper extremity supported Sitting balance-Leahy Scale: Fair Sitting balance - Comments: pt was very tired and req Bilat UE support to sit EOB for 5 min. pt hed on to RW while sitting on BSC   Standing balance support: Bilateral upper extremity supported Standing balance-Leahy Scale: Poor Standing balance comment: pt req RW for Bilat UE support. pt req 2 person min/mod A to maintain standing balance for hygiene cleaning. pt able to tolerate WB through R LE well with R foot flat on ground. pt able to tolerate 2 trials of standing for 4 minutes each                      Cognition Arousal/Alertness: Awake/alert Behavior During Therapy: WFL for tasks assessed/performed Overall Cognitive Status: Within Functional Limits for tasks assessed       Memory: Decreased recall of precautions              Exercises Total  Joint Exercises Ankle Circles/Pumps: Both;10 reps;AROM;Supine Quad Sets: Strengthening;5 reps;Right;Supine Heel Slides: AAROM;Strengthening;Right;5 reps;Supine    General Comments General comments (skin integrity, edema, etc.): pt req BSC transfer, to people to help with transfer and standing and a third for cleaning. pt in hard of hearing and very tired. pt requests chapstick and cream for bottom after using the restroom.       Pertinent Vitals/Pain Pt report R hip pain during functional mobility activities and LE exercises    Home Living                       Prior Function            PT Goals (current goals can now be found in the care plan section) Acute Rehab PT Goals PT Goal Formulation: With patient Time For Goal Achievement: 09/12/13 Potential to Achieve Goals: Good Progress towards PT goals: Progressing toward goals    Frequency  Min 5X/week    PT Plan Current plan remains appropriate    Co-evaluation             End of Session Equipment Utilized During Treatment: Gait belt Activity Tolerance: Patient limited by fatigue Patient left: in chair;with call bell/phone within reach     Time: 1501-1545 PT Time Calculation (min): 44 min  Charges:  $Therapeutic Exercise: 8-22 mins $Therapeutic Activity: 23-37 mins                    G Codes:      Manley Mason SPT 09/07/2013, 4:49 PM  Agree with above assessment.  Kittie Plater, PT, DPT Pager #: [phone] Office #: [phone]

## 2013-09-07 NOTE — Progress Notes (Signed)
Physical Therapy Treatment Patient Details Name: Ashley Lutz MRN: [id number] DOB: 1919/01/20 Today's Date: 09/07/2013    History of Present Illness Adm 09/02/13 s/p fall (getting up from chair; denied syncope)  with Rt hip fx. Pt required cardiac clearance prior to surgery. 4/10 Rt hip bipolar prosthesis     PT Comments    Pt is progressing towards functional goals. Pt tolerated WB in standing with RW and 2 person A well on this date. Pt was very sleepy and fatigued limiting initial PT participation on this date. Pt's need to use the restroom also limited treatment to not amb on this date. Pt continues to be a good candidate for CIR to maximize independence for safe return home. Acute PT will follow as planned.   Follow Up Recommendations  CIR     Equipment Recommendations  None recommended by PT    Recommendations for Other Services Rehab consult;OT consult     Precautions / Restrictions Precautions Precautions: Posterior Hip;Fall Precaution Booklet Issued: Yes (comment) Precaution Comments: pt able to recall no crossing legs. other precautions reinstructed Restrictions Weight Bearing Restrictions: Yes RLE Weight Bearing: Weight bearing as tolerated    Mobility  Bed Mobility Overal bed mobility: Needs Assistance Bed Mobility: Supine to Sit     Supine to sit: HOB elevated;Max assist     General bed mobility comments: pt req max A for trunk control and Bilat LE movement EOB. pt able to scoot Rt and foward EOB with mod A.   Transfers Overall transfer level: Needs assistance Equipment used: Rolling walker (2 wheeled) Transfers: Sit to/from [facility] Sit to Stand: +2 physical assistance;Mod assist Stand pivot transfers: +2 physical assistance;Min assist       General transfer comment: pt req max verbal cue for hand placement and sequencing. pt req tactile cues to bring L LE closer underneath body to assist with stand transfer. pt req 2 person mod A  to maintain standing position for hygiene cleaning. pt pivoted to [facility] req verbal cues for step sequencing.     Ambulation/Gait                 Stairs            Wheelchair Mobility    Modified Rankin (Stroke Patients Only)       Balance Overall balance assessment: Needs assistance Sitting-balance support: Feet supported;Bilateral upper extremity supported Sitting balance-Leahy Scale: Fair Sitting balance - Comments: pt was very tired and req Bilat UE support to sit EOB for 5 min. pt hed on to RW while sitting on BSC   Standing balance support: Bilateral upper extremity supported Standing balance-Leahy Scale: Poor Standing balance comment: pt req RW for Bilat UE support. pt req 2 person min/mod A to maintain standing balance for hygiene cleaning. pt able to tolerate WB through R LE well with R foot flat on ground. pt able to tolerate 2 trials of standing for 4 minutes each                      Cognition Arousal/Alertness: Awake/alert Behavior During Therapy: WFL for tasks assessed/performed Overall Cognitive Status: Within Functional Limits for tasks assessed       Memory: Decreased recall of precautions              Exercises Total Joint Exercises Ankle Circles/Pumps: Both;10 reps;AROM;Supine Quad Sets: Strengthening;5 reps;Right;Supine Heel Slides: AAROM;Strengthening;Right;5 reps;Supine    General Comments General comments (skin integrity, edema, etc.): pt req BSC  transfer, to people to help with transfer and standing and a third for cleaning. pt in hard of hearing and very tired. pt requests chapstick and cream for bottom after using the restroom.       Pertinent Vitals/Pain Pt report R hip pain during functional mobility activities and LE exercises    Home Living                      Prior Function            PT Goals (current goals can now be found in the care plan section) Acute Rehab PT Goals PT Goal Formulation: With  patient Time For Goal Achievement: 09/12/13 Potential to Achieve Goals: Good Progress towards PT goals: Progressing toward goals    Frequency  Min 5X/week    PT Plan Current plan remains appropriate    Co-evaluation             End of Session Equipment Utilized During Treatment: Gait belt Activity Tolerance: Patient limited by fatigue Patient left: in chair;with call bell/phone within reach     Time: 6579-0383 PT Time Calculation (min): 44 min  Charges:                       G Codes:      Manley Mason SPT 05-Oct-2013, 4:18 PM

## 2013-09-07 NOTE — Progress Notes (Signed)
Rehab Admissions Coordinator Note:  Patient was screened by Retta Diones for appropriateness for an Inpatient Acute Rehab Consult.  At this time, we are recommending Inpatient Rehab consult.  Ashley Lutz 09/07/2013, 9:10 AM  I can be reached at [phone].

## 2013-09-07 NOTE — Progress Notes (Signed)
Speech Language Pathology Treatment: Dysphagia  Patient Details Name: Ashley Lutz MRN: [id number] DOB: 1918-12-23 Today's Date: 09/07/2013 Time: 9450-3888 SLP Time Calculation (min): 23 min  Assessment / Plan / Recommendation Clinical Impression  Pt seen for f/u dysphagia treatment. Pt presents without overt s/s of aspiration, although with limited PO intake per RN. Pt's son reports that she does not like the food that is being delivered to her room, and SLP encouraged pt/son to review menu and/or bring in food items from home as able. Pt consumed limited trials of thin liquids via straw and mixed consistency boluses with regular textures and thin liquids, requiring Min cues for small sips. Pt fatigued quickly, declining further intake.    HPI HPI: Patient is a 80 year old woman with a PMH of chronic ITP managed by Hematologist Dr. Benay Spice, HTN, chronic idiopathic monocytosis, who presents to the ED after a fall. She was found to have right hip fracture and head CT showed small left cerebellar tentorium, left parafalcine and right temporal subdural hematomas, with possible trace left mesial frontal subarachnoid hemorrhage. She was also noted to have respiratory distress with O2Sat 84%. NRB mask was initiated with O2 Sat up to 96%. Bedside swallow evaluation revealed a suspected esophageal component, although SLP f/u was warranted secondary to decreased respiratory support.   Pertinent Vitals N/A  SLP Plan  Continue with current plan of care    Recommendations Diet recommendations: Regular;Thin liquid Liquids provided via: Cup;Straw Medication Administration: Whole meds with liquid Supervision: Patient able to self feed;Intermittent supervision to cue for compensatory strategies Compensations: Slow rate;Small sips/bites;Follow solids with liquid Postural Changes and/or Swallow Maneuvers: Upright 30-60 min after meal;Seated upright 90 degrees              General recommendations: Rehab  consult Oral Care Recommendations: Oral care BID Follow up Recommendations: Inpatient Rehab Plan: Continue with current plan of care    GO      Germain Osgood, M.A. CCC-SLP [phone]  Germain Osgood 09/07/2013, 10:27 AM

## 2013-09-07 NOTE — Progress Notes (Signed)
Patient ID: Ashley Lutz, female   DOB: 09-11-1918, 80 y.o.   MRN: [id number]     Subjective:  Patient reports pain as mild to moderate.  Patient sleeping but did wake and followed commands aware of place and why she is in the hospital.  Objective:   VITALS:   Filed Vitals:   09/06/13 1600 09/06/13 1925 09/06/13 2313 09/07/13 0330  BP:  115/50 142/78 125/61  Pulse:  91 89 94  Temp:  98.4 F (36.9 C) 97.7 F (36.5 C) 98.4 F (36.9 C)  TempSrc:  Oral Oral Oral  Resp: 20 17 24 29   Height:      Weight:      SpO2: 93% 94% 94% 95%    ABD soft Sensation intact distally Dorsiflexion/Plantar flexion intact Incision: dressing C/D/I and no drainage Abduction pillow still in place but not well fitted  Lab Results  Component Value Date   WBC PENDING 09/07/2013   HGB 9.4* 09/07/2013   HCT 28.7* 09/07/2013   MCV 88.6 09/07/2013   PLT PENDING 09/07/2013     Assessment/Plan: 3 Days Post-Op   Principal Problem:   Fracture of femoral neck, right Active Problems:   Thrombocytopenia   SDH (subdural hematoma)   Myelodysplastic syndrome, unspecified   Acute respiratory failure with hypoxia   Aortic heart murmur   Aortic stenosis, severe   Protein-calorie malnutrition, severe   Closed fracture of midcervical section of femur   Leukocytosis, unspecified   Advance diet Up with therapy Continue plan per medicine  DC abduction pillow Posterior hip precautions. WBAT Dry dressing PRN   Joya Gaskins 09/07/2013, 7:28 AM  Discussed and agree with above.  [name], MD Cell [phone]

## 2013-09-07 NOTE — Progress Notes (Signed)
Occupational Therapy Evaluation Patient Details Name: Ashley Lutz MRN: [id number] DOB: 08/29/1918 Today's Date: 09/07/2013    History of Present Illness Adm 09/02/13 s/p fall (getting up from chair; denied syncope)  with Rt hip fx. Pt required cardiac clearance prior to surgery. 4/10 Rt hip bipolar prosthesis    Clinical Impression   PTA, pt lived at home with her son and was mod I with ADL and mobility at can level. Pt with significant functional decline, now requiring max to total A with LB ADL and does not appear cognizant regarding hip precuations. Pt will benefit from skilled OT services to facilitate D/C to next venue due to below deficits.    Follow Up Recommendations  SNF;Supervision/Assistance - 24 hour    Equipment Recommendations  None recommended by OT    Recommendations for Other Services       Precautions / Restrictions Precautions Precautions: Posterior Hip;Fall Precaution Booklet Issued: Yes (comment) Precaution Comments: reviewed with pt, family, and RN Restrictions Weight Bearing Restrictions: Yes RLE Weight Bearing: Weight bearing as tolerated      Mobility Bed Mobility Overal bed mobility: Needs Assistance;+2 for physical assistance;+ 2 for safety/equipment Bed Mobility: Sit to Supine     Supine to sit: +2 for physical assistance;Total assist     General bed mobility comments: total A to lift both legs to bed and lower trunk  Transfers Overall transfer level: Needs assistance Equipment used: Rolling walker (2 wheeled) Transfers: Sit to/from [facility] Sit to Stand: +2 physical assistance;+2 safety/equipment;Max assist Stand pivot transfers: Total assist;+2 physical assistance;+2 safety/equipment       General transfer comment: increased assistance required this pm after pt had been sitting in chair for 2 hours    Balance Overall balance assessment: Needs assistance Sitting-balance support: Feet supported;Bilateral upper  extremity supported Sitting balance-Leahy Scale: Fair Sitting balance - Comments: pt was very tired and req Bilat UE support to sit EOB for 5 min. pt hed on to RW while sitting on BSC   Standing balance support: Bilateral upper extremity supported Standing balance-Leahy Scale: Poor Standing balance comment: pt req RW for Bilat UE support. pt req 2 person min/mod A to maintain standing balance for hygiene cleaning. pt able to tolerate WB through R LE well with R foot flat on ground. pt able to tolerate 2 trials of standing for 4 minutes each                              ADL Overall ADL's : Needs assistance/impaired Eating/Feeding: Set up   Grooming: Set up   Upper Body Bathing: Minimal assitance   Lower Body Bathing: Total assistance;Sit to/from stand   Upper Body Dressing : Minimal assistance   Lower Body Dressing: Total assistance;Sit to/from stand   Toilet Transfer: +2 for safety/equipment;+2 for physical assistance;Total assistance Toilet Transfer Details (indicate cue type and reason): Pt with posterior lean - pushing back during toilet trasnfer Toileting- Clothing Manipulation and Hygiene: Total assistance       Functional mobility during ADLs: +2 for safety/equipment;+2 for physical assistance;Total assistance General ADL Comments: Pt fatigues easeily amnd required +2 assistance     Vision                     Perception     Praxis      Pertinent Vitals/Pain HR 1302 at times BP stable     Hand Dominance Right   Extremity/Trunk  Assessment Upper Extremity Assessment Upper Extremity Assessment: Generalized weakness   Lower Extremity Assessment Lower Extremity Assessment: Generalized weakness;Defer to PT evaluation RLE Deficits / Details: ankle DF to neutral PROM, reports heel cord tightness is new LLE Deficits / Details: WFL except ankle DF -15, reports tightness is new   Cervical / Trunk Assessment Cervical / Trunk Assessment: Normal    Communication Communication Communication: HOH (has hearing aids)   Cognition Arousal/Alertness: Awake/alert Behavior During Therapy: WFL for tasks assessed/performed Overall Cognitive Status: Within Functional Limits for tasks assessed       Memory: Decreased short-term memory             General Comments          Shoulder Instructions      Home Living Family/patient expects to be discharged to:: Inpatient rehab                                 Additional Comments: Lived with son PTA, used cane, however very independent with no h/o falls      Prior Functioning/Environment Level of Independence: Independent with assistive device(s)        Comments: cane    OT Diagnosis: Generalized weakness;Acute pain   OT Problem List: Decreased strength;Decreased activity tolerance;Impaired balance (sitting and/or standing);Decreased range of motion;Decreased safety awareness;Decreased knowledge of use of DME or AE;Decreased knowledge of precautions;Cardiopulmonary status limiting activity;Impaired sensation;Pain;Increased edema   OT Treatment/Interventions: Self-care/ADL training;Therapeutic exercise;Energy conservation;DME and/or AE instruction;Therapeutic activities;Patient/family education;Balance training    OT Goals(Current goals can be found in the care plan section) Acute Rehab OT Goals Patient Stated Goal: go home from here OT Goal Formulation: With patient Time For Goal Achievement: 09/21/13 Potential to Achieve Goals: Good  OT Frequency: Min 2X/week   Barriers to D/C:            Co-evaluation              End of Session Equipment Utilized During Treatment: Gait belt;Rolling walker Nurse Communication: Mobility status;Precautions;Weight bearing status  Activity Tolerance: Patient tolerated treatment well Patient left: in bed;with call bell/phone within reach;with family/visitor present;with nursing/sitter in room   Time: 1700-1735 OT  Time Calculation (min): 35 min Charges:  OT General Charges $OT Visit: 1 Procedure OT Evaluation $Initial OT Evaluation Tier I: 1 Procedure OT Treatments $Self Care/Home Management : 23-37 mins G-Codes:    [name] 2013-09-27, 6:21 PM   [place], OTR/L  [phone] Sep 27, 2013

## 2013-09-08 ENCOUNTER — Encounter (HOSPITAL_COMMUNITY): Payer: Self-pay | Admitting: Orthopedic Surgery

## 2013-09-08 DIAGNOSIS — J9600 Acute respiratory failure, unspecified whether with hypoxia or hypercapnia: Secondary | ICD-10-CM

## 2013-09-08 DIAGNOSIS — I359 Nonrheumatic aortic valve disorder, unspecified: Secondary | ICD-10-CM

## 2013-09-08 LAB — BASIC METABOLIC PANEL
BUN: 24 mg/dL — ABNORMAL HIGH (ref 6–23)
CO2: 31 meq/L (ref 19–32)
CREATININE: 0.6 mg/dL (ref 0.50–1.10)
Calcium: 8.6 mg/dL (ref 8.4–10.5)
Chloride: 96 mEq/L (ref 96–112)
GFR calc Af Amer: 88 mL/min — ABNORMAL LOW (ref >90)
GFR calc non Af Amer: 76 mL/min — ABNORMAL LOW (ref >90)
Glucose, Bld: 104 mg/dL — ABNORMAL HIGH (ref 70–99)
Potassium: 4.3 mEq/L (ref 3.7–5.3)
Sodium: 138 mEq/L (ref 137–147)

## 2013-09-08 LAB — CBC
HCT: 25.8 % — ABNORMAL LOW (ref 36.0–46.0)
Hemoglobin: 8.3 g/dL — ABNORMAL LOW (ref 12.0–15.0)
MCH: 29 pg (ref 26.0–34.0)
MCHC: 32.2 g/dL (ref 30.0–36.0)
MCV: 90.2 fL (ref 78.0–100.0)
Platelets: 39 10*3/uL — ABNORMAL LOW (ref 150–400)
RBC: 2.86 MIL/uL — AB (ref 3.87–5.11)
RDW: 16.4 % — AB (ref 11.5–15.5)
WBC: 19.5 10*3/uL — AB (ref 4.0–10.5)

## 2013-09-08 LAB — PREPARE RBC (CROSSMATCH)

## 2013-09-08 MED ORDER — FUROSEMIDE 10 MG/ML IJ SOLN: 20.0000 mg | Freq: Once | INTRAMUSCULAR | Status: DC

## 2013-09-08 MED ORDER — ENSURE PUDDING PO PUDG
1.0000 | Freq: Two times a day (BID) | ORAL | Status: DC
  Administered 2013-09-09 – 2013-09-10 (×2): 1 via ORAL

## 2013-09-08 NOTE — Progress Notes (Signed)
NUTRITION FOLLOW-UP  DOCUMENTATION CODES Per approved criteria  -Severe malnutrition in the context of chronic illness -Underweight   Patient meets criteria for severe malnutrition in the context of chronic illnes as evidenced by severe muscle loss and severe subcutaneous fat loss.  INTERVENTION: Ensure Complete to Ensure Pudding RD to follow for nutrition care plan  NUTRITION DIAGNOSIS: Increased nutrient needs related to post-op healing as evidenced by estimated nutrition needs.  Goal: Pt to meet >/= 90% of their estimated nutrition needs   Monitor:  PO & supplemental intake, weight, labs, I/O's  ASSESSMENT: 80 year old white female who fell yesterday and broke her hip. She did not strike her head. She did not lose consciousness. She came in with thrombocytopenia with platelet count of 21K. CT scan of the head showed some mild subdural blood layering over the tentorium and into inter-falcine space.  Underwent R hip arthroplasty on 4/10.  BSE completed by SLP on 4/12 - recommend Regular diet with thin liquids. Per chart, pt does not like the foods that are being provided by the hospital, and SLP has encouraged son in bring in food items as able.  Pt has eaten about 50% of lunch today. She is currently sleeping soundly. Per RN, pt doesn't drink much and would probably tolerate Ensure Pudding better than Ensure Complete - will change.  Height: Ht Readings from Last 1 Encounters:  09/03/13 5\' 6"  (1.676 m)    Weight: Wt Readings from Last 1 Encounters:  09/04/13 110 lb 0.2 oz (49.9 kg)  Admit wt 111 lb - stable  BMI:  Body mass index is 17.76 kg/(m^2). Underweight  Estimated Nutritional Needs: Kcal: 1400-1600 Protein: 65-75 gm Fluid: >/= 1.5 L  Skin: R hip incision  Diet Order: General   EDUCATION NEEDS: -No education needs identified at this time   Intake/Output Summary (Last 24 hours) at 09/08/13 1216 Last data filed at 09/08/13 1126  Gross per 24 hour   Intake    175 ml  Output   1725 ml  Net  -1550 ml    Last BM: 4/13  Labs:   Recent Labs Lab 09/06/13 0310 09/07/13 0525 09/08/13 0940  NA 138 139 138  K 4.3 4.1 4.3  CL 101 99 96  CO2 27 25 31   BUN 33* 31* 24*  CREATININE 0.69 0.55 0.60  CALCIUM 8.4 8.5 8.6  GLUCOSE 118* 115* 104*    Scheduled Meds: . docusate sodium  100 mg Oral BID  . feeding supplement (ENSURE COMPLETE)  237 mL Oral BID BM  . furosemide  20 mg Intravenous Q12H  . furosemide  20 mg Intravenous Once  . senna  1 tablet Oral BID    Continuous Infusions:   Ashley Coke MS, RD, LDN Inpatient Registered Dietitian Pager: [phone] After-hours pager: [phone]

## 2013-09-08 NOTE — Progress Notes (Signed)
   CARE MANAGEMENT NOTE 09/08/2013  Patient:  MYRICAL, ANDUJO   Account Number:  [id number]  Date Initiated:  09/08/2013  Documentation initiated by:  [facility]  Subjective/Objective Assessment:   multiple falls, Right hip fracture midcervical femoral neck, Athroplasty Bipolar Hip     Action/Plan:   CIR vs SNF   Anticipated DC Date:     Anticipated DC Plan:  IP REHAB FACILITY      DC Planning Services  CM consult      Choice offered to / List presented to:             Status of service:  Completed, signed off Medicare Important Message given?   (If response is "NO", the following Medicare IM given date fields will be blank) Date Medicare IM given:   Date Additional Medicare IM given:    Discharge Disposition:    Per UR Regulation:    If discussed at Long Length of Stay Meetings, dates discussed:    Comments:  09/08/2013 1100 UR completed. IP rehab consult.  Jonnie Finner RN CCM Case Mgmt phone [phone]

## 2013-09-08 NOTE — Progress Notes (Addendum)
CSW received referral from [facility] for possible SNF for this patient.  Physical Therapy is currently recommending CIR; OT however, is recommending SNF.   CSW will talk to patient/family about possible "back--up" option of SNF vs home with home health in case CIR is unable to offer a bed.  Brief SW psychosocial assessment to follow.  Lorie Phenix. [place], [place]

## 2013-09-08 NOTE — Progress Notes (Signed)
Physical Therapy Treatment Patient Details Name: Ashley Lutz MRN: [id number] DOB: [birth year] Today's Date: 09/08/2013    History of Present Illness Adm 09/02/13 s/p fall (getting up from chair; denied syncope)  with Rt hip fx. Pt required cardiac clearance prior to surgery. 4/10 Rt hip bipolar prosthesis     PT Comments    Pt progressing towards functional goals demonstrating ability to tolerate amb on this date and decrease need to one person for transfers. Pt's d/c plan and frequency updated to recommend SNF to maximize independence for safe return home. Pt to continue to benefit from skilled acute PT to address limitations below.   Follow Up Recommendations  SNF     Equipment Recommendations  None recommended by PT    Recommendations for Other Services       Precautions / Restrictions Precautions Precautions: Posterior Hip;Fall Precaution Booklet Issued: Yes (comment) Restrictions Weight Bearing Restrictions: Yes RLE Weight Bearing: Weight bearing as tolerated    Mobility  Bed Mobility Overal bed mobility: Needs Assistance Bed Mobility: Sit to Supine     Supine to sit: Max assist     General bed mobility comments: pt able to bring L LE EOB. pt req max A to bring R LE EOB. pt req max A for trunk control and LE movements to come to sit. verbal and tactile cues for hand placement and sequencing for safety   Transfers Overall transfer level: Needs assistance Equipment used: Rolling walker (2 wheeled) Transfers: Stand Pivot Transfers;Sit to/from Stand Sit to Stand: Max assist Stand pivot transfers: Max assist       General transfer comment: pt improved transfer ability to only req one person max A. pt req max verbal and tactile cues for pivot sequencing. pt req Bilat UE support with RW along with max A to complete transfers. pt demonstrated LOB twice during pivot to [facility]. green hospital socks did not provide appropriate grip on floor, recommend pt to wear yellow  socks. pt very uneasy with RW and req frequent verbal cues for sequencing and hand placement   Ambulation/Gait Ambulation/Gait assistance: Max assist;+2 physical assistance Ambulation Distance (Feet): 12 Feet Assistive device: Rolling walker (2 wheeled) Gait Pattern/deviations: Step-to pattern;Decreased stride length;Trunk flexed;Narrow base of support;Shuffle Gait velocity: slow; guarded for falls   General Gait Details: pt very unsteady during amb. Pt req RW for Bilat UE support. max verbal and tactile cues for step sequencing and control of RW advancement. pt unable to follow sequencing commands consistently. pt req 2nd person from behind to facilitate hip sequencing and more upright positioning. Pt experienced SOB during amb.     Stairs            Wheelchair Mobility    Modified Rankin (Stroke Patients Only)       Balance Overall balance assessment: Needs assistance Sitting-balance support: Feet supported;Single extremity supported Sitting balance-Leahy Scale: Fair Sitting balance - Comments: pt able to sit EOB with L UE support for 5 min. pt demonstrates slouched flexed trunk position in sitting.   Standing balance support: Bilateral upper extremity supported Standing balance-Leahy Scale: Poor Standing balance comment: pt able to stand with Bilat UE support with RW and two hand mod A for approximately 3 min to allow cleaning after BM on BSC. pt unsteady standing and voices poor confidence to maintain standing position. pt req verbal and tactile cues to achieve full upright standing posture.  Cognition Arousal/Alertness: Awake/alert Behavior During Therapy: WFL for tasks assessed/performed Overall Cognitive Status: Within Functional Limits for tasks assessed       Memory: Decreased recall of precautions              Exercises Total Joint Exercises Ankle Circles/Pumps: Both;10 reps;AROM;Supine Quad Sets: Strengthening;5  reps;Right;Supine Heel Slides: Strengthening;Right;5 reps;Supine    General Comments General comments (skin integrity, edema, etc.): pt complains of L foot pain with PROM DF. pt req BSC for incontinent BM prior tp sitting EOB. Pt unable to hold BM through stand pivot transfer to [facility] and proceeded to soil bed and floor.       Pertinent Vitals/Pain Pt reports Rt ankle pain with PROM into DF. Pt experienced SOB during amb.    Home Living                      Prior Function            PT Goals (current goals can now be found in the care plan section) Acute Rehab PT Goals Patient Stated Goal: none stated PT Goal Formulation: With patient Time For Goal Achievement: 09/12/13 Potential to Achieve Goals: Good Progress towards PT goals: Progressing toward goals    Frequency  Min 3X/week    PT Plan Discharge plan needs to be updated;Frequency needs to be updated    Co-evaluation             End of Session Equipment Utilized During Treatment: Gait belt;Oxygen (2L O2. RW) Activity Tolerance: Patient limited by fatigue Patient left: in chair;with call bell/phone within reach;with family/visitor present     Time: 5176-1607 PT Time Calculation (min): 44 min  Charges:                       G Codes:      Manley Mason SPT September 09, 2013, 4:43 PM

## 2013-09-08 NOTE — Progress Notes (Signed)
VASCULAR LAB PRELIMINARY  PRELIMINARY  PRELIMINARY  PRELIMINARY  Bilateral lower extremity venous duplex  completed.    Preliminary report:  Bilateral:  No evidence of DVT, superficial thrombosis, or Baker's Cyst.    Nani Ravens, RVT 09/08/2013, 12:50 PM

## 2013-09-08 NOTE — Progress Notes (Addendum)
     Subjective:  Patient reports pain as mild.  Complains of pain around her ankle, as well as numbness in the leg.    Objective:   VITALS:   Filed Vitals:   09/07/13 2340 09/08/13 0000 09/08/13 0350 09/08/13 0700  BP: 118/67  142/53   Pulse: 108  85   Temp: 99.2 F (37.3 C)  98 F (36.7 C) 98.8 F (37.1 C)  TempSrc: Oral  Oral Oral  Resp: 23  18   Height:      Weight:      SpO2: 94% 88% 97%     Incision: dressing C/D/I and no drainage She is able to fire EHL, and subjectively can feel throughout the sciatic distribution, although she will not dorsiflex her ankle with much strength.    Lab Results  Component Value Date   WBC PENDING 09/08/2013   HGB 8.3* 09/08/2013   HCT 25.8* 09/08/2013   MCV 90.2 09/08/2013   PLT PENDING 09/08/2013     Assessment/Plan: 4 Days Post-Op   Principal Problem:   Fracture of femoral neck, right Active Problems:   Thrombocytopenia   SDH (subdural hematoma)   Myelodysplastic syndrome, unspecified   Acute respiratory failure with hypoxia   Aortic heart murmur   Aortic stenosis, severe   Protein-calorie malnutrition, severe   Closed fracture of midcervical section of femur   Leukocytosis, unspecified   I am concerned about her sciatic nerve function, and we will need to monitor closely, as she is at risk given her thrombocytopenia to develop a post-op hematoma affecting the sciatic nerve.  Additionally this is reflected by her persistent anemia, and she might benefit from additional pRBCs, although this will need to be coordinated with cardiology given her risk of fluid overload.    I am ok for her to work with PT/OT WBAT as much as possible.  If her neurologic condition worsens at her foot, consider CT of pelvis to evaluate hematoma.   Ashley Lutz 09/08/2013, 10:51 AM   Ashley Bond, MD Cell [phone]    ADDENDUM;   Attempted to reach family to give update, no answers, left messages on voicemail.

## 2013-09-08 NOTE — Progress Notes (Addendum)
[facility] PROGRESS NOTE  JENNICA TAGLIAFERRI BOF:[id number] DOB: 17-Mar-1919 DOA: 09/02/2013 PCP: Mathews Argyle, MD  Interim summary Patient is a 80 year old white female relatively functional with past medical history of hypertension and underlying myelodysplastic disease causing thrombocytopenia who presented after a mechanical fall on 4/8 evening. Patient was found to have a subdural hematoma as well as a right-sided hip fracture. She was admitted to the hospitalist service and placed in the step down unit.  Patient was evaluated by critical care, neurosurgery, hematology and cardiology. An echocardiogram done noted evidence of severe aortic stenosis and patient was felt to be a high risk candidate for surgery. However alternative options would be to go from being mobile to being bed bound with open fracture which was not felt to be amenable for quality of life.  On admission, patient received platelet transfusion and initially on steroids for what was thought to be ITP, however hematology clarified that likely this was underlying myelodysplastic disease and steroids were not needed. Steroids since then had been tapered off and completed on 4/12. Neurosurgery felt that patient subdural was contained and after followup CT the next morning was stable recommended no further workup unless patient's mental status changed.   Patient underwent a right hip bipolar arthroplasty done 4/10. Post procedure patient was placed in the step down unit. She had some expected acute blood loss anemia, hemoglobin as low as 8 and underwent a transfusion of 1 unit packed red blood cells as recommended by cardiology given her heart issues. Since surgery, patient has had a slowly worsening leukocytosis which started at admission and has been trending up and as of 4/13 is a 29.8. Urinalysis negative and chest x-ray negative . Followup troponin on 4/11 negative. Patient initially was feeling well on 4/12, but quite  fatigued and mildly short of breath now. She is also mildly tachycardic.   Assessment/Plan:   SDH (subdural hematoma): Agree with critical care, likely not from thrombocytopenia directly. Likely sustained from fall. Followup CT scan stable. Cleared by neurosurgery. No further workup needed unless mental status changes develop.  Elevated BNP- IV lasix    Hip fracture: Status post right hip bipolar arthroplasty.  Seen by physical therapy who recommended inpatient rehabilitation. Consults made.    Acute respiratory failure with hypoxia: Stabilized  Severe protein calorie malnutrition: Patient needs criteria in the context of chronic illness. On Ensure complete twice a day  Anemia: Underlying chronic disease but acute blood loss anemia. Hemoglobin at 8.0 on 4/11 and given aortic stenosis.  Transfused 1 unit packed red blood cells on 4/11. Will transfuse another 4/14 slowly with lasix after  Underweight: Patient is 111 lbs w/BMI of 18.1    Aortic stenosis, severe: Noted on echocardiogram.  appreciate cardiology assistance. Blood pressures were maintained during surgery. The following day, pressure slightly low. Discussed with cardiology and given hemoglobin of 8, transfusing 1 unit packed red blood cells  Thrombocytopenia due to suspected myelodysplastic disorder: Seen by hematology. Patient responded to platelet transfusion. Platelets post surgery stable.  Need for steroids. This is not ITP-will wean off  Leukocytosis: Continues to worsen since admission.Patient remains afebrile. Have checked troponin, urinalysis and chest x-ray which are unremarkable.  -? Steroid related -duplex pending  Code Status: Full code  Family Communication: updated son at bedside  Disposition Plan:  CIR- possible transfer out SDU later   Consultants:  Critical care-Feinstein-signed off  Cardiology-Ganji, Cooper  Hematology-Sherrill-signed off  Neurosurgery-Jones-signed off  Trauma  surgery-Byerly-signed off  Orthopedic surgery-Landau  Procedures:  Echocardiogram done 4/9: Severe aortic stenosis  Status post platelet transfusion done 4/10    status post right bipolar hip arthroplasty  Antibiotics:  None  HPI/Subjective:  c/o numbness in right leg  Objective: Filed Vitals:   09/08/13 0700  BP:   Pulse:   Temp: 98.8 F (37.1 C)  Resp:     Intake/Output Summary (Last 24 hours) at 09/08/13 0750 Last data filed at 09/08/13 0350  Gross per 24 hour  Intake    250 ml  Output   1700 ml  Net  -1450 ml   Filed Weights   09/03/13 0600 09/04/13 0415  Weight: 50.7 kg (111 lb 12.4 oz) 49.9 kg (110 lb 0.2 oz)    Exam:   General:  Alert and oriented x2   Cardiovascular: Harsh 3/6 holosystolic murmur, regular rate and rhythm  Respiratory: Clear to auscultation bilaterally, poor respiratory effort   Abdomen: Soft, nontender, nondistended, positive bowel sounds  Musculoskeletal: No clubbing or cyanosis or edema   Data Reviewed: Basic Metabolic Panel:  Recent Labs Lab 09/03/13 1213 09/04/13 1120 09/04/13 1639 09/05/13 0440 09/06/13 0310 09/07/13 0525  NA 139 138 139 139 138 139  K 3.4* 3.5* 3.1* 4.2 4.3 4.1  CL 97 97  --  102 101 99  CO2 27 28  --  25 27 25   GLUCOSE 141* 140* 146* 111* 118* 115*  BUN 14 16  --  21 33* 31*  CREATININE 0.60 0.55  --  0.60 0.69 0.55  CALCIUM 8.9 9.3  --  8.3* 8.4 8.5   Liver Function Tests:  Recent Labs Lab 09/03/13 1213  AST 27  ALT 12  ALKPHOS 63  BILITOT 0.5  PROT 8.0  ALBUMIN 3.7   No results found for this basename: LIPASE, AMYLASE,  in the last 168 hours No results found for this basename: AMMONIA,  in the last 168 hours CBC:  Recent Labs Lab 09/03/13 1213 09/04/13 1120  09/05/13 0530 09/05/13 1530 09/05/13 2050 09/06/13 0310 09/06/13 1624 09/07/13 0525  WBC 12.4* 15.7*  --  18.1*  --   --  18.9*  --  29.8*  NEUTROABS 10.3*  --   --   --   --   --   --   --   --   HGB 10.4*  10.7*  < > 8.4* 8.0* 9.6* 8.7* 9.0* 9.4*  HCT 32.0* 33.2*  < > 24.9* 22.9* 27.4* 25.6* 27.0* 28.7*  MCV 92.2 92.7  --  85.9  --   --  86.8  --  88.6  PLT 59* 51*  --  54*  --   --  40*  --  49*  < > = values in this interval not displayed. Cardiac Enzymes:  Recent Labs Lab 09/05/13 1600 09/07/13 1655  TROPONINI <0.30 <0.30   BNP (last 3 results)  Recent Labs  09/07/13 1655  PROBNP 3126.0*   CBG:  Recent Labs Lab 09/05/13 0021  GLUCAP 128*    Recent Results (from the past 240 hour(s))  MRSA PCR SCREENING     Status: None   Collection Time    09/03/13  6:19 AM      Result Value Ref Range Status   MRSA by PCR NEGATIVE  NEGATIVE Final   Comment:            The GeneXpert MRSA Assay (FDA     approved for NASAL specimens     only), is one component of a  comprehensive MRSA colonization     surveillance program. It is not     intended to diagnose MRSA     infection nor to guide or     monitor treatment for     MRSA infections.     Studies: Dg Chest Port 1 View  09/06/2013   CLINICAL DATA:  Post hip repair, now with worsening white blood cell count, evaluate for pneumonia  EXAM: PORTABLE CHEST - 1 VIEW  COMPARISON:  DG CHEST 1 VIEW dated 09/03/2013; DG CHEST 2V dated 11/05/2011; DG RIBS UNILATERAL W/CHEST*L* dated 04/30/2007  FINDINGS: Grossly unchanged enlarged cardiac silhouette and mediastinal contours with atherosclerotic plaque within the thoracic aorta. Grossly unchanged left basilar/ retrocardiac heterogeneous opacities favored to represent atelectasis. There is unchanged diffuse nodular thickening of the pulmonary system. There is chronic mild elevation of the right hemidiaphragm. No pleural effusion pneumothorax. No evidence of edema. No acute osseus abnormalities.  IMPRESSION: 1. No acute cardiopulmonary disease. Specifically, no evidence of pneumonia. 2. Similar findings of left basilar atelectasis/scar and bronchitic change.   Electronically Signed   By: Sandi Mariscal  M.D.   On: 09/06/2013 10:51    Scheduled Meds: . docusate sodium  100 mg Oral BID  . feeding supplement (ENSURE COMPLETE)  237 mL Oral BID BM  . furosemide  20 mg Intravenous Q12H  . senna  1 tablet Oral BID   Continuous Infusions:    Principal Problem:   SDH (subdural hematoma) Active Problems:   Hip fracture   Myelodysplastic syndrome, unspecified   Chronic ITP (idiopathic thrombocytopenia)   Acute respiratory failure with hypoxia   Aortic heart murmur   Aortic stenosis, severe    Time spent: 35 minutes    Geradine Girt  [facility] Pager [phone] If 7PM-7AM, please contact night-coverage at [email or web address], password [facility] 09/08/2013, 7:50 AM  LOS: 6 days

## 2013-09-08 NOTE — Progress Notes (Signed)
I met with pt and son at bedside. Pt resting. I discussed that pt not currently at a level to be able to tolerate intense inpt rehab therapy. Current recommendation is for SNF. I will follow her progress over the next 48 hrs to continue to assess her tolerance level. I have discussed with SW. [phone]

## 2013-09-08 NOTE — Progress Notes (Signed)
Physical Therapy Treatment Patient Details Name: Ashley Lutz MRN: [id number] DOB: [birth year] Today's Date: 09/08/2013    History of Present Illness Adm 09/02/13 s/p fall (getting up from chair; denied syncope)  with Rt hip fx. Pt required cardiac clearance prior to surgery. 4/10 Rt hip bipolar prosthesis     PT Comments    Pt progressing towards functional goals demonstrating ability to tolerate amb on this date and decrease need to one person for transfers. Pt's d/c plan and frequency updated to recommend SNF to maximize independence for safe return home. Pt to continue to benefit from skilled acute PT to address limitations below.   Follow Up Recommendations  SNF     Equipment Recommendations  None recommended by PT    Recommendations for Other Services       Precautions / Restrictions Precautions Precautions: Posterior Hip;Fall Precaution Booklet Issued: Yes (comment) Restrictions Weight Bearing Restrictions: Yes RLE Weight Bearing: Weight bearing as tolerated    Mobility  Bed Mobility Overal bed mobility: Needs Assistance Bed Mobility: Sit to Supine     Supine to sit: Max assist     General bed mobility comments: pt able to bring L LE EOB. pt req max A to bring R LE EOB. pt req max A for trunk control and LE movements to come to sit. verbal and tactile cues for hand placement and sequencing for safety   Transfers Overall transfer level: Needs assistance Equipment used: Rolling walker (2 wheeled) Transfers: Stand Pivot Transfers;Sit to/from Stand Sit to Stand: Max assist Stand pivot transfers: Max assist       General transfer comment: pt improved transfer ability to only req one person max A. pt req max verbal and tactile cues for pivot sequencing. pt req Bilat UE support with RW along with max A to complete transfers. pt demonstrated LOB twice during pivot to [facility]. green hospital socks did not provide appropriate grip on floor, recommend pt to wear yellow  socks. pt very uneasy with RW and req frequent verbal cues for sequencing and hand placement   Ambulation/Gait Ambulation/Gait assistance: Max assist;+2 physical assistance Ambulation Distance (Feet): 12 Feet Assistive device: Rolling walker (2 wheeled) Gait Pattern/deviations: Step-to pattern;Decreased stride length;Trunk flexed;Narrow base of support;Shuffle Gait velocity: slow; guarded for falls   General Gait Details: pt very unsteady during amb. Pt req RW for Bilat UE support. max verbal and tactile cues for step sequencing and control of RW advancement. pt unable to follow sequencing commands consistently. pt req 2nd person from behind to facilitate hip sequencing and more upright positioning. Pt experienced SOB during amb.     Stairs            Wheelchair Mobility    Modified Rankin (Stroke Patients Only)       Balance Overall balance assessment: Needs assistance Sitting-balance support: Feet supported;Single extremity supported Sitting balance-Leahy Scale: Fair Sitting balance - Comments: pt able to sit EOB with L UE support for 5 min. pt demonstrates slouched flexed trunk position in sitting.   Standing balance support: Bilateral upper extremity supported Standing balance-Leahy Scale: Poor Standing balance comment: pt able to stand with Bilat UE support with RW and two hand mod A for approximately 3 min to allow cleaning after BM on BSC. pt unsteady standing and voices poor confidence to maintain standing position. pt req verbal and tactile cues to achieve full upright standing posture.  Cognition Arousal/Alertness: Awake/alert Behavior During Therapy: WFL for tasks assessed/performed Overall Cognitive Status: Within Functional Limits for tasks assessed       Memory: Decreased recall of precautions              Exercises Total Joint Exercises Ankle Circles/Pumps: Both;10 reps;AROM;Supine Quad Sets: Strengthening;5  reps;Right;Supine Heel Slides: Strengthening;Right;5 reps;Supine    General Comments General comments (skin integrity, edema, etc.): pt complains of L foot pain with PROM DF. pt req BSC for incontinent BM prior tp sitting EOB. Pt unable to hold BM through stand pivot transfer to [facility] and proceeded to soil bed and floor.       Pertinent Vitals/Pain Pt reports Rt ankle pain with PROM into DF. Pt experienced SOB during amb.    Home Living                      Prior Function            PT Goals (current goals can now be found in the care plan section) Acute Rehab PT Goals Patient Stated Goal: none stated PT Goal Formulation: With patient Time For Goal Achievement: 09/12/13 Potential to Achieve Goals: Good Progress towards PT goals: Progressing toward goals    Frequency  Min 3X/week    PT Plan Discharge plan needs to be updated;Frequency needs to be updated    Co-evaluation             End of Session Equipment Utilized During Treatment: Gait belt;Oxygen (2L O2. RW) Activity Tolerance: Patient limited by fatigue Patient left: in chair;with call bell/phone within reach;with family/visitor present     Time: 3976-7341 PT Time Calculation (min): 44 min  Charges:                       G Codes:      Manley Mason SPT 09/28/13, 4:43 PM  Agree with above assessment.  Kittie Plater, PT, DPT Pager #: [phone] Office #: [phone]

## 2013-09-08 NOTE — Progress Notes (Addendum)
Subjective:  Mild right hip pain, and also c/o severe pain just above the ankle. Per nursing drowsy today.   Objective:  Vital Signs in the last 24 hours: Temp:  [98 F (36.7 C)-99.2 F (37.3 C)] 98.4 F (36.9 C) (04/14 1100) Pulse Rate:  [85-108] 85 (04/14 0350) Resp:  [18-23] 18 (04/14 0350) BP: (110-142)/(49-67) 142/53 mmHg (04/14 0350) SpO2:  [88 %-97 %] 97 % (04/14 0350)  Intake/Output from previous day: 04/13 0701 - 04/14 0700 In: 250 [P.O.:250] Out: 1700 [Urine:1700]  Physical Exam: Answers appropriately and a ox3.  General appearance: alert, cooperative, appears stated age and no distress  Lungs: clear to auscultation bilaterally  Chest wall: no tenderness  Heart: S1 is normal, S2 is muffled. There is a 3/6 ejection systolic murmur heard in the apex and also right sternal border, conducted to the carotids. No gallop appreciated.  Abdomen: soft, non-tender; bowel sounds normal; no masses, no organomegaly Right hip site without hematoma or discharge. Surgical site appears good. Pulses normal and intact. Neuro: No defecits   Lab Results: BMP  Recent Labs  09/06/13 0310 09/07/13 0525 09/08/13 0940  NA 138 139 138  K 4.3 4.1 4.3  CL 101 99 96  CO2 27 25 31   GLUCOSE 118* 115* 104*  BUN 33* 31* 24*  CREATININE 0.69 0.55 0.60  CALCIUM 8.4 8.5 8.6  GFRNONAA 73* 78* 76*  GFRAA 84* >90 88*    CBC  Recent Labs Lab 09/03/13 1213  09/08/13 0940  WBC 12.4*  < > 19.5*  RBC 3.47*  < > 2.86*  HGB 10.4*  < > 8.3*  HCT 32.0*  < > 25.8*  PLT 59*  < > 39*  MCV 92.2  < > 90.2  MCH 30.0  < > 29.0  MCHC 32.5  < > 32.2  RDW 17.4*  < > 16.4*  LYMPHSABS 1.0  --   --   MONOABS 1.1*  --   --   EOSABS 0.0  --   --   BASOSABS 0.0  --   --   < > = values in this interval not displayed.  HEMOGLOBIN A1C Lab Results  Component Value Date   HGBA1C 5.5 09/06/2013    Cardiac Panel (last 3 results)  Recent Labs  09/05/13 1600 09/07/13 1655  TROPONINI <0.30 <0.30     BNP (last 3 results)  Recent Labs  09/07/13 1655  PROBNP 3126.0*    TSH No results found for this basename: TSH,  in the last 8760 hours  CHOLESTEROL No results found for this basename: CHOL,  in the last 8760 hours  Hepatic Function Panel  Recent Labs  12/01/12 0355 09/03/13 1213  PROT 7.5 8.0  ALBUMIN 3.5 3.7  AST 15 27  ALT 12 12  ALKPHOS 54 63  BILITOT 0.8 0.5    Cardiac Studies:  09/03/2013: EKG: Normal sinus rhythm, possible left atrial abnormality, leftward axis, no evidence of ischemia. Otherwise normal EKG.   Assessment/Plan:  1. Severe aortic stenosis with a aortic valve area of 0.74 cm, mean gradient of 69 mm mercury. Echocardiogram performed on 09/03/2013 revealing normal left systolic function.  2. Mild mitral stenosis with a mean gradient of 7 mm mercury.  3. Myelodysplastic syndrome, chronic thrombocytopenia  4. Right hip fracture, small subdural hematoma secondary to fall. S/P right hip arthroplasty and has done well. 5. Blood loss anemia. 6. Right ankle pain. No obvious lesions. Venous duplex ordered.  OFB:PZWCH with transfusion as the Hb has  decreased for 10 gm% to 8.3. Patient appears to feel weak today. From cardiac standpoint doing well. Continue rehab if DVT negative. Ortho input regarding right ankle pain.  Laverda Page, M.D. 09/08/2013, 12:28 PM [facility] Pager: [phone] Office: [phone] If no answer: [phone]   She is stable from cardiac standpoint and I will see her in the OP basis. AVS contact updated.

## 2013-09-08 NOTE — Progress Notes (Signed)
SW Student attempted to speak with pt and son about need for SNF placement as [facility] will not be able to offer a bed. Pt was sleeping soundly and attempted to contact son. Messages have been left to call back. SNF bed list left in pt's room. Will ask CSW Supervisor to follow up with pt and family for SNF search.  Donnella Sham, [place] Student Intern [phone]

## 2013-09-08 NOTE — Consult Note (Signed)
Physical Medicine and Rehabilitation Consult Reason for Consult: Right femoral neck fracture/SDH Referring Physician: [facility]   HPI: Ashley Lutz is a 80 y.o. right-handed female with history of thrombocytopenia, hypertension. Patient independent with a cane prior to admission living with her son. Admitted 09/03/2013 after mechanical fall without loss of consciousness and was found by her son complaining of right hip pain. Cranial CT scan showed a mild subdural hemorrhage left tentorium and falx. Patient was x-rays and imaging of right hip showed right displaced femoral neck fracture. Neurosurgery Dr. Sherley Bounds recommended conservative care of subdural hemorrhage. Preoperative echocardiogram with ejection fraction 65% with severe aortic stenosis and normal left systolic function. Patient was seen by Dr. Einar Gip. of cardiology services and cleared for surgery. Patient underwent right bipolar hip arthroplasty 09/04/2013 per Dr. Mardelle Matte. Weightbearing as tolerated right lower extremity. Close monitoring of platelet counts noted history of thrombocytopenia latest platelet count 49,000. Postoperative pain control. Venous Dopplers lower extremities are pending. Physical and occupational therapy evaluations completed with recommendations for physical medicine rehabilitation consult.   Review of Systems  Respiratory:       SOB with exertion  Musculoskeletal: Positive for myalgias.  Psychiatric/Behavioral:       Limited STM  All other systems reviewed and are negative.  Past Medical History  Diagnosis Date  . Thrombocytopenia 2010  . Hypertension   . Heart murmur   . Wrist fracture, left   . Chronic idiopathic monocytosis   . Enlargement of lymph nodes     H/O small bilateral axillary lymph nodes  . Shortness of breath     with excertion   Past Surgical History  Procedure Laterality Date  . Hemorrhoid surgery    . Bunionectomy    . Soft tissue cyst excision      X 5  occasions--cysts on scalp   History reviewed. No pertinent family history. Social History:  reports that she has never smoked. She does not have any smokeless tobacco history on file. She reports that she does not drink alcohol or use illicit drugs. Allergies:  Allergies  Allergen Reactions  . Milk-Related Compounds Other (See Comments)    Lactose intolerant   Medications Prior to Admission  Medication Sig Dispense Refill  . amLODipine (NORVASC) 5 MG tablet Take 5 mg by mouth daily.      . calcium-vitamin D (OSCAL WITH D) 250-125 MG-UNIT per tablet Take 1 tablet by mouth daily.      . Multiple Vitamins-Minerals (CENTRUM SILVER PO) Take 1 tablet by mouth daily.      . ramipril (ALTACE) 10 MG capsule Take 10 mg by mouth daily.        Home: Home Living Family/patient expects to be discharged to:: Inpatient rehab Additional Comments: Lived with son PTA, used cane, however very independent with no h/o falls  Functional History: Prior Function Level of Independence: Independent with assistive device(s) Comments: cane Functional Status:  Mobility: Bed Mobility Overal bed mobility: Needs Assistance;+2 for physical assistance;+ 2 for safety/equipment Bed Mobility: Sit to Supine Supine to sit: +2 for physical assistance;Total assist General bed mobility comments: total A to lift both legs to bed and lower trunk Transfers Overall transfer level: Needs assistance Equipment used: Rolling walker (2 wheeled) Transfers: Sit to/from [facility] Sit to Stand: +2 physical assistance;+2 safety/equipment;Max assist Stand pivot transfers: Total assist;+2 physical assistance;+2 safety/equipment General transfer comment: increased assistance required this pm after pt had been sitting in chair for 2 hours Ambulation/Gait Ambulation/Gait  assistance: Min assist;+2 physical assistance;+2 safety/equipment Ambulation Distance (Feet): 2 Feet Assistive device: Rolling walker (2  wheeled) Gait Pattern/deviations: Step-to pattern;Decreased stride length;Shuffle;Antalgic;Leaning posteriorly General Gait Details: pt unable to ambulate today due to BM however able to take 5 steps to [facility] with maximal directional verbal cues to sequence steps and manage RW    ADL: ADL Overall ADL's : Needs assistance/impaired Eating/Feeding: Set up Grooming: Set up Upper Body Bathing: Minimal assitance Lower Body Bathing: Total assistance;Sit to/from stand Upper Body Dressing : Minimal assistance Lower Body Dressing: Total assistance;Sit to/from stand Toilet Transfer: +2 for safety/equipment;+2 for physical assistance;Total assistance Toilet Transfer Details (indicate cue type and reason): Pt with posterior lean - pushing back during toilet trasnfer Toileting- Clothing Manipulation and Hygiene: Total assistance Functional mobility during ADLs: +2 for safety/equipment;+2 for physical assistance;Total assistance General ADL Comments: Pt fatigues easeily amnd required +2 assistance  Cognition: Cognition Overall Cognitive Status: Within Functional Limits for tasks assessed Orientation Level: Oriented X4 Cognition Arousal/Alertness: Awake/alert Behavior During Therapy: WFL for tasks assessed/performed Overall Cognitive Status: Within Functional Limits for tasks assessed Memory: Decreased short-term memory  Blood pressure 142/53, pulse 85, temperature 98.8 F (37.1 C), temperature source Oral, resp. rate 18, height 5\' 6"  (1.676 m), weight 110 lb 0.2 oz (49.9 kg), SpO2 97.00%. Physical Exam  Vitals reviewed. HENT:  Bruise right forehead  Eyes: EOM are normal.  Neck: Normal range of motion. Neck supple. No thyromegaly present.  Cardiovascular: Normal rate and regular rhythm.   Respiratory: Effort normal and breath sounds normal. No respiratory distress.  GI: Soft. Bowel sounds are normal. She exhibits no distension.  Musculoskeletal: She exhibits edema.  Right leg tender/dressed.    Neurological: She is alert.  Patient appropriate for age, date of birth in place. She had difficulty naming the current month and needs cues.Follows simple commands. Keeps eyes closed a lot. Moves all 4's except for RLE which is quite tender. Fair attention. Limited awareness and insight  Skin:  Hip incision clean and dry . Appropriately tender    Results for orders placed during the hospital encounter of 09/02/13 (from the past 24 hour(s))  PRO B NATRIURETIC PEPTIDE     Status: Abnormal   Collection Time    09/07/13  4:55 PM      Result Value Ref Range   Pro B Natriuretic peptide (BNP) 3126.0 (*) 0 - 450 pg/mL  TROPONIN I     Status: None   Collection Time    09/07/13  4:55 PM      Result Value Ref Range   Troponin I <0.30  <0.30 ng/mL   Dg Chest Port 1 View  09/06/2013   CLINICAL DATA:  Post hip repair, now with worsening white blood cell count, evaluate for pneumonia  EXAM: PORTABLE CHEST - 1 VIEW  COMPARISON:  DG CHEST 1 VIEW dated 09/03/2013; DG CHEST 2V dated 11/05/2011; DG RIBS UNILATERAL W/CHEST*L* dated 04/30/2007  FINDINGS: Grossly unchanged enlarged cardiac silhouette and mediastinal contours with atherosclerotic plaque within the thoracic aorta. Grossly unchanged left basilar/ retrocardiac heterogeneous opacities favored to represent atelectasis. There is unchanged diffuse nodular thickening of the pulmonary system. There is chronic mild elevation of the right hemidiaphragm. No pleural effusion pneumothorax. No evidence of edema. No acute osseus abnormalities.  IMPRESSION: 1. No acute cardiopulmonary disease. Specifically, no evidence of pneumonia. 2. Similar findings of left basilar atelectasis/scar and bronchitic change.   Electronically Signed   By: Sandi Mariscal M.D.   On: 09/06/2013 10:51  Assessment/Plan: Diagnosis: right Femoral neck fx and SDH after fall 1. Does the need for close, 24 hr/day medical supervision in concert with the patient's rehab needs make it unreasonable  for this patient to be served in a less intensive setting? Potentially 2. Co-Morbidities requiring supervision/potential complications: AS, myelodysplastic syndrome 3. Due to bladder management, bowel management, safety, skin/wound care, disease management, medication administration, pain management and patient education, does the patient require 24 hr/day rehab nursing? Potentially 4. Does the patient require coordinated care of a physician, rehab nurse, PT (1-2 hrs/day, 5 days/week), OT (1-2 hrs/day, 5 days/week) and SLP (1-2 hrs/day, 5 days/week) to address physical and functional deficits in the context of the above medical diagnosis(es)? Potentially Addressing deficits in the following areas: balance, endurance, locomotion, strength, transferring, bowel/bladder control, bathing, dressing, feeding, grooming, toileting, cognition and swallowing 5. Can the patient actively participate in an intensive therapy program of at least 3 hrs of therapy per day at least 5 days per week? No and Potentially 6. The potential for patient to make measurable gains while on inpatient rehab is fair 7. Anticipated functional outcomes upon discharge from inpatient rehab are min assist  with PT, min assist and mod assist with OT, supervision with SLP. 8. Estimated rehab length of stay to reach the above functional goals is: ?20 days 9. Does the patient have adequate social supports to accommodate these discharge functional goals? Yes 10. Anticipated D/C setting: Home 11. Anticipated post D/C treatments: HH therapy and Outpatient therapy 12. Overall Rehab/Functional Prognosis: good  RECOMMENDATIONS: This patient's condition is appropriate for continued rehabilitative care in the following setting: SNF Patient has agreed to participate in recommended program. Potentially Note that insurance prior authorization may be required for reimbursement for recommended care.  Comment: Frankly, I have a difficult time seeing  her tolerate 3-4 hours of therapy per day. Would like to follow for therapy tolerance. May be more appropriate for SNF.  Meredith Staggers, MD, [facility]     09/08/2013

## 2013-09-09 LAB — CBC
HCT: 30.6 % — ABNORMAL LOW (ref 36.0–46.0)
Hemoglobin: 10.2 g/dL — ABNORMAL LOW (ref 12.0–15.0)
MCH: 29.2 pg (ref 26.0–34.0)
MCHC: 33.3 g/dL (ref 30.0–36.0)
MCV: 87.7 fL (ref 78.0–100.0)
PLATELETS: 36 10*3/uL — AB (ref 150–400)
RBC: 3.49 MIL/uL — AB (ref 3.87–5.11)
RDW: 17 % — ABNORMAL HIGH (ref 11.5–15.5)
WBC: 17.2 10*3/uL — ABNORMAL HIGH (ref 4.0–10.5)

## 2013-09-09 LAB — BASIC METABOLIC PANEL
BUN: 23 mg/dL (ref 6–23)
CO2: 29 meq/L (ref 19–32)
CREATININE: 0.58 mg/dL (ref 0.50–1.10)
Calcium: 8.9 mg/dL (ref 8.4–10.5)
Chloride: 94 mEq/L — ABNORMAL LOW (ref 96–112)
GFR calc Af Amer: 89 mL/min — ABNORMAL LOW (ref >90)
GFR, EST NON AFRICAN AMERICAN: 77 mL/min — AB (ref >90)
Glucose, Bld: 88 mg/dL (ref 70–99)
Potassium: 3.7 mEq/L (ref 3.7–5.3)
Sodium: 137 mEq/L (ref 137–147)

## 2013-09-09 LAB — TYPE AND SCREEN
ABO/RH(D): O POS
ANTIBODY SCREEN: NEGATIVE
Unit division: 0

## 2013-09-09 MED ORDER — HYDROCODONE-ACETAMINOPHEN 5-325 MG PO TABS
1.0000 | ORAL_TABLET | ORAL | Status: DC | PRN
  Administered 2013-09-09 – 2013-09-10 (×3): 1 via ORAL
  Filled 2013-09-09 (×3): qty 1

## 2013-09-09 MED ORDER — MORPHINE SULFATE 2 MG/ML IJ SOLN: 0.5000 mg | INTRAMUSCULAR | Status: DC | PRN

## 2013-09-09 NOTE — Progress Notes (Signed)
Speech Language Pathology Treatment: Dysphagia  Patient Details Name: Ashley Lutz MRN: [id number] DOB: [birth year] Today's Date: 09/09/2013 Time: 0071-2197 SLP Time Calculation (min): 12 min  Assessment / Plan / Recommendation Clinical Impression  F/u after clinical swallowing evaluation: Pt tolerating regular diet, thin liquids with no s/s of aspiration.  Continues with frequent belching while eating and reports early satiety.   Reviewed strategies with pt to facilitate passage of POs through esophagus, but she may need some basic esophageal f/u to r/o motility disorder versus a stricture.  No further SLP f/u is warranted - will sign off.    HPI HPI: Patient is a 80 year old woman with a PMH of chronic ITP managed by Hematologist Dr. Benay Spice, HTN, chronic idiopathic monocytosis, who presents to the ED after a fall. She was found to have right hip fracture and head CT showed small left cerebellar tentorium, left parafalcine and right temporal subdural hematomas, with possible trace left mesial frontal subarachnoid hemorrhage. She was also noted to have respiratory distress with O2Sat 84%. NRB mask was initiated with O2 Sat up to 96%. Bedside swallow evaluation revealed a suspected esophageal component, although SLP f/u was warranted secondary to decreased respiratory support.      SLP Plan  All goals met    Recommendations Diet recommendations: Regular;Thin liquid Liquids provided via: Cup;Straw Medication Administration: Whole meds with liquid Supervision: Patient able to self feed Compensations: Slow rate;Follow solids with liquid Postural Changes and/or Swallow Maneuvers: Upright 30-60 min after meal              Plan: All goals met      Rebecka Oelkers L. [place], [place] CCC/SLP Pager [phone]    Assunta Curtis 09/09/2013, 3:30 PM

## 2013-09-09 NOTE — Clinical Social Work Placement (Addendum)
    Clinical Social Work Department CLINICAL SOCIAL WORK PLACEMENT NOTE 09/08/2013  Patient:  Ashley Lutz, Ashley Lutz  Account Number:  [id number] Admit date:  09/02/2013  Clinical Social Worker:  Butch Penny Kyle Stansell, LCSWA  Date/time:  09/08/2013 05:30 PM  Clinical Social Work is seeking post-discharge placement for this patient at the following level of care:   SKILLED NURSING   (*CSW will update this form in Epic as items are completed)   09/08/2013  Patient/family provided with [place] Department of Clinical Social Work's list of facilities offering this level of care within the geographic area requested by the patient (or if unable, by the patient's family).  09/08/2013  Patient/family informed of their freedom to choose among providers that offer the needed level of care, that participate in Medicare, Medicaid or managed care program needed by the patient, have an available bed and are willing to accept the patient.  09/08/2013  Patient/family informed of [facility]' ownership interest in [facility], as well as of the fact that they are under no obligation to receive care at this facility.  PASARR submitted to EDS on 09/09/2013 PASARR number received from EDS on 09/09/2013  FL2 transmitted to all facilities in geographic area requested by pt/family on  09/09/2013 FL2 transmitted to all facilities within larger geographic area on   Patient informed that his/her managed care company has contracts with or will negotiate with  certain facilities, including the following:   [facility]     Patient/family informed of bed offers received:   Patient chooses bed at  Physician recommends and patient chooses bed at    Patient to be transferred to  on   Patient to be transferred to facility by Ambulance  Corey Harold)  The following physician request were entered in Epic:   Additional Comments:

## 2013-09-09 NOTE — Progress Notes (Signed)
Pt tx 5N per MD order, pt tol well, pt VSS, pt and son verbalized understanding of tx, pt foley intact per NP Lissa Merlin, RN/MD to d/c foley at [facility] due to med change, Report called to receiving RN, updates given at [facility]

## 2013-09-09 NOTE — Progress Notes (Signed)
     Subjective:  Patient reports pain as moderate.  Walked with PT yesterday.  Objective:   VITALS:   Filed Vitals:   09/09/13 0000 09/09/13 0325 09/09/13 0429 09/09/13 0733  BP: 116/59 115/52    Pulse: 87     Temp: 98.9 F (37.2 C) 98.6 F (37 C)  98.1 F (36.7 C)  TempSrc: Oral Oral  Oral  Resp: 17 18    Height:      Weight:   54.9 kg (121 lb 0.5 oz)   SpO2: 99% 98%      Neurologically intact Sensation intact distally Dorsiflexion/Plantar flexion intact Incision: dressing C/D/I and no drainage Ankle dorsiflexion much stronger today.  Denies ankle pain when wb.  Lab Results  Component Value Date   WBC 17.2* 09/09/2013   HGB 10.2* 09/09/2013   HCT 30.6* 09/09/2013   MCV 87.7 09/09/2013   PLT 36* 09/09/2013     Assessment/Plan: 5 Days Post-Op   Principal Problem:   Fracture of femoral neck, right Active Problems:   Thrombocytopenia   SDH (subdural hematoma)   Myelodysplastic syndrome, unspecified   Acute respiratory failure with hypoxia   Aortic heart murmur   Aortic stenosis, severe   Protein-calorie malnutrition, severe   Closed fracture of midcervical section of femur   Leukocytosis, unspecified   Up with therapy Doing much better, ABLA improved with blood, dispo per primary team.   Ashley Lutz 09/09/2013, 8:34 AM   [name], MD Cell [phone]

## 2013-09-09 NOTE — Progress Notes (Signed)
[facility]  Ashley Lutz FGH:[id number] DOB: [birth year] DOA: 09/02/2013 PCP: Mathews Argyle, MD  Time spent : 35 mins  Brief narrative: 80 year old white female relatively functional with past medical history of hypertension and underlying myelodysplastic disease causing thrombocytopenia who presented after a mechanical fall on 4/8 evening. Patient was found to have a subdural hematoma as well as a right-sided hip fracture. She was admitted to the Hospitalist service and placed in the step down unit.   Patient was evaluated by critical care, neurosurgery, hematology and cardiology. An echocardiogram done noted evidence of severe aortic stenosis and patient was felt to be a high risk candidate for surgery. However alternative options would be to go from being mobile to being bed bound with open fracture which was not felt to be amenable for quality of life. On admission, patient received platelet transfusion and initially on steroids for what was thought to be ITP, however hematology clarified that likely this was underlying myelodysplastic disease and steroids were not needed. Steroids since then had been tapered off and completed on 4/12. Neurosurgery felt that patient subdural was contained and after followup CT the next morning was stable recommended no further workup unless patient's mental status changed.   Patient underwent a right hip bipolar arthroplasty done 4/10. Post procedure patient was placed in the step down unit. She had some expected acute blood loss anemia, hemoglobin as low as 8 and underwent a transfusion of 1 unit packed red blood cells as recommended by cardiology given her heart issues. Since surgery, patient has had a slowly worsening leukocytosis which started at admission and has been trending up and as of 4/13 is a 29.8. Urinalysis negative and chest x-ray negative . Followup troponin on 4/11 negative.    HPI/Subjective: Primarily c/o burning pains right leg/foot-also difficulty with swallowing pills and food.  Overall states she feels much better.  Assessment/Plan:    Fracture of femoral neck, right/post right hip bipolar arthroplasty.  -per Ortho -cont PT -SNF for rehab at dc -control pain -DC Foley      SDH (subdural hematoma) -due to pre admit fall -FU CT Head stable -cleared by NS     Thrombocytopenia/Myelodysplastic syndrome, unspecified -appears c/w known chronic issues and labs stable except for component of ABLA post op -has received 4 units PRBCs -was given platelets pre op -Hematology evaluated this admission-was briefly given steroids for presumed ITP but heme dc'd since was not ITP    Leukocytosis -Most likely due to recent steroids - trend is down and no fevevr or other clinical signs of infection    Acute respiratory failure with hypoxia -stable on 2L -PCXR 4/12 without acute changes    Aortic stenosis, severe -avoid dehydration or volume overload - balancing volume status will likely be difficult -Cards recommend treat anemia and keep hgb >8    Protein-calorie malnutrition, severe -appears chronic in nature -PO supplements (Ensure)   DVT prophylaxis: SCDs only due to thrombocytopenia Code Status: Full Family Communication: Son at bedside Disposition Plan/Expected LOS: Transfer to orthopedic floor-plan discharge to skilled nursing facility for rehabilitation since not candidate for CIR-social worker contacted 04/15/215  Consultants: Orthopedics Cardiology Critical care-s/o'd Hematology-s/o'd Neurosurgery-s/o'd Trauma surgery-s/o'd  Procedures: Bilateral Lower Extremity Venous Duplex -No evidence of deep vein thrombosis involving the right lower extremity. - No evidence of deep vein thrombosis involving the left lower extremity.  Transthoracic Echocardiography - Left ventricle: The cavity size was normal. Wall thickness was increased  in a  pattern of moderate LVH. Systolic function was normal. The estimated ejection fraction was in the range of 60% to 65%. - Aortic valve: There was severe stenosis. Valve area: 0.74cm^2(VTI). Valve area: 0.67cm^2 (Vmax). - Mitral valve: Small diastolic gradient without significant MS Severely calcified annulus. Valve area by continuity equation (using LVOT flow): 1.88cm^2. - Left atrium: The atrium was moderately dilated. - Right atrium: The atrium was mildly dilated. - Atrial septum: No defect or patent foramen ovale was identified. - Pulmonary arteries: PA peak pressure: 5mm Hg (S).  Antibiotics: Cefazolin x 2 doses  Objective: Blood pressure 130/108, pulse 79, temperature 98.1 F (36.7 C), temperature source Oral, resp. rate 17, height 5\' 6"  (1.676 m), weight 121 lb 0.5 oz (54.9 kg), SpO2 98.00%.  Intake/Output Summary (Last 24 hours) at 09/09/13 1413 Last data filed at 09/09/13 1100  Gross per 24 hour  Intake  382.5 ml  Output   1025 ml  Net -642.5 ml   Exam: General: No acute respiratory distress Lungs: Clear to auscultation bilaterally without wheezes or crackles, 2L Cardiovascular: Regular rate and rhythm without gallop or rub - harsh 3/6 holosystolic blowing murmur  - no peripheral edema Abdomen: Nontender, nondistended, soft, bowel sounds positive, no rebound, no ascites, no appreciable mass Musculoskeletal: No significant cyanosis, clubbing of bilateral lower extremities Neurological: Alert and oriented x name and place, exam otherwise nonfocal  Scheduled Meds:  Scheduled Meds: . docusate sodium  100 mg Oral BID  . feeding supplement (ENSURE)  1 Container Oral BID BM  . furosemide  20 mg Intravenous Q12H  . senna  1 tablet Oral BID   Data Reviewed: Basic Metabolic Panel:  Recent Labs Lab 09/05/13 0440 09/06/13 0310 09/07/13 0525 09/08/13 0940 09/09/13 0244  NA 139 138 139 138 137  K 4.2 4.3 4.1 4.3 3.7  CL 102 101 99 96 94*  CO2 25 27 25 31 29   GLUCOSE  111* 118* 115* 104* 88  BUN 21 33* 31* 24* 23  CREATININE 0.60 0.69 0.55 0.60 0.58  CALCIUM 8.3* 8.4 8.5 8.6 8.9   Liver Function Tests:  Recent Labs Lab 09/03/13 1213  AST 27  ALT 12  ALKPHOS 63  BILITOT 0.5  PROT 8.0  ALBUMIN 3.7   CBC:  Recent Labs Lab 09/03/13 1213  09/05/13 0530  09/06/13 0310 09/06/13 1624 09/07/13 0525 09/08/13 0940 09/09/13 0244  WBC 12.4*  < > 18.1*  --  18.9*  --  29.8* 19.5* 17.2*  NEUTROABS 10.3*  --   --   --   --   --   --   --   --   HGB 10.4*  < > 8.4*  < > 8.7* 9.0* 9.4* 8.3* 10.2*  HCT 32.0*  < > 24.9*  < > 25.6* 27.0* 28.7* 25.8* 30.6*  MCV 92.2  < > 85.9  --  86.8  --  88.6 90.2 87.7  PLT 59*  < > 54*  --  40*  --  49* 39* 36*  < > = values in this interval not displayed.  Cardiac Enzymes:  Recent Labs Lab 09/05/13 1600 09/07/13 1655  TROPONINI <0.30 <0.30   CBG:  Recent Labs Lab 09/05/13 0021  GLUCAP 128*    Recent Results (from the past 240 hour(s))  MRSA PCR SCREENING     Status: None   Collection Time    09/03/13  6:19 AM      Result Value Ref Range Status   MRSA by  PCR NEGATIVE  NEGATIVE Final   Comment:            The GeneXpert MRSA Assay (FDA     approved for NASAL specimens     only), is one component of a     comprehensive MRSA colonization     surveillance program. It is not     intended to diagnose MRSA     infection nor to guide or     monitor treatment for     MRSA infections.     Studies:  Recent x-ray studies have been reviewed in detail by the Attending Physician       Erin Hearing, ANP [facility] Office  [phone] Pager [phone]  **If unable to Lutz the above provider after paging please contact the [place] @ [phone]  On-Call/Text Page:      [email or web address]      password [email or web address]  If 7PM-7AM, please contact night-coverage [email or web address] Password [email or web address] 09/09/2013, 2:13 PM   LOS: 7 days   I have personally examined this patient and reviewed the entire database.  I have reviewed the above note, made any necessary editorial changes, and agree with its content.  Cherene Altes, MD [facility]

## 2013-09-09 NOTE — ED Provider Notes (Signed)
Medical screening examination/treatment/procedure(s) were conducted as a shared visit with non-physician practitioner(s) and myself.  I personally evaluated the patient during the encounter.   EKG Interpretation   Date/Time:  Thursday September 03 2013 03:29:46 EDT Ventricular Rate:  88 PR Interval:  157 QRS Duration: 96 QT Interval:  399 QTC Calculation: 483 R Axis:   -4 Text Interpretation:  Sinus rhythm Atrial premature complex Probable left  atrial enlargement ED PHYSICIAN INTERPRETATION AVAILABLE IN [place]  [facility] Confirmed by TEST, Record ([id number]) on 09/05/2013 10:06:16 AM       Teressa Lower, MD 09/09/13 2218

## 2013-09-09 NOTE — Clinical Social Work Psychosocial (Addendum)
Clinical Social Work Department BRIEF PSYCHOSOCIAL ASSESSMENT 09/08/2013  Patient:  Ashley Lutz, Ashley Lutz     Account Number:  [id number]     Admit date:  09/02/2013  Clinical Social Worker:  Iona Coach  Date/Time:  09/08/2013 05:30 PM  Referred by:  Physician  Date Referred:  09/08/2013 Referred for  SNF Placement   Other Referral:   Interview type:  Other - See comment Other interview type:   Patient and son    PSYCHOSOCIAL DATA Living Status:  WITH ADULT CHILDREN Admitted from facility:   Level of care:   Primary support name:  Ashley Lutz   [phone] Primary support relationship to patient:  CHILD, ADULT Degree of support available:   Strong support    CURRENT CONCERNS Current Concerns  Post-Acute Placement   Other Concerns:    SOCIAL WORK ASSESSMENT / PLAN Patient referred to [place] services for short term SNF. Patient was referred by PT to CIR; she was assessed and CIR denied patient. Recommend SNF placement.  SW intern- Donnella Sham met with patient and her son today to discuss SNF bed process. Patient and son are agreeable to short term SNF; they stated preferences of [facility], [facility] or [facility] if possible.  Bed search will be initiated; Fl2 placed on chart for MD's signature.   Assessment/plan status:  Psychosocial Support/Ongoing Assessment of Needs Other assessment/ plan:   Information/referral to community resources:   SNF bed list provided to pateint and family    PATIENT'S/FAMILY'S RESPONSE TO PLAN OF CARE: Patient is alert, oriented but hard of hearing. Her son Ashley Lutz is very invovled and supportive of her care. Patient related to SW intern that she has been independent in the past of her ADL's was able to shower, dress herself and make her bed. She also enjoyed walking her dog.  Her son helps her with financial issues (paying her bills etc) but there is no financial or Forensic scientist. Son lives with patient.  They  very appreciiative of SW Intern's visit and were notified the CSW would follow up with them OV:[id number] bed offers.  Marland Kitchen

## 2013-09-10 ENCOUNTER — Encounter: Payer: Self-pay | Admitting: *Deleted

## 2013-09-10 ENCOUNTER — Inpatient Hospital Stay (HOSPITAL_COMMUNITY): Payer: Medicare HMO

## 2013-09-10 LAB — BASIC METABOLIC PANEL
BUN: 19 mg/dL (ref 6–23)
CALCIUM: 8.7 mg/dL (ref 8.4–10.5)
CO2: 29 mEq/L (ref 19–32)
Chloride: 96 mEq/L (ref 96–112)
Creatinine, Ser: 0.48 mg/dL — ABNORMAL LOW (ref 0.50–1.10)
GFR, EST NON AFRICAN AMERICAN: 82 mL/min — AB (ref >90)
GLUCOSE: 91 mg/dL (ref 70–99)
Potassium: 3.7 mEq/L (ref 3.7–5.3)
Sodium: 138 mEq/L (ref 137–147)

## 2013-09-10 LAB — CBC
HCT: 30 % — ABNORMAL LOW (ref 36.0–46.0)
Hemoglobin: 9.9 g/dL — ABNORMAL LOW (ref 12.0–15.0)
MCH: 28.9 pg (ref 26.0–34.0)
MCHC: 33 g/dL (ref 30.0–36.0)
MCV: 87.5 fL (ref 78.0–100.0)
PLATELETS: 45 10*3/uL — AB (ref 150–400)
RBC: 3.43 MIL/uL — ABNORMAL LOW (ref 3.87–5.11)
RDW: 16.4 % — ABNORMAL HIGH (ref 11.5–15.5)
WBC: 15.6 10*3/uL — ABNORMAL HIGH (ref 4.0–10.5)

## 2013-09-10 MED ORDER — ACETAMINOPHEN 325 MG PO TABS: 650.0000 mg | ORAL_TABLET | Freq: Four times a day (QID) | ORAL | Status: AC | PRN

## 2013-09-10 MED ORDER — ENSURE PUDDING PO PUDG: 1.0000 | Freq: Two times a day (BID) | ORAL | Status: DC

## 2013-09-10 MED ORDER — MENTHOL 3 MG MT LOZG: 1.0000 | LOZENGE | OROMUCOSAL | Status: DC | PRN

## 2013-09-10 MED ORDER — PHENOL 1.4 % MT LIQD: 1.0000 | OROMUCOSAL | Status: DC | PRN

## 2013-09-10 MED ORDER — ALUM & MAG HYDROXIDE-SIMETH 200-200-20 MG/5ML PO SUSP: 30.0000 mL | ORAL | Status: DC | PRN

## 2013-09-10 MED ORDER — BISACODYL 10 MG RE SUPP: 10.0000 mg | Freq: Every day | RECTAL | Status: DC | PRN

## 2013-09-10 NOTE — Discharge Summary (Addendum)
Physician Discharge Summary  Ashley Lutz [id number] DOB: Jul 09, 1918 DOA: 09/02/2013  PCP: Mathews Argyle, MD  Admit date: 09/02/2013 Discharge date: 09/10/2013  Time spent: 35 minutes  Recommendations for Outpatient Follow-up:  1. See below  Discharge Diagnoses:  Principal Problem:   Fracture of femoral neck, right Active Problems:   Thrombocytopenia   SDH (subdural hematoma)   Myelodysplastic syndrome, unspecified   Acute respiratory failure with hypoxia   Aortic heart murmur   Aortic stenosis, severe   Protein-calorie malnutrition, severe   Closed fracture of midcervical section of femur   Leukocytosis, unspecified   Discharge Condition: improved  Diet recommendation: regular  Filed Weights   09/04/13 0415 09/09/13 0429 09/09/13 1926  Weight: 49.9 kg (110 lb 0.2 oz) 54.9 kg (121 lb 0.5 oz) 57.1 kg (125 lb 14.1 oz)    Brief narrative:  80 year old white female relatively functional with past medical history of hypertension and underlying myelodysplastic disease causing thrombocytopenia who presented after a mechanical fall on 4/8 evening. Patient was found to have a subdural hematoma as well as a right-sided hip fracture. She was admitted to the Hospitalist service and placed in the step down unit.  Patient was evaluated by critical care, neurosurgery, hematology and cardiology. An echocardiogram done noted evidence of severe aortic stenosis and patient was felt to be a high risk candidate for surgery. However alternative options would be to go from being mobile to being bed bound with open fracture which was not felt to be amenable for quality of life. On admission, patient received platelet transfusion and initially on steroids for what was thought to be ITP, however hematology clarified that likely this was underlying myelodysplastic disease and steroids were not needed. Steroids since then had been tapered off and completed on 4/12. Neurosurgery felt that patient  subdural was contained and after followup CT the next morning was stable recommended no further workup unless patient's mental status changed.  Patient underwent a right hip bipolar arthroplasty done 4/10. Post procedure patient was placed in the step down unit. She had some expected acute blood loss anemia, hemoglobin as low as 8 and underwent a transfusion of 1 unit packed red blood cells as recommended by cardiology given her heart issues. Since surgery, patient has had a slowly worsening leukocytosis which started at admission and has been trending up and as of 4/13 is a 29.8. Urinalysis negative and chest x-ray negative . Followup troponin on 4/11 negative.   Fracture of femoral neck, right/post right hip bipolar arthroplasty.   -cont PT  -SNF for rehab at dc  -control pain   SDH (subdural hematoma)  -due to pre admit fall  -FU CT Head stable  -cleared by NS   Thrombocytopenia/Myelodysplastic syndrome, unspecified  -appears c/w known chronic issues and labs stable except for component of ABLA post op  -has received 4 units PRBCs  -was given platelets pre op  -Hematology evaluated this admission-was briefly given steroids for presumed ITP but heme dc'd since was not ITP   Leukocytosis  -Most likely due to recent steroids - trend is down and no fevevr or other clinical signs of infection   Acute respiratory failure with hypoxia  -stable on 2L  -PCXR 4/12 without acute changes   Aortic stenosis, severe  -avoid dehydration or volume overload - balancing volume status will likely be difficult  -Cards recommend treat anemia and keep hgb >8   Protein-calorie malnutrition, severe  -appears chronic in nature  -PO supplements (Ensure)  Procedures: Bilateral  Lower Extremity Venous Duplex  -No evidence of deep vein thrombosis involving the right lower extremity. - No evidence of deep vein thrombosis involving the left lower extremity.  Transthoracic Echocardiography  - Left  ventricle: The cavity size was normal. Wall thickness was increased in a pattern of moderate LVH. Systolic function was normal. The estimated ejection fraction was in the range of 60% to 65%. - Aortic valve: There was severe stenosis. Valve area: 0.74cm^2(VTI). Valve area: 0.67cm^2 (Vmax). - Mitral valve: Small diastolic gradient without significant MS Severely calcified annulus. Valve area by continuity equation (using LVOT flow): 1.88cm^2. - Left atrium: The atrium was moderately dilated. - Right atrium: The atrium was mildly dilated. - Atrial septum: No defect or patent foramen ovale was identified. - Pulmonary arteries: PA peak pressure: 36mm Hg (S   Consultations:  Ortho  cards  Discharge Exam: Filed Vitals:   09/10/13 0415  BP: 114/62  Pulse: 92  Temp: 98 F (36.7 C)  Resp: 18    General: pleasant Cardiovascular: rrr Respiratory: clear  Discharge Instructions You were cared for by a hospitalist during your hospital stay. If you have any questions about your discharge medications or the care you received while you were in the hospital after you are discharged, you can call the unit and asked to speak with the hospitalist on call if the hospitalist that took care of you is not available. Once you are discharged, your primary care physician will handle any further medical issues. Please note that NO REFILLS for any discharge medications will be authorized once you are discharged, as it is imperative that you return to your primary care physician (or establish a relationship with a primary care physician if you do not have one) for your aftercare needs so that they can reassess your need for medications and monitor your lab values.      Discharge Orders   Future Appointments Provider Department Dept Phone   10/05/2013 2:15 PM [facility] [phone]   10/05/2013 2:45 PM Owens Shark, NP [facility] [phone]   Future Orders Complete By Expires   (HEART FAILURE PATIENTS) Call MD:  Anytime you have any of the following symptoms: 1) 3 pound weight gain in 24 hours or 5 pounds in 1 week 2) shortness of breath, with or without a dry hacking cough 3) swelling in the hands, feet or stomach 4) if you have to sleep on extra pillows at night in order to breathe.  As directed    Diet - low sodium heart healthy  As directed    Discharge instructions  As directed    Discharge instructions  As directed    Increase activity slowly  As directed    Posterior total hip precautions  As directed    Weight bearing as tolerated  As directed    Questions:     Laterality:     Extremity:         Medication List    STOP taking these medications       amLODipine 5 MG tablet  Commonly known as:  NORVASC     ramipril 10 MG capsule  Commonly known as:  ALTACE      TAKE these medications       acetaminophen 325 MG tablet  Commonly known as:  TYLENOL  Take 2 tablets (650 mg total) by mouth every 6 (six) hours as needed for mild pain (or Fever >/=  101).     alum & mag hydroxide-simeth 200-200-20 MG/5ML suspension  Commonly known as:  MAALOX/MYLANTA  Take 30 mLs by mouth every 4 (four) hours as needed for indigestion.     bisacodyl 10 MG suppository  Commonly known as:  DULCOLAX  Place 1 suppository (10 mg total) rectally daily as needed for moderate constipation.     calcium-vitamin D 250-125 MG-UNIT per tablet  Commonly known as:  OSCAL WITH D  Take 1 tablet by mouth daily.     CENTRUM SILVER PO  Take 1 tablet by mouth daily.     feeding supplement (ENSURE) Pudg  Take 1 Container by mouth 2 (two) times daily between meals.     HYDROcodone-acetaminophen 5-325 MG per tablet  Commonly known as:  NORCO  Take 1-2 tablets by mouth every 6 (six) hours as needed. MAXIMUM TOTAL ACETAMINOPHEN DOSE IS 4000 MG PER DAY     menthol-cetylpyridinium 3 MG lozenge  Commonly known as:  CEPACOL   Take 1 lozenge (3 mg total) by mouth as needed for sore throat (sore throat).     phenol 1.4 % Liqd  Commonly known as:  CHLORASEPTIC  Use as directed 1 spray in the mouth or throat as needed for throat irritation / pain.     sennosides-docusate sodium 8.6-50 MG tablet  Commonly known as:  SENOKOT-S  Take 2 tablets by mouth daily.       Allergies  Allergen Reactions  . Milk-Related Compounds Other (See Comments)    Lactose intolerant   Follow-up Information   Follow up with [name], MD. Schedule an appointment as soon as possible for a visit in 2 weeks.   Specialty:  Orthopedic Surgery   Contact information:   [place] [address] [phone]       Follow up with Mathews Argyle, MD In 1 week.   Specialty:  Internal Medicine   Contact information:   [address] [place] [address] [phone]        The results of significant diagnostics from this hospitalization (including imaging, microbiology, ancillary and laboratory) are listed below for reference.    Significant Diagnostic Studies: Dg Chest 1 View  09/03/2013   CLINICAL DATA:  Fall.  EXAM: CHEST - 1 VIEW  COMPARISON:  12/01/2012  FINDINGS: Lungs are adequately inflated with mild prominence of the perihilar markings likely mild vascular congestion. Borderline cardiomegaly. There is calcified plaque over the thoracic aorta. There are degenerate changes spinal bowel curvature of the thoracic spine convex to the right.  IMPRESSION: Borderline cardiomegaly and suggestion of mild vascular congestion.   Electronically Signed   By: Marin Olp M.D.   On: 09/03/2013 01:00   Dg Hip Complete Right  09/03/2013   CLINICAL DATA:  Fall.  EXAM: RIGHT HIP - COMPLETE 2+ VIEW  COMPARISON:  12/01/2012  FINDINGS: There is diffuse decreased bone mineralization. There are symmetric mild-to-moderate degenerative changes of the hips. There is a mildly displaced subcapital  fracture of the right femoral neck. There are degenerative changes of the spine. Calcified plaque is present over the iliac and femoral arteries.  IMPRESSION: Displaced subcapital fracture of the right femoral neck.   Electronically Signed   By: Marin Olp M.D.   On: 09/03/2013 00:58   Ct Head Wo Contrast  09/04/2013   CLINICAL DATA:  subdural hematoma with subarachnoid hemorrhage  EXAM: CT HEAD WITHOUT CONTRAST  TECHNIQUE: Contiguous axial images were obtained from the  base of the skull through the vertex without intravenous contrast.  COMPARISON:  Prior CT from 09/03/2013  FINDINGS: Study is somewhat degraded by motion artifact.  Layering hyperdensity along the left tentorium and posterior falx again seen, compatible with acute subdural hematoma. Overall appearance is unchanged. Focal hyperdensity along the anterior falx is also stable. Again, there may be a small amount of associated subarachnoid hemorrhage within this region (series 2, image 20). No significant mass effect. Thin subdural overlying the right temporal lobe measures 3 mm in maximal diameter, unchanged. Subdural hemorrhage measuring up to 5 mm seen overlying the left occipital lobe (series 2, image 16), also stable. No new intracranial hemorrhage. Ventricles are stable in size without evidence of hydrocephalus. No intraventricular hemorrhage.  No mass lesion or midline shift. No acute large vessel territory infarct. Mild atrophy with chronic microvascular ischemic disease again noted.  Calvarium remains intact. Bilateral ocular lens implants noted. Orbits are otherwise unremarkable. Paranasal sinuses are clear. Partial opacification of the right mastoid air cells is stable from prior. Trace opacity noted within the left mastoid air cells as well.  Scalp soft tissues within normal limits.  IMPRESSION: 1. Stable subdural hemorrhage along the left tentorium and falx as above. Again, there may be a small amount of associated subarachnoid  hemorrhage within the mesial left frontal lobe adjacent to the anterior falx. No significant mass effect. 2. Thin subdurals overlying the right temporal and left occipital lobes, stable. No significant mass effect. 3. No new intracranial hemorrhage or hydrocephalus.   Electronically Signed   By: Jeannine Boga M.D.   On: 09/04/2013 06:03   Ct Head Wo Contrast  09/03/2013   ADDENDUM REPORT: 09/03/2013 03:14  ADDENDUM: Critical Value/emergent results were called by telephone at the time of interpretation on 09/03/2013 at 3:13 AM to [facility] , who verbally acknowledged these results.   Electronically Signed   By: Elon Alas   On: 09/03/2013 03:14   09/03/2013   CLINICAL DATA:  Fall.  EXAM: CT HEAD WITHOUT CONTRAST  TECHNIQUE: Contiguous axial images were obtained from the base of the skull through the vertex without intravenous contrast.  COMPARISON:  None available for comparison at time of study interpretation.  IMPRESSION: The ventricles and sulci are normal for age. No intraparenchymal hemorrhage, mass effect nor midline shift. Patchy to confluent supratentorial white matter hypodensities are within normal range for patient's age and though non-specific suggest sequelae of chronic small vessel ischemic disease. No acute large vascular territory infarcts.  Mild hyperdensity along the left cerebellar tentorium consistent with small subdural hematoma. 2 mm left parafalcine subdural hematoma with apparent trace left mesial frontal subarachnoid blood, axial 21/32. 4 mm right temporal subdural hematoma, axial 15/32. Basal cisterns are patent. Moderate calcific atherosclerosis of the carotid siphons.  Apparent small right temporal scalp hematoma versus redundant skin. No skull fracture. Visualized paranasal sinuses and mastoid aircells are well-aerated. The included ocular globes and orbital contents are non-suspicious. Status post bilateral ocular lens implants. Severe temporal mandibular osteoarthrosis.   Small left cerebellar tentorium, left parafalcine and right temporal subdural hematomas. Possible trace left mesial frontal subarachnoid hemorrhage. No skull fracture.  No intraparenchymal hemorrhage. Involutional changes. Moderate white matter changes suggest chronic small vessel ischemic disease.  Electronically Signed: By: Elon Alas On: 09/03/2013 03:09   Dg Pelvis Portable  09/04/2013   CLINICAL DATA:  Postoperative evaluation.  EXAM: PORTABLE PELVIS 1-2 VIEWS; PORTABLE RIGHT HIP - 1 VIEW  COMPARISON:  None available for comparison at time  of study interpretation.  FINDINGS: Status post right hip hemiarthroplasty with intact well-seated femoral component, no fracture deformity. No dislocation. No destructive bony lesions, patient is osteopenic. Moderate to severe vascular calcifications. Phleboliths project in the pelvis. Severe degenerative change of the included lumbar spine. Right hip subcutaneous gas consistent with recent surgery.  IMPRESSION: Status post right hip hemiarthroplasty with expected postop change.   Electronically Signed   By: Elon Alas   On: 09/04/2013 19:49   Dg Chest Port 1 View  09/06/2013   CLINICAL DATA:  Post hip repair, now with worsening white blood cell count, evaluate for pneumonia  EXAM: PORTABLE CHEST - 1 VIEW  COMPARISON:  DG CHEST 1 VIEW dated 09/03/2013; DG CHEST 2V dated 11/05/2011; DG RIBS UNILATERAL W/CHEST*L* dated 04/30/2007  FINDINGS: Grossly unchanged enlarged cardiac silhouette and mediastinal contours with atherosclerotic plaque within the thoracic aorta. Grossly unchanged left basilar/ retrocardiac heterogeneous opacities favored to represent atelectasis. There is unchanged diffuse nodular thickening of the pulmonary system. There is chronic mild elevation of the right hemidiaphragm. No pleural effusion pneumothorax. No evidence of edema. No acute osseus abnormalities.  IMPRESSION: 1. No acute cardiopulmonary disease. Specifically, no evidence of  pneumonia. 2. Similar findings of left basilar atelectasis/scar and bronchitic change.   Electronically Signed   By: Sandi Mariscal M.D.   On: 09/06/2013 10:51   Dg Hip Portable 1 View Right  09/04/2013   CLINICAL DATA:  Postoperative evaluation.  EXAM: PORTABLE PELVIS 1-2 VIEWS; PORTABLE RIGHT HIP - 1 VIEW  COMPARISON:  None available for comparison at time of study interpretation.  FINDINGS: Status post right hip hemiarthroplasty with intact well-seated femoral component, no fracture deformity. No dislocation. No destructive bony lesions, patient is osteopenic. Moderate to severe vascular calcifications. Phleboliths project in the pelvis. Severe degenerative change of the included lumbar spine. Right hip subcutaneous gas consistent with recent surgery.  IMPRESSION: Status post right hip hemiarthroplasty with expected postop change.   Electronically Signed   By: Elon Alas   On: 09/04/2013 19:49    Microbiology: Recent Results (from the past 240 hour(s))  MRSA PCR SCREENING     Status: None   Collection Time    09/03/13  6:19 AM      Result Value Ref Range Status   MRSA by PCR NEGATIVE  NEGATIVE Final   Comment:            The GeneXpert MRSA Assay (FDA     approved for NASAL specimens     only), is one component of a     comprehensive MRSA colonization     surveillance program. It is not     intended to diagnose MRSA     infection nor to guide or     monitor treatment for     MRSA infections.     Labs: Basic Metabolic Panel:  Recent Labs Lab 09/06/13 0310 09/07/13 0525 09/08/13 0940 09/09/13 0244 09/10/13 0500  NA 138 139 138 137 138  K 4.3 4.1 4.3 3.7 3.7  CL 101 99 96 94* 96  CO2 27 25 31 29 29   GLUCOSE 118* 115* 104* 88 91  BUN 33* 31* 24* 23 19  CREATININE 0.69 0.55 0.60 0.58 0.48*  CALCIUM 8.4 8.5 8.6 8.9 8.7   Liver Function Tests:  Recent Labs Lab 09/03/13 1213  AST 27  ALT 12  ALKPHOS 63  BILITOT 0.5  PROT 8.0  ALBUMIN 3.7   No results found for  this basename: LIPASE, AMYLASE,  in the last 168 hours No results found for this basename: AMMONIA,  in the last 168 hours CBC:  Recent Labs Lab 09/03/13 1213  09/06/13 0310 09/06/13 1624 09/07/13 0525 09/08/13 0940 09/09/13 0244 09/10/13 0500  WBC 12.4*  < > 18.9*  --  29.8* 19.5* 17.2* 15.6*  NEUTROABS 10.3*  --   --   --   --   --   --   --   HGB 10.4*  < > 8.7* 9.0* 9.4* 8.3* 10.2* 9.9*  HCT 32.0*  < > 25.6* 27.0* 28.7* 25.8* 30.6* 30.0*  MCV 92.2  < > 86.8  --  88.6 90.2 87.7 87.5  PLT 59*  < > 40*  --  49* 39* 36* 45*  < > = values in this interval not displayed. Cardiac Enzymes:  Recent Labs Lab 09/05/13 1600 09/07/13 1655  TROPONINI <0.30 <0.30   BNP: BNP (last 3 results)  Recent Labs  09/07/13 1655  PROBNP 3126.0*   CBG:  Recent Labs Lab 09/05/13 0021  GLUCAP 128*       Signed:  Geradine Girt  [facility] 09/10/2013, 11:03 AM

## 2013-09-10 NOTE — Progress Notes (Signed)
     Subjective:  Patient reports pain as mild.  Awake and eating breakfast, no complaints.  Looks well.   Objective:   VITALS:   Filed Vitals:   09/09/13 1112 09/09/13 1628 09/09/13 1926 09/10/13 0415  BP: 109/51 115/61 104/50 114/62  Pulse: 91 84 85 92  Temp:  98.4 F (36.9 C) 98 F (36.7 C) 98 F (36.7 C)  TempSrc:  Oral Oral Oral  Resp: 10 16 16 18   Height:      Weight:   57.1 kg (125 lb 14.1 oz)   SpO2: 98% 97% 100% 92%    Neurologically intact Sensation intact distally Dorsiflexion/Plantar flexion intact Incision: dressing C/D/I and no drainage She will dorsiflex ankle, although still weak, but actually weak on the other side too.  Sensation intact throughout right foot.  Lab Results  Component Value Date   WBC 15.6* 09/10/2013   HGB 9.9* 09/10/2013   HCT 30.0* 09/10/2013   MCV 87.5 09/10/2013   PLT 45* 09/10/2013     Assessment/Plan: 6 Days Post-Op   Principal Problem:   Fracture of femoral neck, right Active Problems:   Thrombocytopenia   SDH (subdural hematoma)   Myelodysplastic syndrome, unspecified   Acute respiratory failure with hypoxia   Aortic heart murmur   Aortic stenosis, severe   Protein-calorie malnutrition, severe   Closed fracture of midcervical section of femur   Leukocytosis, unspecified   Advance diet Up with therapy Discharge to SNF Doing well from my standpoint.  RTC in 2 weeks with me.  WBAT, anticoag contraindicated from subdural hematoma and thrombocytopenia.  Tylenol for pain.   [name] 09/10/2013, 8:17 AM   [name], MD [facility] [phone]

## 2013-09-10 NOTE — Progress Notes (Signed)
SNF as recommended . I will sign off. [phone]

## 2013-09-10 NOTE — Progress Notes (Addendum)
Physical Therapy Treatment Patient Details Name: Ashley Lutz MRN: [id number] DOB: [birth year] Today's Date: 09/10/2013    History of Present Illness Adm 09/02/13 s/p fall (getting up from chair; denied syncope)  with Rt hip fx. Pt required cardiac clearance prior to surgery. 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 Pt main complaint of R ankle pain today. Dr. Eliseo Squires was present and ordered x-rays. Pt tolerated squat pivot transfer with weight on non-affected extremity (L). Will continue to follow pt until d/c, as she will benefit from continued skilled PT for improving independence with functional mobility.  Follow Up Recommendations  SNF;Supervision/Assistance - 24 hour     Equipment Recommendations  None recommended by PT    Recommendations for Other Services       Precautions / Restrictions Precautions Precautions: Posterior Hip;Fall Precaution Booklet Issued: Yes (comment) Precaution Comments: Reviewed pt able to state 1/3 posterior precautions Restrictions Weight Bearing Restrictions: Yes RLE Weight Bearing: Weight bearing as tolerated Other Position/Activity Restrictions: Per conversation with Vann, avoid WB on R ankle until x-ray results are in    Mobility  Bed Mobility Overal bed mobility: Needs Assistance Bed Mobility: Supine to Sit     Supine to sit: Mod assist     General bed mobility comments: pt able to bring L LE EOB. Mod assist for trunk contorl and RLE off of bed. Uses rail to push up [facility] elevated  Transfers Overall transfer level: Needs assistance Equipment used: Rolling walker (2 wheeled) Transfers: Squat Pivot Transfers   Stand pivot transfers: Mod assist;+2 physical assistance Squat pivot transfers: Mod assist;+2 physical assistance     General transfer comment: Pt limited with transfer due to R ankle pain. Performed squat pivot transfer to pt left side. Mod assist +2 for physical  assist. Pt was able to assist by pushing and pivoting on LLE.  Ambulation/Gait                 Stairs            Wheelchair Mobility    Modified Rankin (Stroke Patients Only)       Balance                                    Cognition Arousal/Alertness: Awake/alert Behavior During Therapy: WFL for tasks assessed/performed Overall Cognitive Status: Within Functional Limits for tasks assessed       Memory: Decreased recall of precautions              Exercises Total Joint Exercises Ankle Circles/Pumps: Both;10 reps;AROM;Supine    General Comments General comments (skin integrity, edema, etc.): Pt with extreme tenderness to palpation on R medial malleolus, anterior talus, and around deltoid ligament extending towards navicular. Excessive inversion and plantar flexion with PROM but no complaints of pain. Painful with passive dorsiflexion. Dr. Eliseo Squires, was present during assessment and ordered complete x-ray of ankle.      Pertinent Vitals/Pain Pain with palpation to medial and anterior R ankle. Pt does not give numerical value Nurse aware Pt repositioned for comfort in chair.    Home Living                      Prior Function            PT Goals (current goals can now be found in the  care plan section) Acute Rehab PT Goals Patient Stated Goal: none stated PT Goal Formulation: With patient Time For Goal Achievement: 09/12/13 Potential to Achieve Goals: Good Progress towards PT goals: Progressing toward goals    Frequency  Min 3X/week    PT Plan Current plan remains appropriate    Co-evaluation             End of Session Equipment Utilized During Treatment: Gait belt Activity Tolerance: Patient limited by pain Patient left: in chair;with call bell/phone within reach     Time: 1109-1127 PT Time Calculation (min): 18 min  Charges:  $Therapeutic Activity: 8-22 mins                    G CodesCamille Bal  [place], [place]  Ellouise Newer 09/10/2013, 12:43 PM  Addendum for Dr. Eliseo Squires

## 2013-09-11 ENCOUNTER — Other Ambulatory Visit: Payer: Self-pay | Admitting: *Deleted

## 2013-09-11 MED ORDER — HYDROCODONE-ACETAMINOPHEN 5-325 MG PO TABS: ORAL_TABLET | ORAL | Status: DC

## 2013-09-11 NOTE — Telephone Encounter (Signed)
[facility] 

## 2013-09-14 ENCOUNTER — Encounter: Payer: Self-pay | Admitting: *Deleted

## 2013-09-14 ENCOUNTER — Non-Acute Institutional Stay (SKILLED_NURSING_FACILITY): Payer: Medicare HMO | Admitting: Internal Medicine

## 2013-09-14 DIAGNOSIS — S72009A Fracture of unspecified part of neck of unspecified femur, initial encounter for closed fracture: Secondary | ICD-10-CM

## 2013-09-14 DIAGNOSIS — I6200 Nontraumatic subdural hemorrhage, unspecified: Secondary | ICD-10-CM

## 2013-09-14 DIAGNOSIS — I359 Nonrheumatic aortic valve disorder, unspecified: Secondary | ICD-10-CM

## 2013-09-14 DIAGNOSIS — S72001A Fracture of unspecified part of neck of right femur, initial encounter for closed fracture: Secondary | ICD-10-CM

## 2013-09-14 DIAGNOSIS — S065XAA Traumatic subdural hemorrhage with loss of consciousness status unknown, initial encounter: Secondary | ICD-10-CM

## 2013-09-14 DIAGNOSIS — D62 Acute posthemorrhagic anemia: Secondary | ICD-10-CM | POA: Insufficient documentation

## 2013-09-14 DIAGNOSIS — S065X9A Traumatic subdural hemorrhage with loss of consciousness of unspecified duration, initial encounter: Secondary | ICD-10-CM

## 2013-09-14 DIAGNOSIS — I350 Nonrheumatic aortic (valve) stenosis: Secondary | ICD-10-CM

## 2013-09-14 NOTE — Progress Notes (Signed)
HISTORY & PHYSICAL  DATE: 09/14/2013   FACILITY: [facility]  LEVEL OF CARE: SNF (31)  ALLERGIES:  Allergies  Allergen Reactions  . Milk-Related Compounds Other (See Comments)    Lactose intolerant    CHIEF COMPLAINT:  Manage right femoral neck fx, acute blood loss anemia & AS  HISTORY OF PRESENT ILLNESS:  80 y/o Caucasian female.  HIP FRACTURE: The patient had a mechanical fall and sustained a femur fracture.  Patient subsequently underwent surgical repair and tolerated the procedure well. Patient is admitted to this facility for short-term rehabilitation. Patient denies hip pain currently. No complications reported from the pain medications currently being used.  ANEMIA: The anemia has been stable. The patient denies fatigue, melena or hematochezia. No complications from the medications currently being used.  Pt suffered acute blood loss anemia & underwent transfusion of 1U PRBC.    AORTIC STENOSIS: The aortic stenosis remains stable.  Patient denies SOB, DOE, dizziness or syncopal episodes.  Patient is currently being followed by the cardiologist.  AS is is severe & she is a high risk candidate for surgery.  PAST MEDICAL HISTORY :  Past Medical History  Diagnosis Date  . Thrombocytopenia 2010  . Hypertension   . Heart murmur   . Wrist fracture, left   . Chronic idiopathic monocytosis   . Enlargement of lymph nodes     H/O small bilateral axillary lymph nodes  . Shortness of breath     with excertion    PAST SURGICAL HISTORY: Past Surgical History  Procedure Laterality Date  . Hemorrhoid surgery    . Bunionectomy    . Soft tissue cyst excision      X 5 occasions--cysts on scalp  . Hip arthroplasty Right 09/04/2013    Procedure: ARTHROPLASTY BIPOLAR HIP;  Surgeon: Ashley Bridge, MD;  Location: [place];  Service: Orthopedics;  Laterality: Right;    SOCIAL HISTORY:  reports that she has never smoked. She does not have any smokeless  tobacco history on file. She reports that she does not drink alcohol or use illicit drugs.  FAMILY HISTORY: None  CURRENT MEDICATIONS: Reviewed per MAR  REVIEW OF SYSTEMS:  See HPI otherwise 14 point ROS is negative.  PHYSICAL EXAMINATION  VS:  See VS section  GENERAL: no acute distress, normal body habitus EYES: conjunctivae normal, sclerae normal, normal eye lids MOUTH/THROAT: lips without lesions,no lesions in the mouth,tongue is without lesions,uvula elevates in midline NECK: supple, trachea midline, no neck masses, no thyroid tenderness, no thyromegaly LYMPHATICS: no LAN in the neck, no supraclavicular LAN RESPIRATORY: breathing is even & unlabored, BS CTAB CARDIAC: RRR, grade 3/6 sytolic with radiation & heard in all 4 quadrants murmur,no extra heart sounds, no edema GI:  ABDOMEN: abdomen soft, normal BS, no masses, no tenderness  LIVER/SPLEEN: no hepatomegaly, no splenomegaly MUSCULOSKELETAL: HEAD: normal to inspection & palpation BACK: no kyphosis, scoliosis or spinal processes tenderness EXTREMITIES: LEFT UPPER EXTREMITY: full range of motion, normal strength & tone RIGHT UPPER EXTREMITY:  full range of motion, normal strength & tone LEFT LOWER EXTREMITY:  moderate range of motion, normal strength & tone RIGHT LOWER EXTREMITY:  range of motion not tested due to surgery, normal strength & tone PSYCHIATRIC: the patient is alert & oriented to person, affect & behavior appropriate  LABS/RADIOLOGY:  Labs reviewed: Basic Metabolic Panel:  Recent Labs  09/08/13 0940 09/09/13 0244 09/10/13 0500  NA 138 137 138  K 4.3 3.7  3.7  CL 96 94* 96  CO2 31 29 29   GLUCOSE 104* 88 91  BUN 24* 23 19  CREATININE 0.60 0.58 0.48*  CALCIUM 8.6 8.9 8.7   Liver Function Tests:  Recent Labs  12/01/12 0355 09/03/13 1213  AST 15 27  ALT 12 12  ALKPHOS 54 63  BILITOT 0.8 0.5  PROT 7.5 8.0  ALBUMIN 3.5 3.7   CBC:  Recent Labs  06/08/13 1423 07/07/13 1548  09/03/13 1213   09/08/13 0940 09/09/13 0244 09/10/13 0500  WBC 5.4 4.8  < > 12.4*  < > 19.5* 17.2* 15.6*  NEUTROABS 2.1 1.7  --  10.3*  --   --   --   --   HGB 10.9* 10.7*  < > 10.4*  < > 8.3* 10.2* 9.9*  HCT 34.1* 33.7*  < > 32.0*  < > 25.8* 30.6* 30.0*  MCV 92.7 92.6  < > 92.2  < > 90.2 87.7 87.5  PLT 41* 39*  < > 59*  < > 39* 36* 45*  < > = values in this interval not displayed.  Cardiac Enzymes:  Recent Labs  09/05/13 1600 09/07/13 1655  TROPONINI <0.30 <0.30   CBG:  Recent Labs  09/05/13 0021  GLUCAP 128*    Transthoracic Echocardiography  Patient:    Ashley, Lutz MR #:       [id number] Study Date: 09/03/2013 Gender:     F Age:        28 Height:     167.6cm Weight:     50.3kg BSA:        1.28m^2 Pt. Status: Room:       [place], [place]  ADMITTING    Ashley Lutz  SONOGRAPHER  [address] [facility]  ORDERING     Ashley Lutz, [place]  REFERRING    Ashley Lutz, [facility]  PERFORMING   [facility], Inpatient cc:  ------------------------------------------------------------ LV EF: 60% -   65%  ------------------------------------------------------------ Indications:      Dyspnea 786.09.  ------------------------------------------------------------ History:   PMH:   Murmur.  Risk factors:  Hip fracture. Thrombocytopenia.  ------------------------------------------------------------ Study Conclusions  - Left ventricle: The cavity size was normal. Wall thickness   was increased in a pattern of moderate LVH. Systolic   function was normal. The estimated ejection fraction was   in the range of 60% to 65%. - Aortic valve: There was severe stenosis. Valve area:   0.74cm^2(VTI). Valve area: 0.67cm^2 (Vmax). - Mitral valve: Small diastolic gradient without significant   MS Severely calcified annulus. Valve area by continuity   equation (using LVOT flow): 1.88cm^2. - Left atrium: The atrium was moderately dilated. - Right atrium: The atrium was mildly dilated. - Atrial  septum: No defect or patent foramen ovale was   identified. - Pulmonary arteries: PA peak pressure: 32mm Hg (S). Transthoracic echocardiography.  M-mode, complete 2D, spectral Doppler, and color Doppler.  Height:  Height: 167.6cm. Height: 66in.  Weight:  Weight: 50.3kg. Weight: 110.8lb.  Body mass index:  BMI: 17.9kg/m^2.  Body surface area:    BSA: 1.41m^2.  Blood pressure:     151/71.  Patient status:  Inpatient.  Location:  ICU/CCU  ------------------------------------------------------------  ------------------------------------------------------------ Left ventricle:  The cavity size was normal. Wall thickness was increased in a pattern of moderate LVH. Systolic function was normal. The estimated ejection fraction was in the range of 60% to 65%.  ------------------------------------------------------------ Aortic valve:   Severely calcified leaflets.  Doppler: There  was severe stenosis.      Valve area: 0.74cm^2(VTI). Indexed valve area: 0.47cm^2/m^2 (VTI). Peak velocity ratio of LVOT to aortic valve: 0.21. Valve area: 0.67cm^2 (Vmax). Indexed valve area: 0.43cm^2/m^2 (Vmax).    Mean gradient: 71mm Hg (S). Peak gradient: 157mm Hg (S).  ------------------------------------------------------------ Mitral valve:  Small diastolic gradient without significant MS  Severely calcified annulus.  Doppler:   Trivial regurgitation.    Valve area by pressure half-time: 2.62cm^2. Indexed valve area by pressure half-time: 1.68cm^2/m^2. Valve area by continuity equation (using LVOT flow): 1.88cm^2. Indexed valve area by continuity equation (using LVOT flow): 1.21cm^2/m^2.    Mean gradient: 1mm Hg (D). Peak gradient: 52mm Hg (D).  ------------------------------------------------------------ Left atrium:  The atrium was moderately dilated.  ------------------------------------------------------------ Atrial septum:  No defect or patent foramen ovale  was identified.  ------------------------------------------------------------ Right ventricle:  The cavity size was normal. Wall thickness was normal. Systolic function was normal.  ------------------------------------------------------------ Pulmonic valve:    Structurally normal valve.   Cusp separation was normal.  Doppler:  Transvalvular velocity was within the normal range.  Trivial regurgitation.  ------------------------------------------------------------ Tricuspid valve:   Structurally normal valve.   Leaflet separation was normal.  Doppler:  Transvalvular velocity was within the normal range.  Mild regurgitation.  ------------------------------------------------------------ Right atrium:  The atrium was mildly dilated.  ------------------------------------------------------------ Pericardium:  The pericardium was normal in appearance.  ------------------------------------------------------------  2D measurements        Normal  Doppler measurements   Normal Left ventricle                 Main pulmonary LVID ED,   36.3 mm     43-52   artery chord,                         Pressure,    42 mm Hg  =30 PLAX                           S LVID ES,   22.7 mm     23-38   Pressure      9 mm Hg  ------ chord,                         ED PLAX                           LVOT FS, chord,   37 %      >29     Peak vel,   114 cm/s   ------ PLAX                           S LVPW, ED   10.9 mm     ------  Peak          5 mm Hg  ------ IVS/LVPW   1.36        <1.3    gradient, ratio, ED                      S Ventricular septum             Aortic valve IVS, ED    14.8 mm     ------  Peak vel,   532 cm/s   ------ LVOT  S Diam, S      20 mm     ------  Mean vel,   382 cm/s   ------ Area       3.14 cm^2   ------  S Aorta                          VTI, S      121 cm     ------ Root diam,   29 mm     ------  Mean         69 mm Hg  ------ ED                              gradient, Left atrium                    S AP dim       41 mm     ------  Peak        113 mm Hg  ------ AP dim     2.63 cm/m^2 <2.2    gradient, index                          S Vol, S     84.2 ml     ------  Area, VTI  0.74 cm^2   ------ Vol index,   54 ml/m^2 ------  Area index 0.47 cm^2/m ------ S                              (VTI)           ^2                                Peak vel   0.21        ------                                ratio,                                LVOT/AV                                Area, Vmax 0.67 cm^2   ------                                Area index 0.43 cm^2/m ------                                (Vmax)          ^2                                Mitral valve                                Peak E vel  181 cm/s   ------  Peak A vel  184 cm/s   ------                                Mean vel,   121 cm/s   ------                                D                                Decelerati  349 ms     150-23                                on time                0                                Pressure     84 ms     ------                                half-time                                Mean          7 mm Hg  ------                                gradient,                                D                                Peak         14 mm Hg  ------                                gradient,                                D                                Peak E/A      1        ------                                ratio                                Area (PHT) 2.62 cm^2   ------  Area index 1.68 cm^2/m ------                                (PHT)           ^2                                Area       1.88 cm^2   ------                                (LVOT)                                continuity                                Area index 1.21 cm^2/m ------                                 (LVOT           ^2                                cont)                                Annulus    49.6 cm     ------                                VTI                                Tricuspid valve                                Regurg      313 cm/s   ------                                peak vel                                Peak RV-RA   39 mm Hg  ------                                gradient,                                S                                Max regurg  313 cm/s   ------  vel                                Systemic veins                                Estimated     3 mm Hg  ------                                CVP                                Right ventricle                                Pressure,    42 mm Hg  <30                                S                                Sa vel,      18 cm/s   ------                                lat ann,                                tiss DP                                Pulmonic valve                                Regurg      119 cm/s   ------                                vel, ED Bilateral Lower Extremity Venous Duplex Evaluation  Patient:    Peityn, Medico MR #:       [id number] Study Date: 09/08/2013 Gender:     F Age:        48 Height: Weight: BSA: Pt. Status: Room:       3S03C    Linus Orn  SONOGRAPHER  Thereasa Parkin, RVT  ATTENDING    Marlin Canary Reports also to:  ------------------------------------------------------------ History and indications:  History  Diagnostic evaluation. Thrombocytopenia, leukocytosis. Status post fall with SDH and hip fracture.  ------------------------------------------------------------ Study information:  Study status:  Routine.  Procedure:  A vascular evaluation was performed with the patient in the supine position. The right common femoral, right femoral, right profunda femoral, right popliteal,  right peroneal, right posterior tibial, left common femoral, left femoral, left profunda femoral, left popliteal, left peroneal, and left posterior tibial veins were studied.    Bilateral lower extremity venous duplex evaluation.  Doppler flow study including B-mode compression maneuvers of all visualized segments, color flow Doppler and selected views of pulsed wave Doppler. Location:  SICU  Patient status:  Inpatient.  Venous flow:  +--------------------------+-------+----------------------+ Location                  OverallFlow properties        +--------------------------+-------+----------------------+ Right common femoral      Patent Phasic; spontaneous;                                    compressible           +--------------------------+-------+----------------------+ Right femoral             Patent Compressible           +--------------------------+-------+----------------------+ Right profunda femoral    Patent Compressible           +--------------------------+-------+----------------------+ Right popliteal           Patent Phasic; spontaneous;                                    compressible           +--------------------------+-------+----------------------+ Right posterior tibial    Patent Compressible           +--------------------------+-------+----------------------+ Right peroneal            Patent Compressible           +--------------------------+-------+----------------------+ Right saphenofemoral      Patent Compressible           junction                                                +--------------------------+-------+----------------------+ Left common femoral       Patent Phasic; spontaneous;                                    compressible           +--------------------------+-------+----------------------+ Left femoral              Patent Compressible            +--------------------------+-------+----------------------+ Left profunda femoral     Patent Compressible           +--------------------------+-------+----------------------+ Left popliteal            Patent Phasic; spontaneous;                                    compressible           +--------------------------+-------+----------------------+ Left posterior tibial     Patent Compressible           +--------------------------+-------+----------------------+ Left peroneal             Patent Compressible           +--------------------------+-------+----------------------+ Left saphenofemoral       Patent Compressible           junction                                                +--------------------------+-------+----------------------+  ------------------------------------------------------------  Summary:  - No evidence of deep vein thrombosis involving the right   lower extremity. - No evidence of deep vein thrombosis involving the left   lower extremity. Other specific details can be found in the table(s) above.    Prepared and Electronically Authenticated by  RIGHT HIP - COMPLETE 2+ VIEW   COMPARISON:  12/01/2012   FINDINGS: There is diffuse decreased bone mineralization. There are symmetric mild-to-moderate degenerative changes of the hips. There is a mildly displaced subcapital fracture of the right femoral neck. There are degenerative changes of the spine. Calcified plaque is present over the iliac and femoral arteries.   IMPRESSION: Displaced subcapital fracture of the right femoral neck.   CT HEAD WITHOUT CONTRAST   TECHNIQUE: Contiguous axial images were obtained from the base of the skull through the vertex without intravenous contrast.   COMPARISON:  Prior CT from 09/03/2013   FINDINGS: Study is somewhat degraded by motion artifact.   Layering hyperdensity along the left tentorium and posterior falx again seen,  compatible with acute subdural hematoma. Overall appearance is unchanged. Focal hyperdensity along the anterior falx is also stable. Again, there may be a small amount of associated subarachnoid hemorrhage within this region (series 2, image 20). No significant mass effect. Thin subdural overlying the right temporal lobe measures 3 mm in maximal diameter, unchanged. Subdural hemorrhage measuring up to 5 mm seen overlying the left occipital lobe (series 2, image 16), also stable. No new intracranial hemorrhage. Ventricles are stable in size without evidence of hydrocephalus. No intraventricular hemorrhage.   No mass lesion or midline shift. No acute large vessel territory infarct. Mild atrophy with chronic microvascular ischemic disease again noted.   Calvarium remains intact. Bilateral ocular lens implants noted. Orbits are otherwise unremarkable. Paranasal sinuses are clear. Partial opacification of the right mastoid air cells is stable from prior. Trace opacity noted within the left mastoid air cells as well.   Scalp soft tissues within normal limits.   IMPRESSION: 1. Stable subdural hemorrhage along the left tentorium and falx as above. Again, there may be a small amount of associated subarachnoid hemorrhage within the mesial left frontal lobe adjacent to the anterior falx. No significant mass effect. 2. Thin subdurals overlying the right temporal and left occipital lobes, stable. No significant mass effect. 3. No new intracranial hemorrhage or hydrocephalus.   PORTABLE PELVIS 1-2 VIEWS; PORTABLE RIGHT HIP - 1 VIEW   COMPARISON:  None available for comparison at time of study interpretation.   FINDINGS: Status post right hip hemiarthroplasty with intact well-seated femoral component, no fracture deformity. No dislocation. No destructive bony lesions, patient is osteopenic. Moderate to severe vascular calcifications. Phleboliths project in the pelvis. Severe degenerative  change of the included lumbar spine. Right hip subcutaneous gas consistent with recent surgery.   IMPRESSION: Status post right hip hemiarthroplasty with expected postop change.     PORTABLE PELVIS 1-2 VIEWS; PORTABLE RIGHT HIP - 1 VIEW   COMPARISON:  None available for comparison at time of study interpretation.   FINDINGS: Status post right hip hemiarthroplasty with intact well-seated femoral component, no fracture deformity. No dislocation. No destructive bony lesions, patient is osteopenic. Moderate to severe vascular calcifications. Phleboliths project in the pelvis. Severe degenerative change of the included lumbar spine. Right hip subcutaneous gas consistent with recent surgery.   IMPRESSION: Status post right hip hemiarthroplasty with expected postop change.     PORTABLE CHEST - 1 VIEW   COMPARISON:  DG CHEST 1 VIEW dated 09/03/2013; Darletta Moll  CHEST 2V dated 11/05/2011; DG RIBS UNILATERAL W/CHEST*L* dated 04/30/2007   FINDINGS: Grossly unchanged enlarged cardiac silhouette and mediastinal contours with atherosclerotic plaque within the thoracic aorta. Grossly unchanged left basilar/ retrocardiac heterogeneous opacities favored to represent atelectasis. There is unchanged diffuse nodular thickening of the pulmonary system. There is chronic mild elevation of the right hemidiaphragm. No pleural effusion pneumothorax. No evidence of edema. No acute osseus abnormalities.   IMPRESSION: 1. No acute cardiopulmonary disease. Specifically, no evidence of pneumonia. 2. Similar findings of left basilar atelectasis/scar and bronchitic change. RIGHT ANKLE - COMPLETE 3+ VIEW   COMPARISON:  None.   FINDINGS: Three views of right ankle submitted. No acute fracture or subluxation. There is diffuse osteopenia. Ankle mortise is preserved.   IMPRESSION: No acute fracture or subluxation.  Diffuse osteopenia.    ASSESSMENT/PLAN:  Right femur fx-s/p reapair.  Cont. Rehab. Severe  AS-not a surgical candidate Acute blood loss anemia-s/p transfusion.  Recheck. SDH-stable.  No focal neuro deficits. Myelodisplastic syndrome-rechck cbc w diff  I have reviewed patient's medical records received at admission/from hospitalization.  CPT CODE: 60454  Jazmon Kos Y Kimiya Brunelle, [place] [phone]

## 2013-09-14 NOTE — Progress Notes (Signed)
Received fax from "Call A Nurse" that [facility] L[facility] had called to report patient was sent to [facility] for rehab on 09/10/13 for an estimated 6 weeks s/p right hip surgery on 09/03/13.

## 2013-09-16 ENCOUNTER — Telehealth: Payer: Self-pay | Admitting: *Deleted

## 2013-09-16 NOTE — Telephone Encounter (Signed)
Received notification from C-A-N service. Pt's son called to report she came through hip surgery very well. Transferred to [facility] for rehab on 4/16 for an estimated 6 weeks. Per Dr. Benay Spice: Follow up as scheduled 5/11. Left message on voicemail for Chrissie Noa with reminder of 5/11 appt.

## 2013-09-18 ENCOUNTER — Non-Acute Institutional Stay (SKILLED_NURSING_FACILITY): Payer: Medicare HMO | Admitting: Adult Health

## 2013-09-18 DIAGNOSIS — N390 Urinary tract infection, site not specified: Secondary | ICD-10-CM

## 2013-09-22 ENCOUNTER — Encounter: Payer: Self-pay | Admitting: Adult Health

## 2013-09-22 DIAGNOSIS — N390 Urinary tract infection, site not specified: Secondary | ICD-10-CM | POA: Insufficient documentation

## 2013-09-22 NOTE — Progress Notes (Signed)
Patient ID: Caroleen Hamman, female   DOB: 08/27/1918, 80 y.o.   MRN: [id number]         PROGRESS NOTE  DATE: 09/18/2013  FACILITY:  [facility]  LEVEL OF CARE: SNF (31)  Acute Visit  CHIEF COMPLAINT:  Manage UTI  HISTORY OF PRESENT ILLNESS: This is a 80 year old female who complains of increase in urinary frequency and dysuria. Urine culture shows greater than 100,000 CFU/ml Proteus Mirabilis.  PAST MEDICAL HISTORY : Reviewed.  No changes/see problem list  CURRENT MEDICATIONS: Reviewed per MAR/see medication list  REVIEW OF SYSTEMS:  GENERAL: no change in appetite, no fatigue, no weight changes, no fever, chills or weakness RESPIRATORY: no cough, SOB, DOE,, wheezing, hemoptysis CARDIAC: no chest pain, or palpitations + edema GI: no abdominal pain, diarrhea, constipation, heart burn, nausea or vomiting GU: dysuria, increase in urinary frequency   PHYSICAL EXAMINATION GENERAL: no acute distress, normal body habitus MOUTH/THROAT: lips without lesions,no lesions in the mouth,tongue is without lesions,uvula elevates in midline NECK: supple, trachea midline, no neck masses, no thyroid tenderness, no thyromegaly LYMPHATICS: no LAN in the neck, no supraclavicular LAN RESPIRATORY: breathing is even & unlabored, BS CTAB CARDIAC: RRR, grade 3/6 sytolic with radiation & heard in all 4 quadrants murmur,no extra heart sounds, RLE edema 2+ ABDOMEN: abdomen soft, normal BS, no masses, no tenderness  LIVER/SPLEEN: no hepatomegaly, no splenomegaly MUSCULOSKELETAL: HEAD: normal to inspection & palpation BACK: no kyphosis, scoliosis or spinal processes tenderness EXTREMITIES: LEFT UPPER EXTREMITY: full range of motion, normal strength & tone RIGHT UPPER EXTREMITY:  full range of motion, normal strength & tone LEFT LOWER EXTREMITY:  moderate range of motion, normal strength & tone RIGHT LOWER EXTREMITY:  range of motion not tested due to surgery, normal strength &  tone PSYCHIATRIC: the patient is alert & oriented to person, affect & behavior appropriate   LABS/RADIOLOGY: Labs reviewed: Basic Metabolic Panel:  Recent Labs  09/08/13 0940 09/09/13 0244 09/10/13 0500  NA 138 137 138  K 4.3 3.7 3.7  CL 96 94* 96  CO2 31 29 29   GLUCOSE 104* 88 91  BUN 24* 23 19  CREATININE 0.60 0.58 0.48*  CALCIUM 8.6 8.9 8.7   Liver Function Tests:  Recent Labs  12/01/12 0355 09/03/13 1213  AST 15 27  ALT 12 12  ALKPHOS 54 63  BILITOT 0.8 0.5  PROT 7.5 8.0  ALBUMIN 3.5 3.7   CBC:  Recent Labs  06/08/13 1423 07/07/13 1548  09/03/13 1213  09/08/13 0940 09/09/13 0244 09/10/13 0500  WBC 5.4 4.8  < > 12.4*  < > 19.5* 17.2* 15.6*  NEUTROABS 2.1 1.7  --  10.3*  --   --   --   --   HGB 10.9* 10.7*  < > 10.4*  < > 8.3* 10.2* 9.9*  HCT 34.1* 33.7*  < > 32.0*  < > 25.8* 30.6* 30.0*  MCV 92.7 92.6  < > 92.2  < > 90.2 87.7 87.5  PLT 41* 39*  < > 59*  < > 39* 36* 45*  < > = values in this interval not displayed.  Cardiac Enzymes:  Recent Labs  09/05/13 1600 09/07/13 1655  TROPONINI <0.30 <0.30   CBG:  Recent Labs  09/05/13 0021  GLUCAP 128*    ASSESSMENT/PLAN:  UTI - start Bactrim DS 1 tab PO BID x 7 days  CPT CODE: 82993  Monina Vargas - NP [facility] [phone]

## 2013-09-25 ENCOUNTER — Encounter: Payer: Self-pay | Admitting: Adult Health

## 2013-09-25 ENCOUNTER — Non-Acute Institutional Stay (SKILLED_NURSING_FACILITY): Payer: Commercial Managed Care - HMO | Admitting: Adult Health

## 2013-09-25 DIAGNOSIS — S065X9A Traumatic subdural hemorrhage with loss of consciousness of unspecified duration, initial encounter: Secondary | ICD-10-CM

## 2013-09-25 DIAGNOSIS — S72009A Fracture of unspecified part of neck of unspecified femur, initial encounter for closed fracture: Secondary | ICD-10-CM

## 2013-09-25 DIAGNOSIS — S065XAA Traumatic subdural hemorrhage with loss of consciousness status unknown, initial encounter: Secondary | ICD-10-CM

## 2013-09-25 DIAGNOSIS — I350 Nonrheumatic aortic (valve) stenosis: Secondary | ICD-10-CM

## 2013-09-25 DIAGNOSIS — I6200 Nontraumatic subdural hemorrhage, unspecified: Secondary | ICD-10-CM

## 2013-09-25 DIAGNOSIS — S72001A Fracture of unspecified part of neck of right femur, initial encounter for closed fracture: Secondary | ICD-10-CM

## 2013-09-25 DIAGNOSIS — I10 Essential (primary) hypertension: Secondary | ICD-10-CM

## 2013-09-25 DIAGNOSIS — I359 Nonrheumatic aortic valve disorder, unspecified: Secondary | ICD-10-CM

## 2013-09-25 DIAGNOSIS — D62 Acute posthemorrhagic anemia: Secondary | ICD-10-CM

## 2013-09-25 DIAGNOSIS — E43 Unspecified severe protein-calorie malnutrition: Secondary | ICD-10-CM

## 2013-10-02 DIAGNOSIS — I359 Nonrheumatic aortic valve disorder, unspecified: Secondary | ICD-10-CM

## 2013-10-02 DIAGNOSIS — IMO0002 Reserved for concepts with insufficient information to code with codable children: Secondary | ICD-10-CM

## 2013-10-02 DIAGNOSIS — I10 Essential (primary) hypertension: Secondary | ICD-10-CM

## 2013-10-02 DIAGNOSIS — S72009D Fracture of unspecified part of neck of unspecified femur, subsequent encounter for closed fracture with routine healing: Secondary | ICD-10-CM

## 2013-10-02 DIAGNOSIS — D469 Myelodysplastic syndrome, unspecified: Secondary | ICD-10-CM

## 2013-10-05 ENCOUNTER — Other Ambulatory Visit (HOSPITAL_BASED_OUTPATIENT_CLINIC_OR_DEPARTMENT_OTHER): Payer: Commercial Managed Care - HMO

## 2013-10-05 ENCOUNTER — Ambulatory Visit (HOSPITAL_BASED_OUTPATIENT_CLINIC_OR_DEPARTMENT_OTHER): Payer: Commercial Managed Care - HMO | Admitting: Nurse Practitioner

## 2013-10-05 ENCOUNTER — Telehealth: Payer: Self-pay | Admitting: Oncology

## 2013-10-05 VITALS — BP 117/64 | HR 68 | Temp 97.0°F | Resp 20 | Ht 66.0 in | Wt 114.9 lb

## 2013-10-05 DIAGNOSIS — D649 Anemia, unspecified: Secondary | ICD-10-CM

## 2013-10-05 DIAGNOSIS — D709 Neutropenia, unspecified: Secondary | ICD-10-CM

## 2013-10-05 DIAGNOSIS — D469 Myelodysplastic syndrome, unspecified: Secondary | ICD-10-CM

## 2013-10-05 DIAGNOSIS — R609 Edema, unspecified: Secondary | ICD-10-CM

## 2013-10-05 DIAGNOSIS — D696 Thrombocytopenia, unspecified: Secondary | ICD-10-CM

## 2013-10-05 DIAGNOSIS — D72821 Monocytosis (symptomatic): Secondary | ICD-10-CM

## 2013-10-05 LAB — CBC WITH DIFFERENTIAL/PLATELET
BASO%: 0.3 % (ref 0.0–2.0)
Basophils Absolute: 0 10*3/uL (ref 0.0–0.1)
EOS ABS: 0 10*3/uL (ref 0.0–0.5)
EOS%: 0.3 % (ref 0.0–7.0)
HCT: 28.7 % — ABNORMAL LOW (ref 34.8–46.6)
HEMOGLOBIN: 8.9 g/dL — AB (ref 11.6–15.9)
LYMPH%: 11.6 % — AB (ref 14.0–49.7)
MCH: 28.5 pg (ref 25.1–34.0)
MCHC: 31 g/dL — ABNORMAL LOW (ref 31.5–36.0)
MCV: 92 fL (ref 79.5–101.0)
MONO#: 4.4 10*3/uL — AB (ref 0.1–0.9)
MONO%: 38.8 % — ABNORMAL HIGH (ref 0.0–14.0)
NEUT%: 49 % (ref 38.4–76.8)
NEUTROS ABS: 5.5 10*3/uL (ref 1.5–6.5)
RBC: 3.12 10*6/uL — AB (ref 3.70–5.45)
RDW: 20.7 % — ABNORMAL HIGH (ref 11.2–14.5)
WBC: 11.3 10*3/uL — ABNORMAL HIGH (ref 3.9–10.3)
lymph#: 1.3 10*3/uL (ref 0.9–3.3)
nRBC: 0 % (ref 0–0)

## 2013-10-05 LAB — TECHNOLOGIST REVIEW

## 2013-10-05 NOTE — Progress Notes (Signed)
  [place] OFFICE PROGRESS NOTE   Diagnosis:  Myelodysplasia.  INTERVAL HISTORY:   Ashley Lutz returns as scheduled. She was hospitalized 09/02/2013 through 09/10/2013 with a right femoral neck fracture and subdural hematoma following a fall. She underwent a right hip hemiarthroplasty on 09/04/2013. She received platelets and packed red blood cells during the hospitalization. A followup head CT showed stable subdural hemorrhage. No further workup or intervention per neurosurgery.  She was discharged to a nursing facility. She has since returned home. She continues occupational and physical therapy. No falls. She denies bleeding. She lost weight during the hospitalization and subsequent stay at a nursing facility. She has gained some weight since returning home. She is walking with a walker.  Objective:  Vital signs in last 24 hours:  Blood pressure 117/64, pulse 68, temperature 97 F (36.1 C), temperature source Oral, resp. rate 20, height 5\' 6"  (1.676 m), weight 114 lb 14.4 oz (52.118 kg), SpO2 97.00%.    HEENT: No thrush or ulcerations. Resp: Rhonchi left lung base. 8-7/5 systolic murmur. Cardio: Regular cardiac rhythm. GI: Abdomen soft and nontender. Vascular: Trace lower leg edema bilaterally right slightly greater than left. Calves soft and nontender.  MSK: Healed right hip incision.    Lab Results:  Lab Results  Component Value Date   WBC 11.3* 10/05/2013   HGB 8.9* 10/05/2013   HCT 28.7* 10/05/2013   MCV 92.0 10/05/2013   PLT 37 Giant platelets present* 10/05/2013   NEUTROABS 5.5 10/05/2013    Imaging:  No results found.  Medications: I have reviewed the patient's current medications.  Assessment/Plan: 1. Thrombocytopenia, question chronic ITP. The platelet count returned at 25,000 on 10/31/2012. Prednisone 40 mg daily initiated. Platelet count returned at 66,000 on 11/10/2012. The platelet count declined despite continuation of prednisone . Bone  marrow biopsy 01/05/2013 revealed a hypercellular marrow with dysplastic changes of the megakaryocytes and myeloid hyperplasia, normal cytogenetics. 2. History of small bilateral axillary lymph nodes.  3. Hypertension. 4. G2 P2. 5. History of a chronic mild monocytosis.  6. Weight loss. Question etiology. Weight is stable. 7. Evaluation emergency room with a fecal impaction and urinary retention 12/01/2012 8. CT abdomen 12/01/2012 revealed a 9 mm low-attenuation lesion at the junction of the body and tail of the pancreas with upstream pancreatic ductal dilatation. 9. Anemia/neutropenia-likely secondary to myelodysplasia. 10. Right femoral neck fracture status post right hip hemiarthroplasty 09/04/2013. 11. Small subdural hematoma 09/03/2013. Stable on CT 09/04/2013.   Disposition: Ashley Lutz appears stable from a hematologic standpoint. She will return for repeat labs and a followup visit in 3 months. She will contact the office in the interim with any problems. We specifically discussed bleeding.  She has trace lower leg edema bilaterally right slightly greater than left. Her son reports multiple venous Doppler studies negative for DVT with the most recent one being 2 weeks ago. They will call if she develops progressive swelling, redness or pain of the right leg.  Plan reviewed with Dr. Benay Spice.    Ashley Lutz ANP/GNP-BC   10/05/2013  3:30 PM

## 2013-10-05 NOTE — Telephone Encounter (Signed)
[facility] PT/RELATIVE APPT SCHEDULE FOR AUGUST. APPT SCHEDULED FOR 8/3 PER RELATIVE NEEDS MONDAYS LASTE PM.

## 2013-10-29 DIAGNOSIS — I10 Essential (primary) hypertension: Secondary | ICD-10-CM | POA: Insufficient documentation

## 2013-10-29 NOTE — Progress Notes (Signed)
Patient ID: Ashley Lutz, female   DOB: 1919-03-19, 80 y.o.   MRN: [id number]         PROGRESS NOTE  DATE:  09/25/13  FACILITY:  [facility]  LEVEL OF CARE: SNF (31)  Acute Visit  CHIEF COMPLAINT:  Discharge Notes  HISTORY OF PRESENT ILLNESS: This is a 80 year old female who is for discharge home with Home health PT, OT, Nursing and Home health Aide. DME: Rolling walker and Bedside commode. She has been admitted to [facility] on 09/10/13 from [facility] with principal diagnosis of Right hip fracture S/P Right hip arthroplasty. Patient was admitted to this facility for short-term rehabilitation after the patient's recent hospitalization.  Patient has completed SNF rehabilitation and therapy has cleared the patient for discharge.  Re-assessment of on-going problems:  ANEMIA: The anemia has been stable. The patient denies fatigue, melena or hematochezia. No complications from the medications currently being used. 4/15 hgb 8.8  CONSTIPATION: The constipation remains stable. No complications from the medications presently being used. Patient denies ongoing constipation, abdominal pain, nausea or vomiting.  HTN: Pt 's HTN remains stable.  Denies CP, sob, DOE, pedal edema, headaches, dizziness or visual disturbances.  Recently discontinued Norvasc and Altace. Currently on no medication.   Last BP :  108/61  PAST MEDICAL HISTORY : Reviewed.  No changes/see problem list  CURRENT MEDICATIONS: Reviewed per MAR/see medication list  REVIEW OF SYSTEMS:  GENERAL: no change in appetite, no fatigue, no weight changes, no fever, chills or weakness RESPIRATORY: no cough, SOB, DOE,, wheezing, hemoptysis CARDIAC: no chest pain, or palpitations + edema GI: no abdominal pain, diarrhea, constipation, heart burn, nausea or vomiting GU: dysuria, increase in urinary frequency   PHYSICAL EXAMINATION GENERAL: no acute distress, normal body habitus NECK: supple, trachea  midline, no neck masses, no thyroid tenderness, no thyromegaly LYMPHATICS: no LAN in the neck, no supraclavicular LAN RESPIRATORY: breathing is even & unlabored, BS CTAB CARDIAC: RRR, grade 3/6 sytolic with radiation & heard in all 4 quadrants murmur,no extra heart sounds, RLE edema 2+ ABDOMEN: abdomen soft, normal BS, no masses, no tenderness  LIVER/SPLEEN: no hepatomegaly, no splenomegaly MUSCULOSKELETAL:  HEAD: normal to inspection & palpation BACK: no kyphosis, scoliosis or spinal processes tenderness EXTREMITIES: able to move all 4 extremities PSYCHIATRIC: the patient is alert & oriented to person, affect & behavior appropriate   LABS/RADIOLOGY: 09/23/13  WBC 5.7 hemoglobin 8.8 hematocrit 30.5 09/15/13  WBC 12.3 hemoglobin 8.8 hematocrit 29.2 Labs reviewed: Basic Metabolic Panel:  Recent Labs  09/08/13 0940 09/09/13 0244 09/10/13 0500  NA 138 137 138  K 4.3 3.7 3.7  CL 96 94* 96  CO2 31 29 29   GLUCOSE 104* 88 91  BUN 24* 23 19  CREATININE 0.60 0.58 0.48*  CALCIUM 8.6 8.9 8.7   Liver Function Tests:  Recent Labs  12/01/12 0355 09/03/13 1213  AST 15 27  ALT 12 12  ALKPHOS 54 63  BILITOT 0.8 0.5  PROT 7.5 8.0  ALBUMIN 3.5 3.7   CBC:  Recent Labs  07/07/13 1548  09/03/13 1213  09/09/13 0244 09/10/13 0500 10/05/13 1329  WBC 4.8  < > 12.4*  < > 17.2* 15.6* 11.3*  NEUTROABS 1.7  --  10.3*  --   --   --  5.5  HGB 10.7*  < > 10.4*  < > 10.2* 9.9* 8.9*  HCT 33.7*  < > 32.0*  < > 30.6* 30.0* 28.7*  MCV 92.6  < >  92.2  < > 87.7 87.5 92.0  PLT 39*  < > 59*  < > 36* 45* 37 Giant platelets present*  < > = values in this interval not displayed.  Cardiac Enzymes:  Recent Labs  09/05/13 1600 09/07/13 1655  TROPONINI <0.30 <0.30   CBG:  Recent Labs  09/05/13 0021  GLUCAP 128*    ASSESSMENT/PLAN:  Right hip fracture status post right hip arthroplasty - for home health PT, OT, nursing and home health aide Anemia, acute blood loss - status post  transfusion of 4 units packed RBC; stable Aortic stenosis, severe - not a surgical candidate Protein calorie malnutrition, severe - continue supplementation Hypertension - well controlled, no medication Constipation - stable; continue Senokot   I have filled out patient's discharge paperwork and written prescriptions.  Patient will receive home health PT, OT, Nursing and CNA.  DME provided: Rolling walker and bedside commode  Total discharge time: Greater than 30 minutes Discharge time involved coordination of the discharge process with Education officer, museum, nursing staff and therapy department. Medical justification for home health services/DME verified.    CPT CODE: 89211   Seth Bake - NP [facility] [phone]

## 2013-12-28 ENCOUNTER — Other Ambulatory Visit (HOSPITAL_BASED_OUTPATIENT_CLINIC_OR_DEPARTMENT_OTHER): Payer: Commercial Managed Care - HMO

## 2013-12-28 ENCOUNTER — Ambulatory Visit: Payer: Commercial Managed Care - HMO | Admitting: Oncology

## 2013-12-28 ENCOUNTER — Telehealth: Payer: Self-pay | Admitting: Oncology

## 2013-12-28 VITALS — BP 123/63 | HR 97 | Temp 98.2°F | Resp 18 | Ht 66.0 in | Wt 107.5 lb

## 2013-12-28 DIAGNOSIS — D469 Myelodysplastic syndrome, unspecified: Secondary | ICD-10-CM

## 2013-12-28 DIAGNOSIS — D649 Anemia, unspecified: Secondary | ICD-10-CM

## 2013-12-28 LAB — CBC WITH DIFFERENTIAL/PLATELET
BASO%: 1 % (ref 0.0–2.0)
BASOS ABS: 0.1 10*3/uL (ref 0.0–0.1)
EOS ABS: 0.1 10*3/uL (ref 0.0–0.5)
EOS%: 0.9 % (ref 0.0–7.0)
HCT: 32.1 % — ABNORMAL LOW (ref 34.8–46.6)
HGB: 9.9 g/dL — ABNORMAL LOW (ref 11.6–15.9)
LYMPH#: 1.8 10*3/uL (ref 0.9–3.3)
LYMPH%: 22.6 % (ref 14.0–49.7)
MCH: 27.6 pg (ref 25.1–34.0)
MCHC: 30.8 g/dL — ABNORMAL LOW (ref 31.5–36.0)
MCV: 89.4 fL (ref 79.5–101.0)
MONO#: 2.1 10*3/uL — ABNORMAL HIGH (ref 0.1–0.9)
MONO%: 26.7 % — ABNORMAL HIGH (ref 0.0–14.0)
NEUT%: 48.8 % (ref 38.4–76.8)
NEUTROS ABS: 3.8 10*3/uL (ref 1.5–6.5)
Platelets: 47 10*3/uL — ABNORMAL LOW (ref 145–400)
RBC: 3.59 10*6/uL — ABNORMAL LOW (ref 3.70–5.45)
RDW: 18.3 % — AB (ref 11.2–14.5)
WBC: 7.8 10*3/uL (ref 3.9–10.3)
nRBC: 0 % (ref 0–0)

## 2013-12-28 NOTE — Progress Notes (Signed)
  [place] OFFICE PROGRESS NOTE   Diagnosis: Anemia/thrombocytopenia  INTERVAL HISTORY:   She returns as scheduled. No bleeding. Good appetite. She reports soreness at the iliac/gluteal area in the mornings that improves with ambulation.  Objective:  Vital signs in last 24 hours:  Blood pressure 123/63, pulse 97, temperature 98.2 F (36.8 C), temperature source Oral, resp. rate 18, height $RemoveBe'5\' 6"'cDPHOEVKq$  (1.676 m), weight 107 lb 8 oz (48.762 kg), SpO2 99.00%.    HEENT: No thrush or bleeding Resp: Lungs clear bilaterally Cardio: Regular rate and rhythm GI: No hepatosplenomegaly Vascular: No leg edema  Skin: No petechiae, minimal ecchymoses at the left lower leg     Lab Results:  Lab Results  Component Value Date   WBC 7.8 12/28/2013   HGB 9.9* 12/28/2013   HCT 32.1* 12/28/2013   MCV 89.4 12/28/2013   PLT 47* 12/28/2013   NEUTROABS 3.8 12/28/2013    Medications: I have reviewed the patient's current medications.  Assessment/Plan: 1. Thrombocytopenia, question chronic ITP. The platelet count returned at 25,000 on 10/31/2012. Prednisone 40 mg daily initiated. Platelet count returned at 66,000 on 11/10/2012. The platelet count declined despite continuation of prednisone . Bone marrow biopsy 01/05/2013 revealed a hypercellular marrow with dysplastic changes of the megakaryocytes and myeloid hyperplasia, normal cytogenetics. 2. History of small bilateral axillary lymph nodes.  3. Hypertension. 4. G2 P2. 5. History of a chronic mild monocytosis.  6. Weight loss. She reports a good appetite. 7. Evaluation emergency room with a fecal impaction and urinary retention 12/01/2012 8. CT abdomen 12/01/2012 revealed a 9 mm low-attenuation lesion at the junction of the body and tail of the pancreas with upstream pancreatic ductal dilatation. 9. Anemia/neutropenia-likely secondary to myelodysplasia. 10. Right femoral neck fracture status post right hip hemiarthroplasty  09/04/2013. 11. Small subdural hematoma 09/03/2013. Stable on CT 09/04/2013.  Disposition:  Ms. Milroy is stable from a hematologic standpoint. She will contact us for bleeding. She will return for an office and lab visit in approximately 3 months. I recommended she state up-to-date on the influenza and pneumococcal vaccines.  Betsy Coder, MD  12/28/2013  3:56 PM

## 2013-12-28 NOTE — Telephone Encounter (Signed)
gv and printed appt sched and avs for pt for OCT. °

## 2014-02-17 IMAGING — CT CT ABD-PELV W/ CM
1 of 4 series · 13 of 32 positions shown, 18 images · IV contrast (100 ML OMNI 300)
Comparison: Abdomen 12/01/2012

CLINICAL DATA: Left lower quadrant pain.  Excessive bowel
movements.  White cell count 14.7.  Red cells and white cells in
urine.

CT ABDOMEN AND PELVIS WITH CONTRAST
TECHNIQUE: Multidetector CT imaging of the abdomen and pelvis was
performed following the standard protocol during bolus
administration of intravenous contrast.
Contrast: 100mL OMNIPAQUE IOHEXOL 300 MG/ML  SOLN

[Series 2: abd/pel with · axial · 0.62mm/px · z∈[+1246,+1576]mm · 13 of 78 slices shown, 18 images]
[im 6/78  soft-tissue]
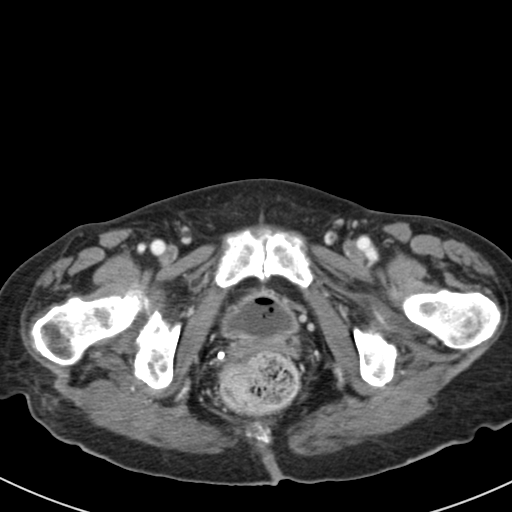
[im 6/78  bone]
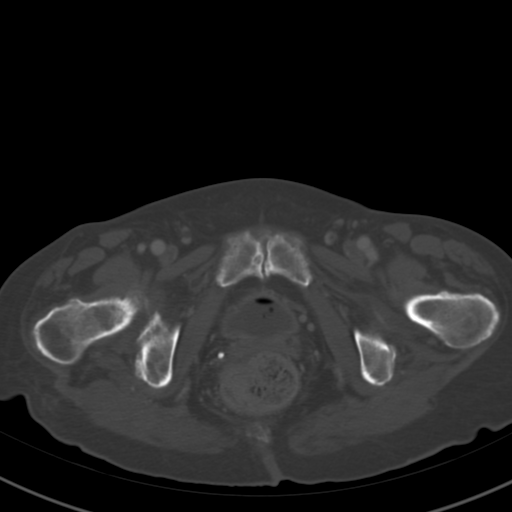
[im 11/78  soft-tissue]
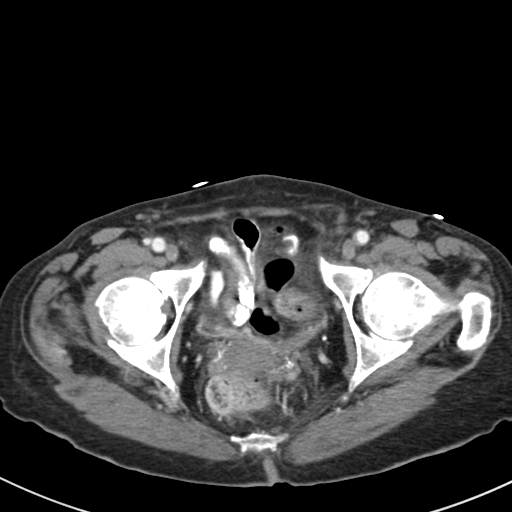
[im 16/78  soft-tissue]
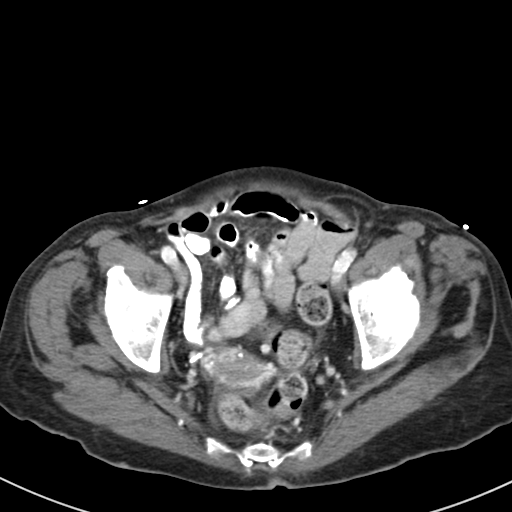
[im 26/78  soft-tissue]
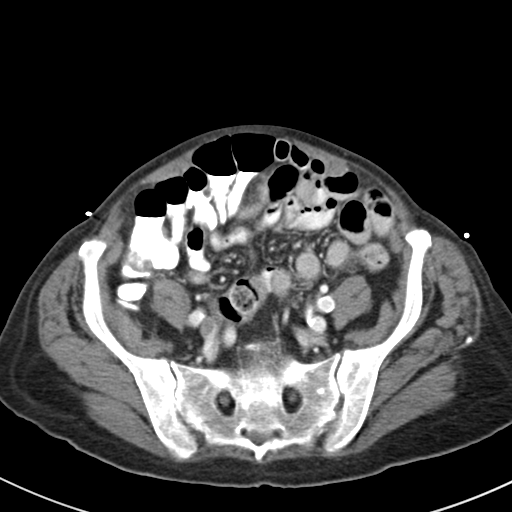
[im 31/78  soft-tissue]
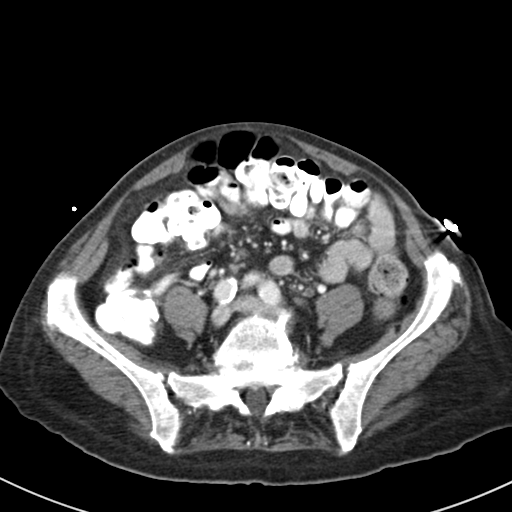
[im 36/78  soft-tissue]
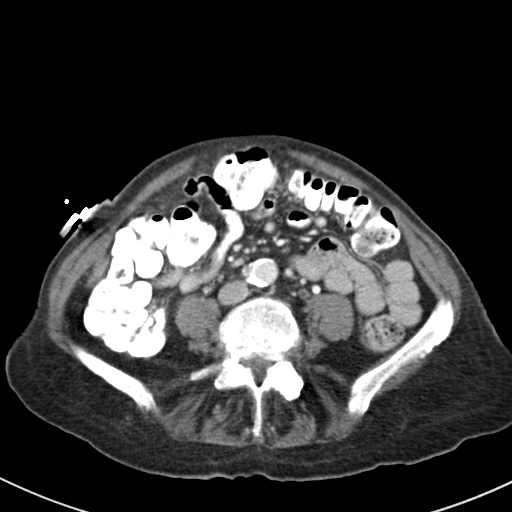
[im 42/78  soft-tissue]
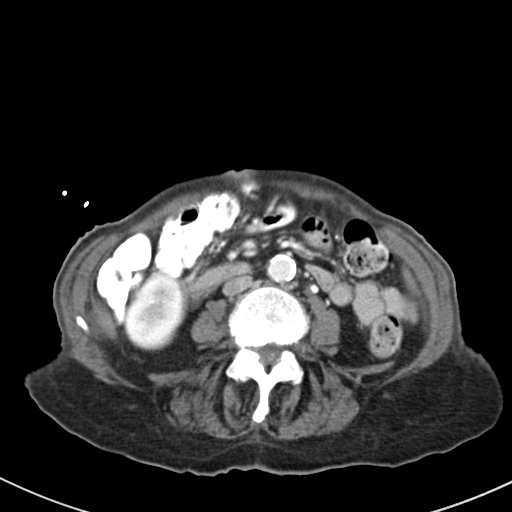
[im 47/78  soft-tissue]
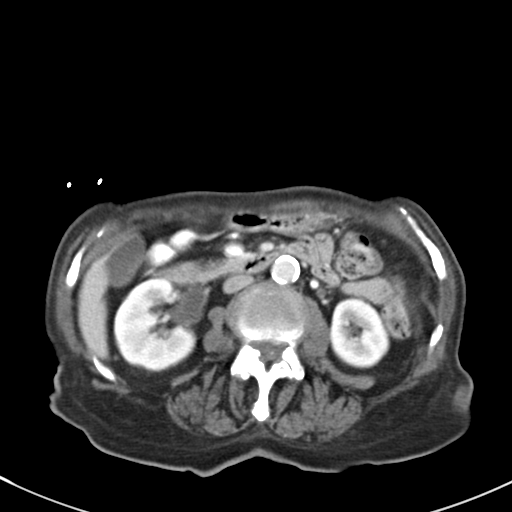
[im 52/78  soft-tissue]
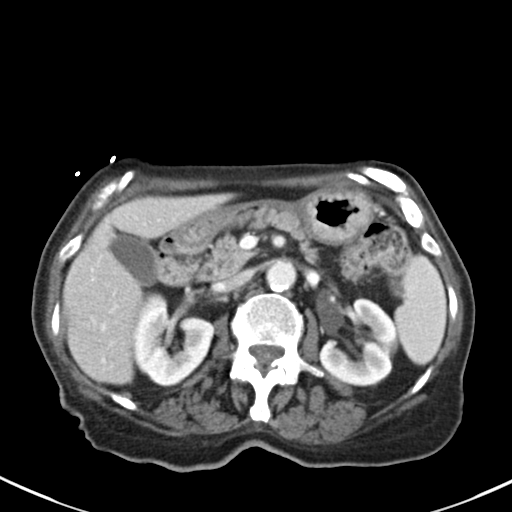
[im 52/78  bone]
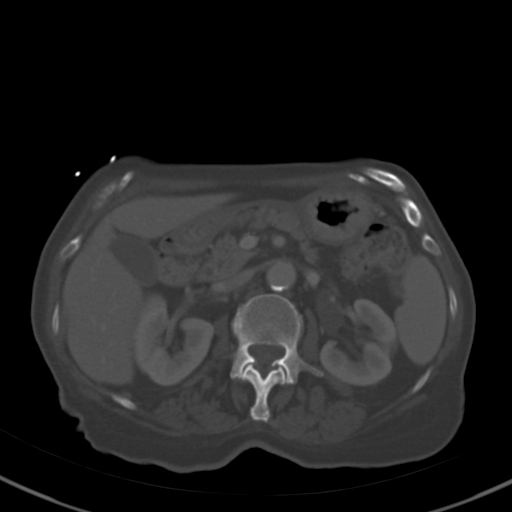
[im 57/78  lung]
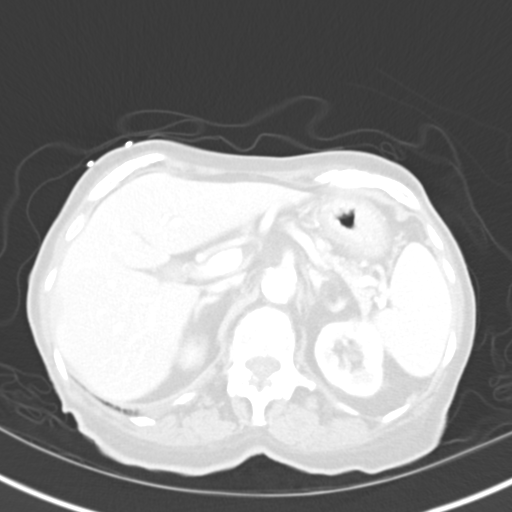
[im 62/78  soft-tissue]
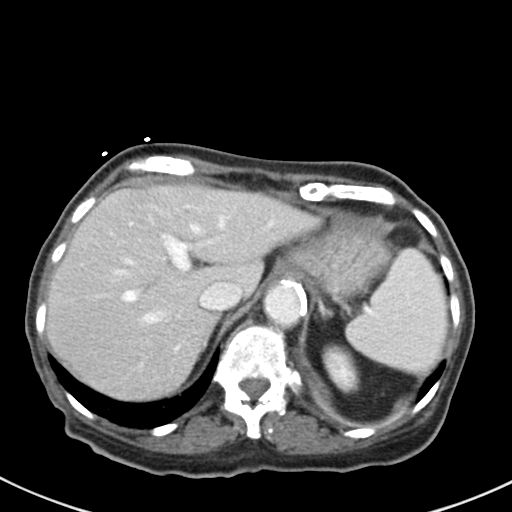
[im 62/78  lung]
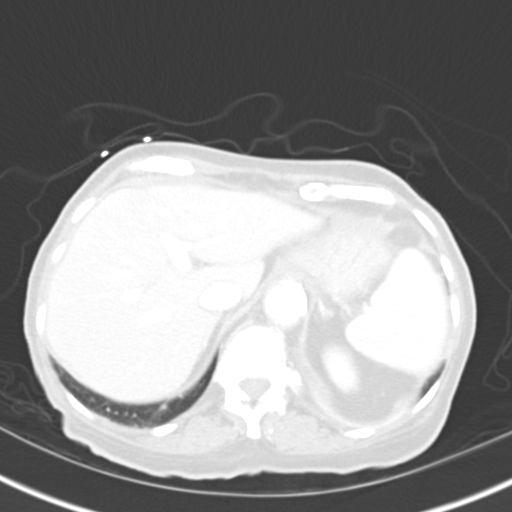
[im 67/78  soft-tissue]
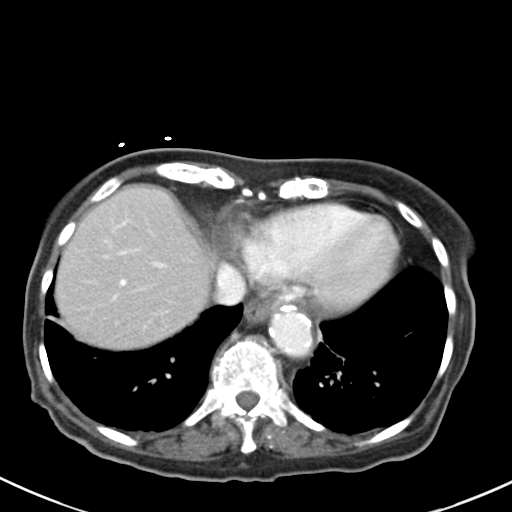
[im 67/78  lung]
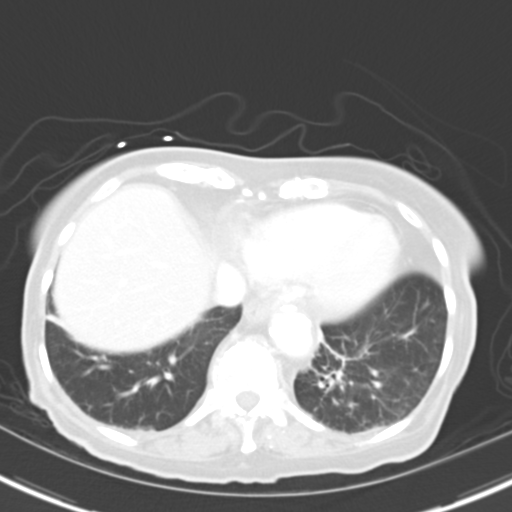
[im 72/78  soft-tissue]
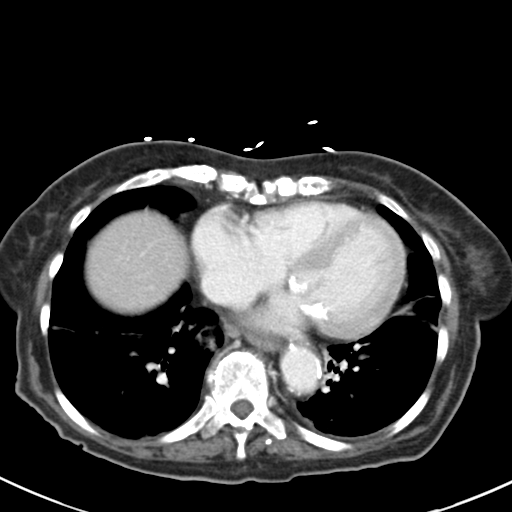
[im 72/78  lung]
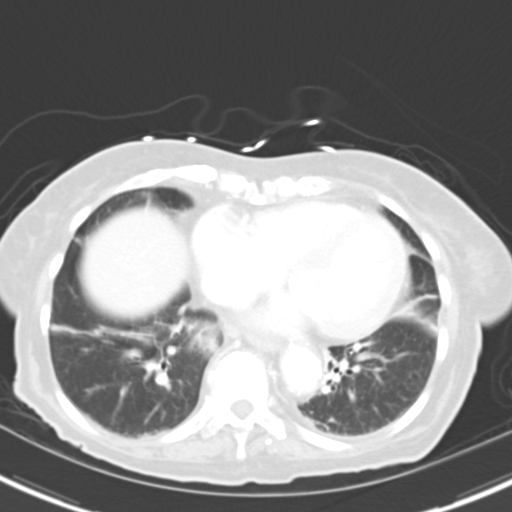

[13 of 32 positions shown; findings below may reference images not displayed]

FINDINGS: Fibrosis and atelectasis in the lung bases.
Calcification in the mitral and aortic valves with cardiac
enlargement, particularly of the left atrium.

The liver, spleen, gallbladder, adrenal glands, kidneys, inferior
vena cava, and retroperitoneal lymph nodes are unremarkable.
Calcification of the abdominal aorta without aneurysm.  The
pancreas demonstrates a small low attenuation lesion in the
junction of the body and tail measuring 9 mm diameter.  There
appears to be mild pancreatic ductal dilatation from the tail the
pancreas to the lesion. An obstructing intraductal lesion is not
excluded.  Consider further evaluation with MRCP.  The stomach and
small bowel are decompressed.  Stool filled colon without
distension.  No free air or free fluid in the abdomen.

Pelvis:  There is circumferential thickening of the wall of the
anorectal junction.  This could be due to inflammatory process or
neoplasm.  Direct visualization is recommended to exclude colon
cancer.  There is a small amount of free fluid in the pelvis.  The
bladder is decompressed with Foley catheter.  No evidence of
diverticulitis.  The appendix is not identified.  No significant
pelvic lymphadenopathy.  Degenerative changes in the lumbar spine.
IMPRESSION: Proctitis versus colon cancer causing colonic wall thickening of
the anorectal junction.  No proximal obstruction.  9 mm low
attenuation lesion at the junction of the body and tail the
pancreas with upstream pancreatic ductal dilatation.  Consider MRCP
to exclude early pancreatic obstructing lesion.

## 2014-03-11 ENCOUNTER — Inpatient Hospital Stay (HOSPITAL_COMMUNITY)
Admission: EM | Admit: 2014-03-11 | Discharge: 2014-03-19 | DRG: 291 | Disposition: A | Discharge status: Hospice | Payer: Medicare HMO | Attending: Internal Medicine | Admitting: Internal Medicine

## 2014-03-11 ENCOUNTER — Telehealth: Payer: Self-pay | Admitting: Nurse Practitioner

## 2014-03-11 ENCOUNTER — Encounter (HOSPITAL_COMMUNITY): Payer: Self-pay | Admitting: Emergency Medicine

## 2014-03-11 ENCOUNTER — Emergency Department (HOSPITAL_COMMUNITY): Payer: Medicare HMO

## 2014-03-11 DIAGNOSIS — D696 Thrombocytopenia, unspecified: Secondary | ICD-10-CM | POA: Diagnosis present

## 2014-03-11 DIAGNOSIS — E43 Unspecified severe protein-calorie malnutrition: Secondary | ICD-10-CM | POA: Diagnosis present

## 2014-03-11 DIAGNOSIS — J9601 Acute respiratory failure with hypoxia: Secondary | ICD-10-CM | POA: Diagnosis not present

## 2014-03-11 DIAGNOSIS — R740 Nonspecific elevation of levels of transaminase and lactic acid dehydrogenase [LDH]: Secondary | ICD-10-CM | POA: Diagnosis not present

## 2014-03-11 DIAGNOSIS — Z96641 Presence of right artificial hip joint: Secondary | ICD-10-CM | POA: Diagnosis not present

## 2014-03-11 DIAGNOSIS — Z681 Body mass index (BMI) 19 or less, adult: Secondary | ICD-10-CM | POA: Diagnosis not present

## 2014-03-11 DIAGNOSIS — I4891 Unspecified atrial fibrillation: Secondary | ICD-10-CM | POA: Diagnosis not present

## 2014-03-11 DIAGNOSIS — J9690 Respiratory failure, unspecified, unspecified whether with hypoxia or hypercapnia: Secondary | ICD-10-CM | POA: Diagnosis present

## 2014-03-11 DIAGNOSIS — Z515 Encounter for palliative care: Secondary | ICD-10-CM

## 2014-03-11 DIAGNOSIS — I5033 Acute on chronic diastolic (congestive) heart failure: Secondary | ICD-10-CM | POA: Diagnosis present

## 2014-03-11 DIAGNOSIS — Z66 Do not resuscitate: Secondary | ICD-10-CM | POA: Diagnosis not present

## 2014-03-11 DIAGNOSIS — I350 Nonrheumatic aortic (valve) stenosis: Secondary | ICD-10-CM | POA: Diagnosis not present

## 2014-03-11 DIAGNOSIS — J811 Chronic pulmonary edema: Secondary | ICD-10-CM | POA: Diagnosis present

## 2014-03-11 DIAGNOSIS — R627 Adult failure to thrive: Secondary | ICD-10-CM | POA: Diagnosis present

## 2014-03-11 DIAGNOSIS — A419 Sepsis, unspecified organism: Secondary | ICD-10-CM

## 2014-03-11 DIAGNOSIS — H9190 Unspecified hearing loss, unspecified ear: Secondary | ICD-10-CM | POA: Diagnosis not present

## 2014-03-11 DIAGNOSIS — I10 Essential (primary) hypertension: Secondary | ICD-10-CM

## 2014-03-11 DIAGNOSIS — J810 Acute pulmonary edema: Secondary | ICD-10-CM

## 2014-03-11 DIAGNOSIS — R0600 Dyspnea, unspecified: Secondary | ICD-10-CM | POA: Diagnosis present

## 2014-03-11 DIAGNOSIS — J189 Pneumonia, unspecified organism: Secondary | ICD-10-CM

## 2014-03-11 DIAGNOSIS — R0602 Shortness of breath: Secondary | ICD-10-CM

## 2014-03-11 DIAGNOSIS — D7289 Other specified disorders of white blood cells: Secondary | ICD-10-CM | POA: Diagnosis not present

## 2014-03-11 DIAGNOSIS — I959 Hypotension, unspecified: Secondary | ICD-10-CM

## 2014-03-11 LAB — I-STAT ARTERIAL BLOOD GAS, ED
Bicarbonate: 22.6 mEq/L (ref 20.0–24.0)
O2 SAT: 99 %
PCO2 ART: 29.8 mmHg — AB (ref 35.0–45.0)
PO2 ART: 133 mmHg — AB (ref 80.0–100.0)
Patient temperature: 98.2
TCO2: 24 mmol/L (ref 0–100)
pH, Arterial: 7.488 — ABNORMAL HIGH (ref 7.350–7.450)

## 2014-03-11 LAB — CBC WITH DIFFERENTIAL/PLATELET
BASOS PCT: 0 % (ref 0–1)
Basophils Absolute: 0 10*3/uL (ref 0.0–0.1)
EOS PCT: 0 % (ref 0–5)
Eosinophils Absolute: 0 10*3/uL (ref 0.0–0.7)
HEMATOCRIT: 35.8 % — AB (ref 36.0–46.0)
HEMOGLOBIN: 10.9 g/dL — AB (ref 12.0–15.0)
LYMPHS ABS: 1.1 10*3/uL (ref 0.7–4.0)
Lymphocytes Relative: 7 % — ABNORMAL LOW (ref 12–46)
MCH: 27.5 pg (ref 26.0–34.0)
MCHC: 30.4 g/dL (ref 30.0–36.0)
MCV: 90.2 fL (ref 78.0–100.0)
MONOS PCT: 15 % — AB (ref 3–12)
Monocytes Absolute: 2.4 10*3/uL — ABNORMAL HIGH (ref 0.1–1.0)
Neutro Abs: 12.3 10*3/uL — ABNORMAL HIGH (ref 1.7–7.7)
Neutrophils Relative %: 78 % — ABNORMAL HIGH (ref 43–77)
Platelets: 41 10*3/uL — ABNORMAL LOW (ref 150–400)
RBC: 3.97 MIL/uL (ref 3.87–5.11)
RDW: 25.5 % — ABNORMAL HIGH (ref 11.5–15.5)
WBC: 15.8 10*3/uL — AB (ref 4.0–10.5)

## 2014-03-11 LAB — COMPREHENSIVE METABOLIC PANEL
ALT: 96 U/L — ABNORMAL HIGH (ref 0–35)
AST: 112 U/L — AB (ref 0–37)
Albumin: 3.1 g/dL — ABNORMAL LOW (ref 3.5–5.2)
Alkaline Phosphatase: 66 U/L (ref 39–117)
Anion gap: 18 — ABNORMAL HIGH (ref 5–15)
BILIRUBIN TOTAL: 1.5 mg/dL — AB (ref 0.3–1.2)
BUN: 34 mg/dL — AB (ref 6–23)
CHLORIDE: 105 meq/L (ref 96–112)
CO2: 20 meq/L (ref 19–32)
CREATININE: 0.89 mg/dL (ref 0.50–1.10)
Calcium: 9.3 mg/dL (ref 8.4–10.5)
GFR calc Af Amer: 63 mL/min — ABNORMAL LOW (ref >90)
GFR, EST NON AFRICAN AMERICAN: 54 mL/min — AB (ref >90)
GLUCOSE: 110 mg/dL — AB (ref 70–99)
Potassium: 5.3 mEq/L (ref 3.7–5.3)
Sodium: 143 mEq/L (ref 137–147)
Total Protein: 8.8 g/dL — ABNORMAL HIGH (ref 6.0–8.3)

## 2014-03-11 LAB — PRO B NATRIURETIC PEPTIDE: Pro B Natriuretic peptide (BNP): 10096 pg/mL — ABNORMAL HIGH (ref 0–450)

## 2014-03-11 LAB — I-STAT CG4 LACTIC ACID, ED: LACTIC ACID, VENOUS: 4.99 mmol/L — AB (ref 0.5–2.2)

## 2014-03-11 LAB — I-STAT TROPONIN, ED: Troponin i, poc: 0.01 ng/mL (ref 0.00–0.08)

## 2014-03-11 MED ORDER — DEXTROSE 5 % IV SOLN
1.0000 g | INTRAVENOUS | Status: DC
  Administered 2014-03-11 – 2014-03-15 (×4): 1 g via INTRAVENOUS
  Filled 2014-03-11 (×5): qty 1

## 2014-03-11 MED ORDER — VANCOMYCIN HCL 500 MG IV SOLR
500.0000 mg | INTRAVENOUS | Status: DC
  Administered 2014-03-11: 500 mg via INTRAVENOUS
  Filled 2014-03-11 (×2): qty 500

## 2014-03-11 MED ORDER — SODIUM CHLORIDE 0.9 % IV BOLUS (SEPSIS): 1000.0000 mL | Freq: Once | INTRAVENOUS | Status: DC

## 2014-03-11 NOTE — Progress Notes (Signed)
ANTIBIOTIC CONSULT NOTE - INITIAL  Pharmacy Consult for Cefepime/Vancomycin Indication: HCAP  Allergies  Allergen Reactions  . Milk-Related Compounds Other (See Comments)    Lactose intolerant    Patient Measurements: Height: 5\' 5"  (165.1 cm) Weight: 107 lb (48.535 kg) IBW/kg (Calculated) : 57  Vital Signs: Temp: 98.2 F (36.8 C) (10/15 1802) Temp Source: Temporal (10/15 1802) BP: 103/86 mmHg (10/15 2030) Pulse Rate: 90 (10/15 2105)  Labs:  Recent Labs  03/11/14 1832  WBC 15.8*  HGB 10.9*  PLT 41*  CREATININE 0.89   Estimated Creatinine Clearance: 30.2 ml/min (by C-G formula based on Cr of 0.89). No results found for this basename: VANCOTROUGH, VANCOPEAK, VANCORANDOM, GENTTROUGH, GENTPEAK, GENTRANDOM, TOBRATROUGH, TOBRAPEAK, TOBRARND, AMIKACINPEAK, AMIKACINTROU, AMIKACIN,  in the last 72 hours   Microbiology: No results found for this or any previous visit (from the past 720 hour(s)).  Medical History: Past Medical History  Diagnosis Date  . Thrombocytopenia 2010  . Hypertension   . Heart murmur   . Wrist fracture, left   . Chronic idiopathic monocytosis   . Enlargement of lymph nodes     H/O small bilateral axillary lymph nodes  . Shortness of breath     with excertion   Assessment: 80 y/o F w/ SOB initially satting 59% on room air, improved with CPAP.  Afebrile, BP 110/54, WBC 15.8, lactic acid 4.99. CrCl 30.2 mL/min. Empiric antibiotics for potential pneumonia.   Goal of Therapy:  Vancomycin trough 15-20  Plan:  Cefepime IV 1 gram q24h Vancomycin IV 500 mg q24h F/u vancomycin trough, renal function, cultures and suseptibilities   Carl Best 03/11/2014,9:20 PM

## 2014-03-11 NOTE — ED Notes (Signed)
NOTIFIED DR .Christy Gentles FOR PATIENTS PANIC LAB RESULTS OF CG4+ LACTIC ACID= 4.20mmol/L, @18 ;50 PM ,03/11/2014.

## 2014-03-11 NOTE — ED Notes (Signed)
RT at the bedside for ABG.  

## 2014-03-11 NOTE — Progress Notes (Signed)
I agree with the assessment and plan.  Salome Arnt, PharmD, BCPS Pager # [phone] 03/11/2014 9:32 PM

## 2014-03-11 NOTE — ED Notes (Signed)
Pt placed into gown and on monitor upon arrival to room. Pt monitored by blood pressure, pulse ox, and 12 lead. Pts EKG given to and signed by Dr. Christy Gentles.

## 2014-03-11 NOTE — ED Provider Notes (Signed)
Patient seen/examined in the Emergency Department in conjunction with Resident Physician Provider [facility] Patient reports shortness of breath Exam : pt is awake/alert,tachypneic, hypoxic, currently on bipap Plan: will need admission, will follow closely    Sharyon Cable, MD 03/11/14 1755

## 2014-03-11 NOTE — Telephone Encounter (Signed)
I contacted Ashley Lutz's son Ashley Lutz regarding his earlier call to our scheduler. He said for the past 2 days she has been weak and will not eat. She has had no fever or chills. Her temperature has been "low". She's had no confusion. He notes she is short of breath. He does not think she has had any bleeding. I recommended he take her to the emergency Department for evaluation. He does not feel this is necessary. I recommended he contact her primary Dr. as well. He states he called their office earlier and they have no openings until next week.  I discussed the above with Dr. Benay Spice. He as well recommends she go to the emergency Department for evaluation.  I let Ashley Lutz know that Dr. Benay Spice also recommends evaluation in the emergency Department. He understands that she could be bleeding or have an infection. He is agreeable to go to the emergency room and will make arrangements to take her there now.

## 2014-03-11 NOTE — ED Notes (Signed)
Dr. Wickline at the bedside.  

## 2014-03-11 NOTE — H&P (Addendum)
[facility]                                                                                    Patient Demographics  Ashley Lutz, is a 80 y.o. female  CSN: [id number]  MRN: [id number]  DOB - 02-09-1919  Admit Date - 03/11/2014  Outpatient Primary MD for the patient is Mathews Argyle, MD   With History of -  Past Medical History  Diagnosis Date  . Thrombocytopenia 2010  . Hypertension   . Heart murmur   . Wrist fracture, left   . Chronic idiopathic monocytosis   . Enlargement of lymph nodes     H/O small bilateral axillary lymph nodes  . Shortness of breath     with excertion      Past Surgical History  Procedure Laterality Date  . Hemorrhoid surgery    . Bunionectomy    . Soft tissue cyst excision      X 5 occasions--cysts on scalp  . Hip arthroplasty Right 09/04/2013    Procedure: ARTHROPLASTY BIPOLAR HIP;  Surgeon: [name], MD;  Location: [facility];  Service: Orthopedics;  Laterality: Right;    in for   Chief Complaint  Patient presents with  . Respiratory Distress     HPI  Ashley Lutz  is a 80 y.o. female, with past medical history significant for thrombocytopenia, hypertension and aortic stenosis, sent today from assisted living facility with increasing shortness of breath for the last few days. Patient denies any chest pains, nausea or vomiting. In the emergency room the patient was found to have congestive heart failure and bilateral basal pneumonia and a blood pressure was on the low side . No history of fever or chills. Patient's son was in the room and gave most of the history and reported that his mother's wishes are DO NOT RESUSCITATE. The patient was placed on CPAP and felt better .    Review of Systems    In addition to the HPI above,  No Fever-chills, No Headache, No changes with Vision or hearing, No problems swallowing food or Liquids, No Chest pain,  No Abdominal pain, No Nausea or Vommitting, Bowel movements  are regular, No Blood in stool or Urine, No dysuria, No new skin rashes or bruises, No new joints pains-aches,  No new weakness, tingling, numbness in any extremity, No recent weight gain or loss, No polyuria, polydypsia or polyphagia, No significant Mental Stressors.  A full 10 point Review of Systems was done, except as stated above, all other Review of Systems were negative.   Social History History  Substance Use Topics  . Smoking status: Never Smoker   . Smokeless tobacco: Not on file  . Alcohol Use: No     Family History Significant for cancer  Prior to Admission medications   Medication Sig Start Date End Date Taking? Authorizing Provider  acetaminophen (TYLENOL) 325 MG tablet Take 2 tablets (650 mg total) by mouth every 6 (six) hours as needed for mild pain (or Fever >/= 101). 09/10/13   Geradine Girt, DO  amLODipine (NORVASC) 5 MG tablet  08/31/13   Historical Provider, MD  bisacodyl (DULCOLAX)  10 MG suppository Place 1 suppository (10 mg total) rectally daily as needed for moderate constipation. 09/10/13   Geradine Girt, DO  calcium-vitamin D (OSCAL WITH D) 250-125 MG-UNIT per tablet Take 1 tablet by mouth daily.    Historical Provider, MD  feeding supplement, ENSURE, (ENSURE) PUDG Take 1 Container by mouth 2 (two) times daily between meals. 09/10/13   Geradine Girt, DO  menthol-cetylpyridinium (CEPACOL) 3 MG lozenge Take 1 lozenge (3 mg total) by mouth as needed for sore throat (sore throat). 09/10/13   Geradine Girt, DO  Multiple Vitamins-Minerals (CENTRUM SILVER PO) Take 1 tablet by mouth daily.    Historical Provider, MD  phenol (CHLORASEPTIC) 1.4 % LIQD Use as directed 1 spray in the mouth or throat as needed for throat irritation / pain. 09/10/13   Geradine Girt, DO  ramipril (ALTACE) 10 MG capsule  08/31/13   Historical Provider, MD  sennosides-docusate sodium (SENOKOT-S) 8.6-50 MG tablet Take 2 tablets by mouth daily. 09/04/13   [name], MD    Allergies   Allergen Reactions  . Milk-Related Compounds Other (See Comments)    Lactose intolerant    Physical Exam  Vitals  Blood pressure 110/54, pulse 100, temperature 98.2 F (36.8 C), temperature source Temporal, resp. rate 22, height 5\' 5"  (1.651 m), weight 48.535 kg (107 lb), SpO2 94.00%.   1. General elderly female, looks acutely sick and short of breath, using accessory muscles of breathing and sitting up in bed  2. Normal affect and insight, Not Suicidal or Homicidal, Awake Alert, Oriented X 3.  3. No F.N deficits grossly, ALL C.Nerves Intact,   4. Ears and Eyes appear Normal, Conjunctivae clear, PERRLA. Moist Oral Mucosa.  5. Supple Neck,Mild JVD, No cervical lymphadenopathy appriciated,   6. Symmetrical Chest wall movement, decreased breath sounds at the bases  7. irregularly irregular, systolic ejection murmur 4/6, No Parasternal Heave.  8. Positive Bowel Sounds, Abdomen Soft, Non tender, No organomegaly appriciated,No rebound -guarding or rigidity.  9.  No Cyanosis, Normal Skin Turgor, No Skin Rash or Bruise.  10. Extremities : no clubbing or cyanosis +3 edema  11. No Palpable Lymph Nodes in Neck or Axillae    Data Review  CBC  Recent Labs Lab 03/11/14 1832  WBC 15.8*  HGB 10.9*  HCT 35.8*  PLT 41*  MCV 90.2  MCH 27.5  MCHC 30.4  RDW 25.5*  LYMPHSABS 1.1  MONOABS 2.4*  EOSABS 0.0  BASOSABS 0.0   ------------------------------------------------------------------------------------------------------------------  Chemistries   Recent Labs Lab 03/11/14 1832  NA 143  K 5.3  CL 105  CO2 20  GLUCOSE 110*  BUN 34*  CREATININE 0.89  CALCIUM 9.3  AST 112*  ALT 96*  ALKPHOS 66  BILITOT 1.5*   ------------------------------------------------------------------------------------------------------------------ estimated creatinine clearance is 30.2 ml/min (by C-G formula based on Cr of  0.89). ------------------------------------------------------------------------------------------------------------------ No results found for this basename: TSH, T4TOTAL, FREET3, T3FREE, THYROIDAB,  in the last 72 hours   Coagulation profile No results found for this basename: INR, PROTIME,  in the last 168 hours ------------------------------------------------------------------------------------------------------------------- No results found for this basename: DDIMER,  in the last 72 hours -------------------------------------------------------------------------------------------------------------------  Cardiac Enzymes No results found for this basename: CK, CKMB, TROPONINI, MYOGLOBIN,  in the last 168 hours ------------------------------------------------------------------------------------------------------------------ No components found with this basename: POCBNP,    ---------------------------------------------------------------------------------------------------------------  Urinalysis    Component Value Date/Time   COLORURINE YELLOW 09/06/2013 [place] 09/06/2013 0835   LABSPEC 1.018 09/06/2013 1610  PHURINE 6.0 09/06/2013 0835   GLUCOSEU NEGATIVE 09/06/2013 0835   HGBUR TRACE* 09/06/2013 0835   BILIRUBINUR NEGATIVE 09/06/2013 0835   KETONESUR NEGATIVE 09/06/2013 0835   PROTEINUR NEGATIVE 09/06/2013 0835   UROBILINOGEN 0.2 09/06/2013 0835   NITRITE NEGATIVE 09/06/2013 0835   LEUKOCYTESUR NEGATIVE 09/06/2013 0835    ----------------------------------------------------------------------------------------------------------------   Imaging results:   Dg Chest Portable 1 View  03/11/2014   CLINICAL DATA:  Respiratory distress for 1 day.  Initial encounter.  EXAM: PORTABLE CHEST - 1 VIEW  COMPARISON:  09/06/2013; 09/03/2013; 11/05/2011  FINDINGS: Grossly unchanged enlarged cardiac silhouette and mediastinal contours with atherosclerotic plaque within the  thoracic aorta. The pulmonary vasculature is indistinct with cephalization of flow. Interval development small bilateral effusions and associated bibasilar heterogeneous/ consolidative opacities, left greater than right. No pneumothorax. Unchanged bones.  IMPRESSION: Cardiomegaly and atherosclerosis with findings of pulmonary edema and small bilateral effusions with associated bibasilar opacities, left greater than right, atelectasis versus infiltrate.   Electronically Signed   By: Sandi Mariscal M.D.   On: 03/11/2014 19:26    My personal review of EKG: Atrial fib at 111 beats per minute with nonspecific T-wave changes  Assessment & Plan  1. Sepsis      Elevated lactic acid      IV vancomycin and cefepime      Blood cultures ordered  2. Congestive heart failure/diastolic with history of aortic valve stenosis( last echo shows valvular area of 0.67 cm  )          Pulmonary edema     Lasix if blood pressure can hold     Fluid restriction     Beta blockers when her blood pressure can hold     Decrease Altace to 2.5     Cannot drop her blood pressure too low because of her aortic stenosis and fear of decreased perfusion  3. Atrial fibrillation, with rapid ventricular rate     Probably worse due to hypoxemia     Lopressor low dose for rate control  4. Bilateral pneumonia     Nebulizer treatments     IV vancomycin and cefepime     Check antigens     Check cultures   DVT Prophylaxis SCDs  AM Labs Ordered, also please review Full Orders  Family Communication: Admission, patients condition and plan of care including tests being ordered have been discussed with the patient and son who indicate understanding and agree with the plan and Code Status.  Code Status DO NOT RESUSCITATE  Disposition Plan: Unknown  Time spent in minutes : 42 minutes  Condition critical   @SIGNATURE @

## 2014-03-11 NOTE — Telephone Encounter (Signed)
Pt's son Yvone Neu called to r/s 10/19 to earlier states pt can't get in to see PCP and states she won't get out of bed, does not want to do anything, needs to get pt to see MD/ML to see what is going on...Marland KitchenMarland KitchenMarland Kitchen KJ

## 2014-03-11 NOTE — ED Notes (Signed)
Per [facility], pt from home for SOB today, room air sats 59%, placed on NRB and sats came up to 80 %. Placed on CPAP once on EMS stretcher and sats increased to 91%. Pt denies any pain only has SOB, pt had no improvement after being placed on CPAP. Unable to get IV access. Bilateral pitting edema. Rales bilaterally

## 2014-03-11 NOTE — ED Provider Notes (Signed)
CSN: [id number]     Arrival date & time 03/11/14  1746 History   First MD Initiated Contact with Patient 03/11/14 1747     Chief Complaint  Patient presents with  . Respiratory Distress     (Consider location/radiation/quality/duration/timing/severity/associated sxs/prior Treatment) Patient is a 80 y.o. female presenting with shortness of breath. The history is provided by the EMS personnel. The history is limited by the condition of the patient (rrespiratory distress).  Shortness of Breath Severity:  Severe Onset quality:  Gradual Duration:  1 day Timing:  Constant Progression:  Worsening Chronicity:  New Context comment:  Patient was at home today had gradual shortness of breath and called EMS. On arrival sats 59% improved with CPAP to 90s.  no cp. Relieved by: CPAP. Worsened by:  Nothing tried Ineffective treatments:  Oxygen Associated symptoms: no abdominal pain, no chest pain, no cough, no fever, no vomiting and no wheezing   Risk factors: no hx of cancer, no hx of PE/DVT, no obesity and no tobacco use   Risk factors comment:  History of aortic stenosis,  myelodysplastic syndrome   Past Medical History  Diagnosis Date  . Thrombocytopenia 2010  . Hypertension   . Heart murmur   . Wrist fracture, left   . Chronic idiopathic monocytosis   . Enlargement of lymph nodes     H/O small bilateral axillary lymph nodes  . Shortness of breath     with excertion   Past Surgical History  Procedure Laterality Date  . Hemorrhoid surgery    . Bunionectomy    . Soft tissue cyst excision      X 5 occasions--cysts on scalp  . Hip arthroplasty Right 09/04/2013    Procedure: ARTHROPLASTY BIPOLAR HIP;  Surgeon: [name], MD;  Location: [facility];  Service: Orthopedics;  Laterality: Right;   No family history on file. History  Substance Use Topics  . Smoking status: Never Smoker   . Smokeless tobacco: Not on file  . Alcohol Use: No   OB History   Grav Para Term Preterm  Abortions TAB SAB Ect Mult Living                 Review of Systems  Unable to perform ROS: Severe respiratory distress  Constitutional: Negative for fever.  Respiratory: Positive for shortness of breath. Negative for cough and wheezing.   Cardiovascular: Negative for chest pain.  Gastrointestinal: Negative for vomiting and abdominal pain.      Allergies  Milk-related compounds  Home Medications   Prior to Admission medications   Medication Sig Start Date End Date Taking? Authorizing Provider  acetaminophen (TYLENOL) 325 MG tablet Take 2 tablets (650 mg total) by mouth every 6 (six) hours as needed for mild pain (or Fever >/= 101). 09/10/13  Yes Jessica U Vann, DO  amLODipine (NORVASC) 5 MG tablet Take 5 mg by mouth daily.  08/31/13  Yes Historical Provider, MD  calcium-vitamin D (OSCAL WITH D) 250-125 MG-UNIT per tablet Take 1 tablet by mouth daily.   Yes Historical Provider, MD  Multiple Vitamins-Minerals (CENTRUM SILVER PO) Take 1 tablet by mouth daily.   Yes Historical Provider, MD  ramipril (ALTACE) 10 MG capsule Take 10 mg by mouth daily.  08/31/13  Yes Historical Provider, MD   BP 111/78  Pulse 93  Temp(Src) 98.2 F (36.8 C) (Temporal)  Resp 28  Ht 5\' 5"  (1.651 m)  Wt 107 lb (48.535 kg)  BMI 17.81 kg/m2  SpO2 91% Physical Exam  Constitutional: She appears well-developed and well-nourished. She appears distressed.  Severe respiratory distress  unable to communicate as on CPAP,  Responds to yes or no questions with head  nods  HENT:  Head: Normocephalic and atraumatic.  Mouth/Throat: Oropharynx is clear and moist. No oropharyngeal exudate.  Eyes: Conjunctivae and EOM are normal. Pupils are equal, round, and reactive to light. Right eye exhibits no discharge. Left eye exhibits no discharge. No scleral icterus.  Neck: Normal range of motion. Neck supple.  Cardiovascular: Normal heart sounds.   No murmur heard. Irregularly irregular tachycardic  Pulmonary/Chest: She is in  respiratory distress ( tachypnea, with very increased effort). She has no wheezes. She has rales (bilateral rhonchi and rales).  Abdominal: Soft. She exhibits no distension and no mass. There is no tenderness.  Neurological: She is alert. She exhibits normal muscle tone. Coordination normal.  Skin: Skin is warm. No rash noted. She is not diaphoretic.    ED Course  Procedures (including critical care time)  Ultrasound Guided Angiocath insertion  Performed by: Sol Passer  Consent: Verbal consent obtained. Risks and benefits: risks, benefits and alternatives were discussed Time out: Immediately prior to procedure a "time out" was called to verify the correct patient, procedure, equipment, support staff and site/side marked as required.  Preparation: Patient was prepped and draped in the usual sterile fashion.  Vein Location: L AC  Ultrasound Guided  Gauge: 20 g  Normal blood return and flush without difficulty Patient tolerance: Patient tolerated the procedure well with no immediate complications.     Labs Review Labs Reviewed  CBC WITH DIFFERENTIAL - Abnormal; Notable for the following:    WBC 15.8 (*)    Hemoglobin 10.9 (*)    HCT 35.8 (*)    RDW 25.5 (*)    Platelets 41 (*)    Neutrophils Relative % 78 (*)    Lymphocytes Relative 7 (*)    Monocytes Relative 15 (*)    Neutro Abs 12.3 (*)    Monocytes Absolute 2.4 (*)    All other components within normal limits  COMPREHENSIVE METABOLIC PANEL - Abnormal; Notable for the following:    Glucose, Bld 110 (*)    BUN 34 (*)    Total Protein 8.8 (*)    Albumin 3.1 (*)    AST 112 (*)    ALT 96 (*)    Total Bilirubin 1.5 (*)    GFR calc non Af Amer 54 (*)    GFR calc Af Amer 63 (*)    Anion gap 18 (*)    All other components within normal limits  PRO B NATRIURETIC PEPTIDE - Abnormal; Notable for the following:    Pro B Natriuretic peptide (BNP) 10096.0 (*)    All other components within normal limits  I-STAT CG4  LACTIC ACID, ED - Abnormal; Notable for the following:    Lactic Acid, Venous 4.99 (*)    All other components within normal limits  I-STAT ARTERIAL BLOOD GAS, ED - Abnormal; Notable for the following:    pH, Arterial 7.488 (*)    pCO2 arterial 29.8 (*)    pO2, Arterial 133.0 (*)    All other components within normal limits  CULTURE, BLOOD (ROUTINE X 2)  CULTURE, BLOOD (ROUTINE X 2)  I-STAT TROPOININ, ED    Imaging Review Dg Chest Portable 1 View  03/11/2014   CLINICAL DATA:  Respiratory distress for 1 day.  Initial encounter.  EXAM: PORTABLE CHEST - 1 VIEW  COMPARISON:  09/06/2013;  09/03/2013; 11/05/2011  FINDINGS: Grossly unchanged enlarged cardiac silhouette and mediastinal contours with atherosclerotic plaque within the thoracic aorta. The pulmonary vasculature is indistinct with cephalization of flow. Interval development small bilateral effusions and associated bibasilar heterogeneous/ consolidative opacities, left greater than right. No pneumothorax. Unchanged bones.  IMPRESSION: Cardiomegaly and atherosclerosis with findings of pulmonary edema and small bilateral effusions with associated bibasilar opacities, left greater than right, atelectasis versus infiltrate.   Electronically Signed   By: Sandi Mariscal M.D.   On: 03/11/2014 19:26     EKG Interpretation   Date/Time:  Thursday March 11 2014 17:53:16 EDT Ventricular Rate:  111 PR Interval:    QRS Duration: 80 QT Interval:  353 QTC Calculation: 480 R Axis:   28 Text Interpretation:  indeterminate, suspect atrial fibrillation  Nonspecific repol abnormality, lateral leads Confirmed by Christy Gentles  MD,  DONALD ([id number]) on 03/11/2014 5:57:17 PM       MDM   MDM: 80 year old female comes in with severe respiratory distress.  Initially 59% on room air, resolved with CPAP. Blood pressures and systolic of 24Q emergency department. In mild A. Fib with RVR here. Chest x-ray shows bilateral pulmonary edema. BNP greatly elevated.  Symptoms felt to be from critical aortic stenosis. Discussed with Dr. Einar Gip  With cardiology as the patient is not a candidate for immediate intervention. Recommends hospitalist admission. Hospitalist feels symptoms may be from pneumonia, will start vanc Zosyn in emergency department.  Patient is DO NOT RESUSCITATE. Admit to hospitalist.  Final diagnoses:  Acute pulmonary edema  Acute respiratory failure with hypoxia  Aortic stenosis, severe  Essential hypertension  Sepsis associated hypotension    Admit to Hospitalist  Sol Passer, MD 03/12/14 0022

## 2014-03-12 ENCOUNTER — Ambulatory Visit: Payer: Commercial Managed Care - HMO | Admitting: Nurse Practitioner

## 2014-03-12 ENCOUNTER — Other Ambulatory Visit: Payer: Commercial Managed Care - HMO

## 2014-03-12 ENCOUNTER — Encounter (HOSPITAL_COMMUNITY): Payer: Self-pay | Admitting: *Deleted

## 2014-03-12 LAB — BASIC METABOLIC PANEL
Anion gap: 11 (ref 5–15)
BUN: 37 mg/dL — ABNORMAL HIGH (ref 6–23)
CHLORIDE: 109 meq/L (ref 96–112)
CO2: 24 mEq/L (ref 19–32)
Calcium: 9.1 mg/dL (ref 8.4–10.5)
Creatinine, Ser: 0.84 mg/dL (ref 0.50–1.10)
GFR calc non Af Amer: 58 mL/min — ABNORMAL LOW (ref >90)
GFR, EST AFRICAN AMERICAN: 67 mL/min — AB (ref >90)
GLUCOSE: 84 mg/dL (ref 70–99)
POTASSIUM: 4.2 meq/L (ref 3.7–5.3)
Sodium: 144 mEq/L (ref 137–147)

## 2014-03-12 LAB — CBC WITH DIFFERENTIAL/PLATELET
BASOS ABS: 0 10*3/uL (ref 0.0–0.1)
Basophils Relative: 0 % (ref 0–1)
Eosinophils Absolute: 0 10*3/uL (ref 0.0–0.7)
Eosinophils Relative: 0 % (ref 0–5)
HEMATOCRIT: 31.1 % — AB (ref 36.0–46.0)
Hemoglobin: 9.2 g/dL — ABNORMAL LOW (ref 12.0–15.0)
LYMPHS PCT: 12 % (ref 12–46)
Lymphs Abs: 1.6 10*3/uL (ref 0.7–4.0)
MCH: 27.1 pg (ref 26.0–34.0)
MCHC: 29.6 g/dL — ABNORMAL LOW (ref 30.0–36.0)
MCV: 91.7 fL (ref 78.0–100.0)
MONOS PCT: 25 % — AB (ref 3–12)
Monocytes Absolute: 3.2 10*3/uL — ABNORMAL HIGH (ref 0.1–1.0)
Neutro Abs: 8.1 10*3/uL — ABNORMAL HIGH (ref 1.7–7.7)
Neutrophils Relative %: 63 % (ref 43–77)
Platelets: 34 10*3/uL — ABNORMAL LOW (ref 150–400)
RBC: 3.39 MIL/uL — AB (ref 3.87–5.11)
RDW: 25.6 % — ABNORMAL HIGH (ref 11.5–15.5)
WBC: 12.9 10*3/uL — AB (ref 4.0–10.5)

## 2014-03-12 LAB — COMPREHENSIVE METABOLIC PANEL
ALBUMIN: 2.7 g/dL — AB (ref 3.5–5.2)
ALT: 140 U/L — AB (ref 0–35)
AST: 132 U/L — AB (ref 0–37)
Alkaline Phosphatase: 54 U/L (ref 39–117)
Anion gap: 13 (ref 5–15)
BUN: 37 mg/dL — ABNORMAL HIGH (ref 6–23)
CALCIUM: 8.9 mg/dL (ref 8.4–10.5)
CO2: 23 mEq/L (ref 19–32)
Chloride: 107 mEq/L (ref 96–112)
Creatinine, Ser: 0.85 mg/dL (ref 0.50–1.10)
GFR calc Af Amer: 66 mL/min — ABNORMAL LOW (ref >90)
GFR calc non Af Amer: 57 mL/min — ABNORMAL LOW (ref >90)
Glucose, Bld: 84 mg/dL (ref 70–99)
Potassium: 4.2 mEq/L (ref 3.7–5.3)
SODIUM: 143 meq/L (ref 137–147)
TOTAL PROTEIN: 7.5 g/dL (ref 6.0–8.3)
Total Bilirubin: 1 mg/dL (ref 0.3–1.2)

## 2014-03-12 LAB — CBC
HCT: 31 % — ABNORMAL LOW (ref 36.0–46.0)
HEMOGLOBIN: 9.2 g/dL — AB (ref 12.0–15.0)
MCH: 27 pg (ref 26.0–34.0)
MCHC: 29.7 g/dL — ABNORMAL LOW (ref 30.0–36.0)
MCV: 90.9 fL (ref 78.0–100.0)
Platelets: 39 10*3/uL — ABNORMAL LOW (ref 150–400)
RBC: 3.41 MIL/uL — AB (ref 3.87–5.11)
RDW: 25.4 % — ABNORMAL HIGH (ref 11.5–15.5)
WBC: 12.5 10*3/uL — AB (ref 4.0–10.5)

## 2014-03-12 LAB — LACTIC ACID, PLASMA: Lactic Acid, Venous: 1.4 mmol/L (ref 0.5–2.2)

## 2014-03-12 LAB — TSH: TSH: 2.11 u[IU]/mL (ref 0.350–4.500)

## 2014-03-12 LAB — LEGIONELLA ANTIGEN, URINE

## 2014-03-12 LAB — STREP PNEUMONIAE URINARY ANTIGEN: Strep Pneumo Urinary Antigen: NEGATIVE

## 2014-03-12 LAB — TROPONIN I: Troponin I: <0.3 ng/mL (ref <0.30)

## 2014-03-12 LAB — MRSA PCR SCREENING: MRSA BY PCR: NEGATIVE

## 2014-03-12 MED ORDER — HYDROCODONE-ACETAMINOPHEN 5-325 MG PO TABS: 1.0000 | ORAL_TABLET | ORAL | Status: DC | PRN

## 2014-03-12 MED ORDER — ACETAMINOPHEN 650 MG RE SUPP: 650.0000 mg | Freq: Four times a day (QID) | RECTAL | Status: DC | PRN

## 2014-03-12 MED ORDER — PHENOL 1.4 % MT LIQD: 1.0000 | OROMUCOSAL | Status: DC | PRN

## 2014-03-12 MED ORDER — ACETAMINOPHEN 325 MG PO TABS: 650.0000 mg | ORAL_TABLET | Freq: Four times a day (QID) | ORAL | Status: DC | PRN

## 2014-03-12 MED ORDER — SODIUM CHLORIDE 0.9 % IJ SOLN
3.0000 mL | Freq: Two times a day (BID) | INTRAMUSCULAR | Status: DC
  Administered 2014-03-12 (×2): 3 mL via INTRAVENOUS

## 2014-03-12 MED ORDER — RAMIPRIL 2.5 MG PO CAPS
2.5000 mg | ORAL_CAPSULE | Freq: Every day | ORAL | Status: DC
  Administered 2014-03-12: 2.5 mg via ORAL
  Filled 2014-03-12: qty 1

## 2014-03-12 MED ORDER — FUROSEMIDE 10 MG/ML IJ SOLN
INTRAMUSCULAR | Status: AC
  Administered 2014-03-12: 20 mg via INTRAVENOUS
  Filled 2014-03-12: qty 4

## 2014-03-12 MED ORDER — METOPROLOL TARTRATE 12.5 MG HALF TABLET
12.5000 mg | ORAL_TABLET | Freq: Two times a day (BID) | ORAL | Status: DC
  Administered 2014-03-12 – 2014-03-15 (×7): 12.5 mg via ORAL
  Filled 2014-03-12 (×17): qty 1

## 2014-03-12 MED ORDER — CETYLPYRIDINIUM CHLORIDE 0.05 % MT LIQD
7.0000 mL | Freq: Two times a day (BID) | OROMUCOSAL | Status: DC
  Administered 2014-03-12 – 2014-03-19 (×13): 7 mL via OROMUCOSAL

## 2014-03-12 MED ORDER — MENTHOL 3 MG MT LOZG: 1.0000 | LOZENGE | OROMUCOSAL | Status: DC | PRN

## 2014-03-12 MED ORDER — ZOLPIDEM TARTRATE 5 MG PO TABS: 5.0000 mg | ORAL_TABLET | Freq: Every evening | ORAL | Status: DC | PRN

## 2014-03-12 MED ORDER — ENSURE PUDDING PO PUDG
1.0000 | Freq: Two times a day (BID) | ORAL | Status: DC
  Administered 2014-03-12 – 2014-03-19 (×12): 1 via ORAL

## 2014-03-12 MED ORDER — INFLUENZA VAC SPLIT QUAD 0.5 ML IM SUSY
0.5000 mL | PREFILLED_SYRINGE | INTRAMUSCULAR | Status: AC
  Administered 2014-03-17: 0.5 mL via INTRAMUSCULAR
  Filled 2014-03-12 (×3): qty 0.5

## 2014-03-12 MED ORDER — DEXTROSE 5 % IV SOLN: 1.0000 g | Freq: Three times a day (TID) | INTRAVENOUS | Status: DC

## 2014-03-12 MED ORDER — SODIUM CHLORIDE 0.9 % IJ SOLN: 3.0000 mL | INTRAMUSCULAR | Status: DC | PRN

## 2014-03-12 MED ORDER — SODIUM CHLORIDE 0.9 % IV SOLN: 250.0000 mL | INTRAVENOUS | Status: DC | PRN

## 2014-03-12 MED ORDER — ONDANSETRON HCL 4 MG PO TABS: 4.0000 mg | ORAL_TABLET | Freq: Four times a day (QID) | ORAL | Status: DC | PRN

## 2014-03-12 MED ORDER — BISACODYL 10 MG RE SUPP: 10.0000 mg | Freq: Every day | RECTAL | Status: DC | PRN

## 2014-03-12 MED ORDER — FUROSEMIDE 10 MG/ML IJ SOLN
20.0000 mg | Freq: Two times a day (BID) | INTRAMUSCULAR | Status: DC
  Administered 2014-03-12: 20 mg via INTRAVENOUS
  Filled 2014-03-12 (×2): qty 2

## 2014-03-12 MED ORDER — ALBUTEROL SULFATE (2.5 MG/3ML) 0.083% IN NEBU: 2.5000 mg | INHALATION_SOLUTION | RESPIRATORY_TRACT | Status: DC | PRN

## 2014-03-12 MED ORDER — SODIUM CHLORIDE 0.9 % IJ SOLN
3.0000 mL | Freq: Two times a day (BID) | INTRAMUSCULAR | Status: DC
  Administered 2014-03-12: 3 mL via INTRAVENOUS

## 2014-03-12 MED ORDER — ONDANSETRON HCL 4 MG/2ML IJ SOLN: 4.0000 mg | Freq: Four times a day (QID) | INTRAMUSCULAR | Status: DC | PRN

## 2014-03-12 MED ORDER — SENNOSIDES-DOCUSATE SODIUM 8.6-50 MG PO TABS
2.0000 | ORAL_TABLET | Freq: Every day | ORAL | Status: DC
  Administered 2014-03-12 – 2014-03-19 (×7): 2 via ORAL
  Filled 2014-03-12 (×8): qty 2

## 2014-03-12 MED ORDER — VANCOMYCIN HCL IN DEXTROSE 750-5 MG/150ML-% IV SOLN
750.0000 mg | INTRAVENOUS | Status: DC
  Filled 2014-03-12: qty 150

## 2014-03-12 MED ORDER — PNEUMOCOCCAL VAC POLYVALENT 25 MCG/0.5ML IJ INJ
0.5000 mL | INJECTION | INTRAMUSCULAR | Status: AC
  Administered 2014-03-17: 0.5 mL via INTRAMUSCULAR
  Filled 2014-03-12 (×3): qty 0.5

## 2014-03-12 NOTE — Progress Notes (Signed)
ANTIBIOTIC CONSULT NOTE - Follow Up  Pharmacy Consult for Cefepime/Vancomycin Indication: HCAP  Allergies  Allergen Reactions  . Milk-Related Compounds Other (See Comments)    Lactose intolerant    Patient Measurements: Height: 5' 3.5" (161.3 cm) Weight: 120 lb 5.9 oz (54.6 kg) IBW/kg (Calculated) : 53.55  Vital Signs: Temp: 98.1 F (36.7 C) (10/16 0800) Temp Source: Oral (10/16 0800) BP: 97/54 mmHg (10/16 1044) Pulse Rate: 88 (10/16 1038)  Labs:  Recent Labs  03/11/14 1832 03/12/14 0732 03/12/14 0818  WBC 15.8* 12.5* PENDING  HGB 10.9* 9.2* 9.2*  PLT 41* 39* PENDING  CREATININE 0.89 0.84 0.85   Estimated Creatinine Clearance: 35 ml/min (by C-G formula based on Cr of 0.85). No results found for this basename: VANCOTROUGH, Corlis Leak, VANCORANDOM, GENTTROUGH, GENTPEAK, GENTRANDOM, TOBRATROUGH, TOBRAPEAK, TOBRARND, AMIKACINPEAK, AMIKACINTROU, AMIKACIN,  in the last 72 hours   Microbiology: Recent Results (from the past 720 hour(s))  MRSA PCR SCREENING     Status: None   Collection Time    03/12/14 12:27 AM      Result Value Ref Range Status   MRSA by PCR NEGATIVE  NEGATIVE Final   Comment:            The GeneXpert MRSA Assay (FDA     approved for NASAL specimens     only), is one component of a     comprehensive MRSA colonization     surveillance program. It is not     intended to diagnose MRSA     infection nor to guide or     monitor treatment for     MRSA infections.    Medical History: Past Medical History  Diagnosis Date  . Thrombocytopenia 2010  . Hypertension   . Heart murmur   . Wrist fracture, left   . Chronic idiopathic monocytosis   . Enlargement of lymph nodes     H/O small bilateral axillary lymph nodes  . Shortness of breath     with excertion   Assessment: 80 y/o F from assisted living w/ SOB initially satting 59% on room air, improved with CPAP.  Pt remains Afebrile, WBC pending. CXR suggestive of pneumonia. CrCl ~ 35 mL/min.    Cefepime 10/16>>  Vanc 10/16>>   10/15 Blood Cx x2>>  10/15 Sputum >>    Goal of Therapy:  Vancomycin trough 15-20  Plan:  Cefepime IV 1 gram q24h Increase Vancomycin IV to 750 mg q24h F/u vancomycin trough, renal function, cultures and suseptibilities   Albertina Parr, PharmD.  Clinical Pharmacist Pager [phone]

## 2014-03-12 NOTE — Progress Notes (Signed)
[place] TEAM 1 - Stepdown/ICU TEAM Progress Note  Ashley Lutz MVE:[id number] DOB: August 24, 1918 DOA: 03/11/2014 PCP: Mathews Argyle, MD  Admit HPI / Brief Narrative: 80 y.o. F with history of thrombocytopenia, hypertension, and aortic stenosis, sent from her assisted living facility with increasing shortness of breath for a few days. Patient denied chest pains, nausea or vomiting.   In the emergency room the patient was found to have congestive heart failure and bilateral basal pneumonia and a blood pressure on the low side . Patient's son was in the room and gave most of the history and reported that his mother's wishes are DO NOT RESUSCITATE. The patient was placed on CPAP and felt better.  HPI/Subjective: Pt states she is feeling better.  She denies cp, n/v, or abdom pain.  Her sob is less severe.    Assessment/Plan:  Sepsis due to bibasilar HCAP Sepsis physiology is improving   Acute diastolic CHF exacerbation / AoS exacerbation Care w/ volume - will be tough to balance in setting of AoS - daily wgts and Is/Os  Newly diagnosed Afib w/ RVR  TSH normal - rate presently controlled - follow on tele   Acute hypoxic respiratory failure  O2 requirements decreasing - cont to tx underlying issues and follow   Chronic thrombocytopenia No evidence of acute blood loss - follow trend   Mild transaminitis  Follow trend   HTN Not presently and active issue  Code Status: DNR Family Communication: spoke w/ 2 sons at bedside  Disposition Plan: SDU  Consultants: none  Procedures: none  Antibiotics: maxipime 10/15 > vanc 10/15 >  DVT prophylaxis: SCDs  Objective: Blood pressure 107/51, pulse 91, temperature 97.5 F (36.4 C), temperature source Oral, resp. rate 32, height 5' 3.5" (1.613 m), weight 54.6 kg (120 lb 5.9 oz), SpO2 91.00%.  Intake/Output Summary (Last 24 hours) at 03/12/14 1400 Last data filed at 03/12/14 1200  Gross per 24 hour  Intake   1113 ml    Output    100 ml  Net   1013 ml    Exam: General: No acute respiratory distress at rest  Lungs: bibasilar crackles - no wheeze  Cardiovascular: irreg irreg - rate controlled - prominent 3/6 holosystolic M Abdomen: Nontender, nondistended, soft, bowel sounds positive, no rebound, no ascites, no appreciable mass Extremities: No significant cyanosis, clubbing;  2+ doughy pedal edema bilateral lower extremities  Data Reviewed: Basic Metabolic Panel:  Recent Labs Lab 03/11/14 1832 03/12/14 0732 03/12/14 0818  NA 143 144 143  K 5.3 4.2 4.2  CL 105 109 107  CO2 20 24 23   GLUCOSE 110* 84 84  BUN 34* 37* 37*  CREATININE 0.89 0.84 0.85  CALCIUM 9.3 9.1 8.9    Liver Function Tests:  Recent Labs Lab 03/11/14 1832 03/12/14 0818  AST 112* 132*  ALT 96* 140*  ALKPHOS 66 54  BILITOT 1.5* 1.0  PROT 8.8* 7.5  ALBUMIN 3.1* 2.7*    Coags: No results found for this basename: PT, INR,  in the last 168 hours No results found for this basename: PTT,  in the last 168 hours  CBC:  Recent Labs Lab 03/11/14 1832 03/12/14 0732 03/12/14 0818  WBC 15.8* 12.5* 12.9*  NEUTROABS 12.3*  --  8.1*  HGB 10.9* 9.2* 9.2*  HCT 35.8* 31.0* 31.1*  MCV 90.2 90.9 91.7  PLT 41* 39* 34*    Cardiac Enzymes:  Recent Labs Lab 03/12/14 0152 03/12/14 0732 03/12/14 1214  TROPONINI <0.30 <0.30 <0.30  BNP (last 3 results)  Recent Labs  09/07/13 1655 03/11/14 1844  PROBNP 3126.0* 10096.0*     Recent Results (from the past 240 hour(s))  MRSA PCR SCREENING     Status: None   Collection Time    03/12/14 12:27 AM      Result Value Ref Range Status   MRSA by PCR NEGATIVE  NEGATIVE Final   Comment:            The GeneXpert MRSA Assay (FDA     approved for NASAL specimens     only), is one component of a     comprehensive MRSA colonization     surveillance program. It is not     intended to diagnose MRSA     infection nor to guide or     monitor treatment for     MRSA infections.      Studies:  Recent x-ray studies have been reviewed in detail by the Attending Physician  Scheduled Meds:  Scheduled Meds: . antiseptic oral rinse  7 mL Mouth Rinse BID  . ceFEPime (MAXIPIME) IV  1 g Intravenous Q24H  . feeding supplement (ENSURE)  1 Container Oral BID BM  . [START ON 03/13/2014] Influenza vac split quadrivalent PF  0.5 mL Intramuscular Tomorrow-1000  . metoprolol tartrate  12.5 mg Oral BID  . [START ON 03/13/2014] pneumococcal 23 valent vaccine  0.5 mL Intramuscular Tomorrow-1000  . ramipril  2.5 mg Oral Daily  . senna-docusate  2 tablet Oral Daily  . sodium chloride  3 mL Intravenous Q12H  . vancomycin  750 mg Intravenous Q24H    Time spent on care of this patient: 35 mins   Symone Cornman T , MD   [facility] Office  [phone] Pager - Text Page per Shea Evans as per below:  On-Call/Text Page:      [email or web address]      password [email or web address]  If 7PM-7AM, please contact night-coverage [email or web address] Password [email or web address] 03/12/2014, 2:00 PM   LOS: 1 day

## 2014-03-12 NOTE — Progress Notes (Signed)
Utilization review completed.  

## 2014-03-12 NOTE — ED Provider Notes (Signed)
I have personally seen and examined the patient.  I have discussed the plan of care with the resident.  I have reviewed the documentation on PMH/FH/Soc. History.  I have reviewed the documentation of the resident and agree.  Pt stabilized in the ER and her respiratory effort was improved  [place] images NOT archived  CRITICAL CARE Performed by: Sharyon Cable Total critical care time: 31 Critical care time was exclusive of separately billable procedures and treating other patients. Critical care was necessary to treat or prevent imminent or life-threatening deterioration. Critical care was time spent personally by me on the following activities: development of treatment plan with patient and/or surrogate as well as nursing, discussions with consultants, evaluation of patient's response to treatment, examination of patient, obtaining history from patient or surrogate, ordering and performing treatments and interventions, ordering and review of laboratory studies, ordering and review of radiographic studies, pulse oximetry and re-evaluation of patient's condition.   Sharyon Cable, MD 03/12/14 [phone]

## 2014-03-13 ENCOUNTER — Inpatient Hospital Stay (HOSPITAL_COMMUNITY): Payer: Medicare HMO

## 2014-03-13 LAB — COMPREHENSIVE METABOLIC PANEL
ALK PHOS: 49 U/L (ref 39–117)
ALT: 114 U/L — AB (ref 0–35)
AST: 78 U/L — AB (ref 0–37)
Albumin: 2.4 g/dL — ABNORMAL LOW (ref 3.5–5.2)
Anion gap: 13 (ref 5–15)
BUN: 34 mg/dL — ABNORMAL HIGH (ref 6–23)
CALCIUM: 8 mg/dL — AB (ref 8.4–10.5)
CO2: 21 meq/L (ref 19–32)
Chloride: 104 mEq/L (ref 96–112)
Creatinine, Ser: 0.74 mg/dL (ref 0.50–1.10)
GFR, EST AFRICAN AMERICAN: 82 mL/min — AB (ref >90)
GFR, EST NON AFRICAN AMERICAN: 71 mL/min — AB (ref >90)
Glucose, Bld: 75 mg/dL (ref 70–99)
POTASSIUM: 3.6 meq/L — AB (ref 3.7–5.3)
SODIUM: 138 meq/L (ref 137–147)
Total Bilirubin: 0.9 mg/dL (ref 0.3–1.2)
Total Protein: 6.9 g/dL (ref 6.0–8.3)

## 2014-03-13 LAB — CBC
HCT: 27.8 % — ABNORMAL LOW (ref 36.0–46.0)
Hemoglobin: 8.3 g/dL — ABNORMAL LOW (ref 12.0–15.0)
MCH: 27.3 pg (ref 26.0–34.0)
MCHC: 29.9 g/dL — ABNORMAL LOW (ref 30.0–36.0)
MCV: 91.4 fL (ref 78.0–100.0)
Platelets: 35 10*3/uL — ABNORMAL LOW (ref 150–400)
RBC: 3.04 MIL/uL — AB (ref 3.87–5.11)
RDW: 25 % — ABNORMAL HIGH (ref 11.5–15.5)
WBC: 8.9 10*3/uL (ref 4.0–10.5)

## 2014-03-13 LAB — PHOSPHORUS: PHOSPHORUS: 3.2 mg/dL (ref 2.3–4.6)

## 2014-03-13 LAB — MAGNESIUM: Magnesium: 2.1 mg/dL (ref 1.5–2.5)

## 2014-03-13 MED ORDER — POTASSIUM CHLORIDE CRYS ER 20 MEQ PO TBCR
40.0000 meq | EXTENDED_RELEASE_TABLET | Freq: Once | ORAL | Status: AC
  Administered 2014-03-13: 40 meq via ORAL
  Filled 2014-03-13: qty 2

## 2014-03-13 MED ORDER — HYDROCODONE-ACETAMINOPHEN 5-325 MG PO TABS: 1.0000 | ORAL_TABLET | Freq: Four times a day (QID) | ORAL | Status: DC | PRN

## 2014-03-13 NOTE — Progress Notes (Addendum)
[place] TEAM 1 - Stepdown/ICU TEAM Progress Note  Ashley Lutz NAT:[id number] DOB: May 11, 1919 DOA: 03/11/2014 PCP: Mathews Argyle, MD  Admit HPI / Brief Narrative: 80 y.o. F with history of thrombocytopenia, hypertension, and aortic stenosis, sent from her assisted living facility with increasing shortness of breath for a few days. Patient denied chest pain, nausea, or vomiting.   In the emergency room the patient was found to have congestive heart failure and bilateral basal infiltrates worrisome for PNA, and a blood pressure on the low side . Patient's son was in the room and gave most of the history and reported that his mother's wishes are DO NOT RESUSCITATE. The patient was placed on CPAP and felt better.  HPI/Subjective: Pt is alert and conversant.  She states she is feeling better.  She has no new complaints, but states she feels weak in general.    Assessment/Plan:  Sepsis due to bibasilar HCAP Sepsis physiology resolved - cont empiric abx for HCAP - f/u CXR appears stable - WBC has normalized   Acute tachycardia related diastolic CHF exacerbation / AoS exacerbation Care w/ volume - will be tough to balance in setting of AoS - daily wgts and Is/Os - wgt stable   Newly diagnosed Afib w/ RVR  TSH normal - rate controlled - follow on tele - not a safe candidate for anticoag given advanced age and generalized weakness w/ chronic thrombocytopenia  Acute hypoxic respiratory failure  O2 requirements decreasing - cont to tx underlying issues and titrate O2 down as able   Chronic thrombocytopenia No evidence of acute blood loss - Plt count stable -  follow trend   Mild transaminitis  Follow trend - improving - likely mild "shock liver" in setting of RVR and diminished CO as result   HTN Not presently an active issue  Code Status: DNR Family Communication: spoke w/ 2 sons at bedside  Disposition Plan: stable for transfer to tele bed - begin PT/OT - wean O2 as able    Consultants: none  Procedures: none  Antibiotics: maxipime 10/15 > vanc 10/15 >  DVT prophylaxis: SCDs  Objective: Blood pressure 116/69, pulse 183, temperature 97.4 F (36.3 C), temperature source Axillary, resp. rate 19, height 5' 3.5" (1.613 m), weight 54 kg (119 lb 0.8 oz), SpO2 93.00%.  Intake/Output Summary (Last 24 hours) at 03/13/14 1142 Last data filed at 03/13/14 0600  Gross per 24 hour  Intake    590 ml  Output    375 ml  Net    215 ml    Exam: General: No acute respiratory distress at rest  Lungs: bibasilar crackles - no wheeze  Cardiovascular: irreg irreg - rate controlled - prominent 3/6 holosystolic M Abdomen: Nontender, nondistended, soft, bowel sounds positive, no rebound, no ascites, no appreciable mass Extremities: No significant cyanosis, clubbing;  2+ doughy pedal edema bilateral lower extremities w/o signif change   Data Reviewed: Basic Metabolic Panel:  Recent Labs Lab 03/11/14 1832 03/12/14 0732 03/12/14 0818 03/13/14 0248  NA 143 144 143 138  K 5.3 4.2 4.2 3.6*  CL 105 109 107 104  CO2 20 24 23 21   GLUCOSE 110* 84 84 75  BUN 34* 37* 37* 34*  CREATININE 0.89 0.84 0.85 0.74  CALCIUM 9.3 9.1 8.9 8.0*  MG  --   --   --  2.1  PHOS  --   --   --  3.2    Liver Function Tests:  Recent Labs Lab 03/11/14 1832 03/12/14 0818  03/13/14 0248  AST 112* 132* 78*  ALT 96* 140* 114*  ALKPHOS 66 54 49  BILITOT 1.5* 1.0 0.9  PROT 8.8* 7.5 6.9  ALBUMIN 3.1* 2.7* 2.4*    Coags: No results found for this basename: PT, INR,  in the last 168 hours No results found for this basename: PTT,  in the last 168 hours  CBC:  Recent Labs Lab 03/11/14 1832 03/12/14 0732 03/12/14 0818 03/13/14 0248  WBC 15.8* 12.5* 12.9* 8.9  NEUTROABS 12.3*  --  8.1*  --   HGB 10.9* 9.2* 9.2* 8.3*  HCT 35.8* 31.0* 31.1* 27.8*  MCV 90.2 90.9 91.7 91.4  PLT 41* 39* 34* 35*    Cardiac Enzymes:  Recent Labs Lab 03/12/14 0152 03/12/14 0732  03/12/14 1214  TROPONINI <0.30 <0.30 <0.30   BNP (last 3 results)  Recent Labs  09/07/13 1655 03/11/14 1844  PROBNP 3126.0* 10096.0*     Recent Results (from the past 240 hour(s))  MRSA PCR SCREENING     Status: None   Collection Time    03/12/14 12:27 AM      Result Value Ref Range Status   MRSA by PCR NEGATIVE  NEGATIVE Final   Comment:            The GeneXpert MRSA Assay (FDA     approved for NASAL specimens     only), is one component of a     comprehensive MRSA colonization     surveillance program. It is not     intended to diagnose MRSA     infection nor to guide or     monitor treatment for     MRSA infections.     Studies:  Recent x-ray studies have been reviewed in detail by the Attending Physician  Scheduled Meds:  Scheduled Meds: . antiseptic oral rinse  7 mL Mouth Rinse BID  . ceFEPime (MAXIPIME) IV  1 g Intravenous Q24H  . feeding supplement (ENSURE)  1 Container Oral BID BM  . Influenza vac split quadrivalent PF  0.5 mL Intramuscular Tomorrow-1000  . metoprolol tartrate  12.5 mg Oral BID  . pneumococcal 23 valent vaccine  0.5 mL Intramuscular Tomorrow-1000  . senna-docusate  2 tablet Oral Daily    Time spent on care of this patient: 35 mins   MCCLUNG,JEFFREY T , MD   [facility] Office  [phone] Pager - Text Page per Shea Evans as per below:  On-Call/Text Page:      [email or web address]      password [email or web address]  If 7PM-7AM, please contact night-coverage [email or web address] Password [email or web address] 03/13/2014, 11:42 AM   LOS: 2 days

## 2014-03-13 NOTE — Progress Notes (Signed)
Pt TX to 2W-10, family present & aware of [place]

## 2014-03-14 LAB — BASIC METABOLIC PANEL
ANION GAP: 10 (ref 5–15)
BUN: 21 mg/dL (ref 6–23)
CALCIUM: 8.2 mg/dL — AB (ref 8.4–10.5)
CHLORIDE: 107 meq/L (ref 96–112)
CO2: 23 meq/L (ref 19–32)
Creatinine, Ser: 0.58 mg/dL (ref 0.50–1.10)
GFR calc Af Amer: 89 mL/min — ABNORMAL LOW (ref >90)
GFR calc non Af Amer: 77 mL/min — ABNORMAL LOW (ref >90)
GLUCOSE: 82 mg/dL (ref 70–99)
Potassium: 4.3 mEq/L (ref 3.7–5.3)
Sodium: 140 mEq/L (ref 137–147)

## 2014-03-14 LAB — CBC
HEMATOCRIT: 30 % — AB (ref 36.0–46.0)
HEMOGLOBIN: 8.8 g/dL — AB (ref 12.0–15.0)
MCH: 27.2 pg (ref 26.0–34.0)
MCHC: 29.3 g/dL — AB (ref 30.0–36.0)
MCV: 92.9 fL (ref 78.0–100.0)
Platelets: 43 10*3/uL — ABNORMAL LOW (ref 150–400)
RBC: 3.23 MIL/uL — ABNORMAL LOW (ref 3.87–5.11)
RDW: 25.1 % — ABNORMAL HIGH (ref 11.5–15.5)
WBC: 7.4 10*3/uL (ref 4.0–10.5)

## 2014-03-14 NOTE — Progress Notes (Addendum)
[place] TEAM 1 - Stepdown/ICU TEAM Progress Note  Ashley Lutz WER:[id number] DOB: 1919-04-28 DOA: 03/11/2014 PCP: Mathews Argyle, MD  Admit HPI / Brief Narrative: 80 y.o. F with history of thrombocytopenia, hypertension, and aortic stenosis, sent from her assisted living facility with increasing shortness of breath for a few days. Patient denied chest pain, nausea, or vomiting.   In the emergency room the patient was found to have congestive heart failure and bilateral basal infiltrates worrisome for PNA, and a blood pressure on the low side . Patient's son was in the room and gave most of the history and reported that his mother's wishes are DO NOT RESUSCITATE. The patient was placed on CPAP and felt better.  HPI/Subjective: Pt is alert and conversant.  She states she is feeling better.  She has no new complaints, but states she feels weak in general.   She wants to eat hot dog. And the family at bedside upset that she is on dysphagia 2 diet.   Assessment/Plan:  Sepsis due to bibasilar HCAP Sepsis physiology resolved - cont empiric abx for HCAP - f/u CXR appears stable - WBC has normalized . One more day of cefepime and plan to d/c in am. Repeat CXR in am.   Acute tachycardia related diastolic CHF exacerbation / AoS exacerbation Care w/ volume - will be tough to balance in setting of AoS - daily wgts and Is/Os - wgt stable   Newly diagnosed Afib w/ RVR  TSH normal - rate controlled - follow on tele - not a safe candidate for anticoag given advanced age and generalized weakness w/ chronic thrombocytopenia  Acute hypoxic respiratory failure  O2 requirements decreasing - cont to tx underlying issues and titrate O2 down as able . She is still requiring 5 liters of oxygen.   Chronic thrombocytopenia No evidence of acute blood loss - Plt count stable -  follow trend   Mild transaminitis  Follow trend - improving - likely mild "shock liver" in setting of RVR and diminished  CO as result   HTN Not presently an active issue  Code Status: DNR Family Communication: spoke w/ 2 sons at bedside  Disposition Plan:- begin PT/OT - wean O2 as able   Consultants: none  Procedures: none  Antibiotics: maxipime 10/15 > vanc 10/15 > 10/17  DVT prophylaxis: SCDs  Objective: Blood pressure 119/67, pulse 80, temperature 97.8 F (36.6 C), temperature source Oral, resp. rate 20, height 5' 3.5" (1.613 m), weight 52.6 kg (115 lb 15.4 oz), SpO2 96.00%.  Intake/Output Summary (Last 24 hours) at 03/14/14 1730 Last data filed at 03/14/14 0631  Gross per 24 hour  Intake      0 ml  Output    200 ml  Net   -200 ml    Exam: General: No acute respiratory distress at rest  Lungs: bibasilar crackles - no wheeze  Cardiovascular: irreg irreg - rate controlled - prominent 3/6 holosystolic M Abdomen: Nontender, nondistended, soft, bowel sounds positive, no rebound, no ascites, no appreciable mass Extremities: No significant cyanosis, clubbing;  2+ doughy pedal edema bilateral lower extremities w/o signif change   Data Reviewed: Basic Metabolic Panel:  Recent Labs Lab 03/11/14 1832 03/12/14 0732 03/12/14 0818 03/13/14 0248 03/14/14 0430  NA 143 144 143 138 140  K 5.3 4.2 4.2 3.6* 4.3  CL 105 109 107 104 107  CO2 20 24 23 21 23   GLUCOSE 110* 84 84 75 82  BUN 34* 37* 37* 34* 21  CREATININE  0.89 0.84 0.85 0.74 0.58  CALCIUM 9.3 9.1 8.9 8.0* 8.2*  MG  --   --   --  2.1  --   PHOS  --   --   --  3.2  --     Liver Function Tests:  Recent Labs Lab 03/11/14 1832 03/12/14 0818 03/13/14 0248  AST 112* 132* 78*  ALT 96* 140* 114*  ALKPHOS 66 54 49  BILITOT 1.5* 1.0 0.9  PROT 8.8* 7.5 6.9  ALBUMIN 3.1* 2.7* 2.4*    Coags: No results found for this basename: PT, INR,  in the last 168 hours No results found for this basename: PTT,  in the last 168 hours  CBC:  Recent Labs Lab 03/11/14 1832 03/12/14 0732 03/12/14 0818 03/13/14 0248 03/14/14 0430   WBC 15.8* 12.5* 12.9* 8.9 7.4  NEUTROABS 12.3*  --  8.1*  --   --   HGB 10.9* 9.2* 9.2* 8.3* 8.8*  HCT 35.8* 31.0* 31.1* 27.8* 30.0*  MCV 90.2 90.9 91.7 91.4 92.9  PLT 41* 39* 34* 35* 43*    Cardiac Enzymes:  Recent Labs Lab 03/12/14 0152 03/12/14 0732 03/12/14 1214  TROPONINI <0.30 <0.30 <0.30   BNP (last 3 results)  Recent Labs  09/07/13 1655 03/11/14 1844  PROBNP 3126.0* 10096.0*     Recent Results (from the past 240 hour(s))  CULTURE, BLOOD (ROUTINE X 2)     Status: None   Collection Time    03/11/14  6:32 PM      Result Value Ref Range Status   Specimen Description BLOOD ARM RIGHT   Final   Special Requests BOTTLES DRAWN AEROBIC ONLY 3CC   Final   Culture  Setup Time     Final   Value: 03/12/2014 01:08     Performed at [facility]   Culture     Final   Value:        BLOOD CULTURE RECEIVED NO GROWTH TO DATE CULTURE WILL BE HELD FOR 5 DAYS BEFORE ISSUING A FINAL NEGATIVE REPORT     Performed at [facility]   Report Status PENDING   Incomplete  CULTURE, BLOOD (ROUTINE X 2)     Status: None   Collection Time    03/11/14  6:45 PM      Result Value Ref Range Status   Specimen Description BLOOD ARM RIGHT   Final   Special Requests BOTTLES DRAWN AEROBIC AND ANAEROBIC 5CC   Final   Culture  Setup Time     Final   Value: 03/12/2014 01:06     Performed at [facility]   Culture     Final   Value:        BLOOD CULTURE RECEIVED NO GROWTH TO DATE CULTURE WILL BE HELD FOR 5 DAYS BEFORE ISSUING A FINAL NEGATIVE REPORT     Performed at [facility]   Report Status PENDING   Incomplete  MRSA PCR SCREENING     Status: None   Collection Time    03/12/14 12:27 AM      Result Value Ref Range Status   MRSA by PCR NEGATIVE  NEGATIVE Final   Comment:            The GeneXpert MRSA Assay (FDA     approved for NASAL specimens     only), is one component of a     comprehensive MRSA colonization     surveillance program. It is not  intended to diagnose MRSA     infection nor to guide or     monitor treatment for     MRSA infections.     Studies:  Recent x-ray studies have been reviewed in detail by the Attending Physician  Scheduled Meds:  Scheduled Meds: . antiseptic oral rinse  7 mL Mouth Rinse BID  . ceFEPime (MAXIPIME) IV  1 g Intravenous Q24H  . feeding supplement (ENSURE)  1 Container Oral BID BM  . Influenza vac split quadrivalent PF  0.5 mL Intramuscular Tomorrow-1000  . metoprolol tartrate  12.5 mg Oral BID  . pneumococcal 23 valent vaccine  0.5 mL Intramuscular Tomorrow-1000  . senna-docusate  2 tablet Oral Daily    Time spent on care of this patient: 75 mins   Leyanna Bittman , MD   [facility] Office  [phone] Pager - Text Page per Shea Evans as per below:  On-Call/Text Page:      [email or web address]      password [email or web address]  If 7PM-7AM, please contact night-coverage [email or web address] Password [email or web address] 03/14/2014, 5:30 PM   LOS: 3 days

## 2014-03-14 NOTE — Progress Notes (Signed)
PT Cancellation Note  Patient Details Name: Ashley Lutz MRN: [id number] DOB: 1918/06/15   Cancelled Treatment:    Reason Eval/Treat Not Completed: Fatigue/lethargy limiting ability to participate -- Attempted x2 to see pt, but she is sleeping, arouses but lethargic and declines mobility.  Per RN, she has been up to [facility] with assist, and per family, she is normally ambulatory with RW, but needs assist from son for ADLs.  Will attempt mobility eval next date.     Kearney Hard [facility] 03/14/2014, 3:11 PM

## 2014-03-15 ENCOUNTER — Inpatient Hospital Stay (HOSPITAL_COMMUNITY): Payer: Medicare HMO

## 2014-03-15 ENCOUNTER — Other Ambulatory Visit: Payer: Commercial Managed Care - HMO

## 2014-03-15 ENCOUNTER — Ambulatory Visit: Payer: Commercial Managed Care - HMO | Admitting: Nurse Practitioner

## 2014-03-15 DIAGNOSIS — E43 Unspecified severe protein-calorie malnutrition: Secondary | ICD-10-CM | POA: Diagnosis present

## 2014-03-15 MED ORDER — FUROSEMIDE 10 MG/ML IJ SOLN
20.0000 mg | Freq: Two times a day (BID) | INTRAMUSCULAR | Status: DC
  Administered 2014-03-15 – 2014-03-17 (×6): 20 mg via INTRAVENOUS
  Filled 2014-03-15 (×9): qty 2

## 2014-03-15 NOTE — Clinical Documentation Improvement (Signed)
MD's, NP's, and PA's   Patient with BMI 17.81 on admit,  then BMI 21.0 , has history of "malnutrition" if this diagnosis is appropriate for this admission please document in notes.  Thank you  Possible Clinical Conditions?  Severe Malnutrition   Protein Calorie Malnutrition Severe Protein Calorie Malnutrition Emaciation  Cachexia    Other Condition Cannot clinically determine   Risk Factors: Age, Sepsis, Pneumonia , Dysphagia diet  Treatment: daily weights   Thank You, Ree Kida ,RN Clinical Documentation Specialist:  [facility] Information Management

## 2014-03-15 NOTE — Evaluation (Signed)
Physical Therapy Evaluation Patient Details Name: Ashley Lutz MRN: [id number] DOB: 02-05-1919 Today's Date: 03/15/2014   History of Present Illness  80 y.o. F with history of thrombocytopenia, hypertension, and aortic stenosis, from home with increasing shortness of breath for a few days. Patient denied chest pain, nausea, or vomiting. In the emergency room the patient was found to have congestive heart failure and bilateral basal infiltrates worrisome for PNA, and a blood pressure on the low side . Pt with pulmonary edema and CHG  Clinical Impression  Pt initially lethargic on arrival and would not open eyes or communicate until sitting EOB. Pt unaware of month but aware of place and person. Pt lives with son and normally very independent with sons able to provide 24hr care at home. Pt with limited mobility and benefits from encouragement to maximize mobility. Pt will benefit from acute therapy to maximize function, strength, gait and transfers to decrease burden of care.     Follow Up Recommendations Home health PT;Supervision/Assistance - 24 hour    Equipment Recommendations  None recommended by PT    Recommendations for Other Services       Precautions / Restrictions Precautions Precautions: Fall Precaution Comments: watch sats Restrictions Weight Bearing Restrictions: No      Mobility  Bed Mobility Overal bed mobility: Needs Assistance Bed Mobility: Rolling;Sidelying to Sit Rolling: Mod assist Sidelying to sit: Mod assist       General bed mobility comments: cues for hand placement, sequence and to attend to task  Transfers Overall transfer level: Needs assistance   Transfers: Sit to/from Stand Sit to Stand: Min assist         General transfer comment: cues for hand placement, safety and sequence to stand from bedx 1 and from chair x 2 with assist to control descent as pt maintaining grip on RW with descent  Ambulation/Gait Ambulation/Gait assistance: Min  assist Ambulation Distance (Feet): 45 Feet Assistive device: Rolling walker (2 wheeled) Gait Pattern/deviations: Step-through pattern;Decreased stride length;Trunk flexed;Narrow base of support   Gait velocity interpretation: Below normal speed for age/gender General Gait Details: pt with very flexed posture with little effort to correct despite verbal and tactile cueing. Chair pulled behind for safety and fatigue  Stairs            Wheelchair Mobility    Modified Rankin (Stroke Patients Only)       Balance Overall balance assessment: Needs assistance   Sitting balance-Leahy Scale: Fair       Standing balance-Leahy Scale: Poor                               Pertinent Vitals/Pain Pain Assessment: No/denies pain Sats 94-91% on 6L with activity. Attempted RA in static sitting with sats dropping to 78% with O2 immediately returned    Home Living Family/patient expects to be discharged to:: Private residence Living Arrangements: Children Available Help at Discharge: Family;Available 24 hours/day Type of Home: House Home Access: Stairs to enter Entrance Stairs-Rails: Left (Pole not rail) Entrance Stairs-Number of Steps: 2 Home Layout: One level Home Equipment: Walker - 2 wheels;Bedside commode (son wants tub bench, pt current sink-bathes)      Prior Function Level of Independence: Independent with assistive device(s) (set up for bathing at sink, o/w dresses self, uses RW )         Comments: RW     Hand Dominance   Dominant Hand: Right  Extremity/Trunk Assessment   Upper Extremity Assessment: Defer to OT evaluation           Lower Extremity Assessment: RLE deficits/detail;LLE deficits/detail RLE Deficits / Details: hip flexion 4/5, knee extension 4/5 bil LE LLE Deficits / Details: hip flexion 4/5, knee extension 4/5 bil LE  Cervical / Trunk Assessment: Kyphotic  Communication   Communication: HOH  Cognition Arousal/Alertness:  Lethargic Behavior During Therapy: Flat affect Overall Cognitive Status: Impaired/Different from baseline Area of Impairment: Orientation;Memory;Safety/judgement;Problem solving Orientation Level: Time   Memory: Decreased short-term memory   Safety/Judgement: Decreased awareness of deficits;Decreased awareness of safety   Problem Solving: Slow processing      General Comments      Exercises        Assessment/Plan    PT Assessment Patient needs continued PT services  PT Diagnosis Difficulty walking;Generalized weakness   PT Problem List Decreased strength;Decreased cognition;Decreased activity tolerance;Decreased balance;Decreased mobility;Cardiopulmonary status limiting activity;Decreased safety awareness;Decreased knowledge of use of DME;Decreased skin integrity  PT Treatment Interventions Gait training;DME instruction;Balance training;Functional mobility training;Therapeutic activities;Therapeutic exercise;Patient/family education   PT Goals (Current goals can be found in the Care Plan section) Acute Rehab PT Goals Patient Stated Goal: return home and have a hot dog PT Goal Formulation: With patient/family Time For Goal Achievement: 03/29/14 Potential to Achieve Goals: Fair    Frequency Min 3X/week   Barriers to discharge   sons can arrange 24hr care    Co-evaluation PT/OT/SLP Co-Evaluation/Treatment: Yes Reason for Co-Treatment: Necessary to address cognition/behavior during functional activity;Other (comment) (Pt very lethargic on arrival and not communicating until in sitting) PT goals addressed during session: Mobility/safety with mobility         End of Session Equipment Utilized During Treatment: Oxygen Activity Tolerance: Patient limited by fatigue Patient left: in chair;with call bell/phone within reach;with family/visitor present Nurse Communication: Mobility status;Precautions         Time: 7322-0254 PT Time Calculation (min): 23  min   Charges:   PT Evaluation $Initial PT Evaluation Tier I: 1 Procedure PT Treatments $Gait Training: 8-22 mins   PT G CodesMelford Aase 03/15/2014, 12:58 PM Elwyn Reach, [place]

## 2014-03-15 NOTE — Evaluation (Signed)
Occupational Therapy Evaluation Patient Details Name: Ashley Lutz MRN: [id number] DOB: Nov 18, 1918 Today's Date: 03/15/2014    History of Present Illness 80 y.o. F with history of thrombocytopenia, hypertension, and aortic stenosis, from home with increasing shortness of breath for a few days. Patient denied chest pain, nausea, or vomiting. In the emergency room the patient was found to have congestive heart failure and bilateral basal infiltrates worrisome for PNA, and a blood pressure on the low side . Pt with pulmonary edema and CHG.  PMH:  Pt underwent Rt. THA 09/04/13   Clinical Impression   Pt admitted with above. She demonstrates the below listed deficits and will benefit from continued OT to maximize safety and independence with BADLs.  Pt presents to OT with lethargy and generalized weakness.  She currently, requires max - total A with all BADLs, and min A +2 for functional mobility.  PTA she lived at home with son and was mod I with BADLs.  Son plans for pt to return home with him as he is able to provide 24 hour assistance.  Recommend HHOT at d/c.  02 stats 94% at rest and 91% with activity on 6L 02      Follow Up Recommendations  Home health OT;Supervision/Assistance - 24 hour    Equipment Recommendations  Tub/shower bench    Recommendations for Other Services       Precautions / Restrictions Precautions Precautions: Fall Precaution Comments: watch sats.  Pt had posterior THA 09/04/13 Restrictions Weight Bearing Restrictions: No      Mobility Bed Mobility Overal bed mobility: Needs Assistance Bed Mobility: Rolling;Sidelying to Sit Rolling: Mod assist Sidelying to sit: Mod assist       General bed mobility comments: cues for hand placement, sequence and to attend to task  Transfers Overall transfer level: Needs assistance Equipment used: Rolling walker (2 wheeled) Transfers: Sit to/from [facility] Sit to Stand: Min assist Stand pivot  transfers: Min assist       General transfer comment: cues for hand placement, safety and sequence to stand from bedx 1 and from chair x 2 with assist to control descent as pt maintaining grip on RW with descent    Balance Overall balance assessment: Needs assistance Sitting-balance support: Feet supported Sitting balance-Leahy Scale: Fair     Standing balance support: Bilateral upper extremity supported Standing balance-Leahy Scale: Poor                              ADL Overall ADL's : Needs assistance/impaired Eating/Feeding: Minimal assistance;Sitting   Grooming: Brushing hair;Wash/dry face;Moderate assistance;Sitting   Upper Body Bathing: Total assistance;Sitting   Lower Body Bathing: Total assistance;Sit to/from stand   Upper Body Dressing : Total assistance;Sitting   Lower Body Dressing: Total assistance;Sit to/from stand   Toilet Transfer: Minimal assistance;+2 for safety/equipment;Ambulation;BSC   Toileting- Clothing Manipulation and Hygiene: Total assistance;Sit to/from stand       Functional mobility during ADLs: Minimal assistance;+2 for safety/equipment;Rolling walker General ADL Comments: Pt very lethargic during eval.  Falls asleep when seated.  02 sats 94% at rest and 91% with activity on 6L 02     Vision                     Perception     Praxis      Pertinent Vitals/Pain Pain Assessment: No/denies pain     Hand Dominance Right   Extremity/Trunk Assessment Upper Extremity  Assessment Upper Extremity Assessment: Generalized weakness   Lower Extremity Assessment Lower Extremity Assessment: Defer to PT evaluation RLE Deficits / Details: hip flexion 4/5, knee extension 4/5 bil LE LLE Deficits / Details: hip flexion 4/5, knee extension 4/5 bil LE   Cervical / Trunk Assessment Cervical / Trunk Assessment: Kyphotic   Communication Communication Communication: HOH   Cognition Arousal/Alertness: Lethargic Behavior During  Therapy: Flat affect Overall Cognitive Status: Impaired/Different from baseline Area of Impairment: Orientation;Memory;Safety/judgement;Problem solving Orientation Level: Time   Memory: Decreased short-term memory   Safety/Judgement: Decreased awareness of deficits;Decreased awareness of safety   Problem Solving: Slow processing;Decreased initiation     General Comments       Exercises       Shoulder Instructions      Home Living Family/patient expects to be discharged to:: Private residence Living Arrangements: Children Available Help at Discharge: Family;Available 24 hours/day Type of Home: House Home Access: Stairs to enter [employer] of Steps: 2 Entrance Stairs-Rails: Left (Pole not rail) Home Layout: One level     Bathroom Shower/Tub: Tub/shower unit (sponge bathes at sink)   Biochemist, clinical: Standard Bathroom Accessibility: Yes How Accessible: Accessible via walker Home Equipment: [place] - 2 wheels;Bedside commode;Adaptive equipment (son wants tub bench, pt current sink-bathes) Adaptive Equipment: Reacher;Sock aid;Long-handled shoe horn;Long-handled sponge        Prior Functioning/Environment Level of Independence: Independent with assistive device(s) (set up for bathing at sink, o/w dresses self, uses RW )        Comments: RW    OT Diagnosis: Generalized weakness;Cognitive deficits   OT Problem List: Decreased strength;Decreased activity tolerance;Impaired balance (sitting and/or standing);Decreased cognition;Decreased safety awareness;Decreased knowledge of use of DME or AE;Cardiopulmonary status limiting activity   OT Treatment/Interventions: Self-care/ADL training;DME and/or AE instruction;Therapeutic activities;Cognitive remediation/compensation;Patient/family education;Balance training    OT Goals(Current goals can be found in the care plan section) Acute Rehab OT Goals Patient Stated Goal: return home and have a hot dog OT Goal  Formulation: With patient/family Time For Goal Achievement: 03/29/14 Potential to Achieve Goals: Good ADL Goals Pt Will Perform Grooming: with min guard assist;standing Pt Will Perform Upper Body Bathing: with set-up;with supervision;sitting Pt Will Perform Lower Body Bathing: with adaptive equipment;sit to/from stand;with min guard assist Pt Will Perform Upper Body Dressing: with supervision;sitting Pt Will Perform Lower Body Dressing: with adaptive equipment;sit to/from stand;with min guard assist Pt Will Transfer to Toilet: with min guard assist;ambulating;regular height toilet;bedside commode;grab bars Pt Will Perform Toileting - Clothing Manipulation and hygiene: with min guard assist;sit to/from stand  OT Frequency: Min 2X/week   Barriers to D/C:            Co-evaluation   Reason for Co-Treatment: For patient/therapist safety PT goals addressed during session: Mobility/safety with mobility OT goals addressed during session: ADL's and self-care      End of Session Equipment Utilized During Treatment: Oxygen;Rolling walker Nurse Communication: Mobility status  Activity Tolerance: Patient limited by lethargy Patient left: in chair;with call bell/phone within reach;with family/visitor present   Time: 1206-1241 OT Time Calculation (min): 35 min Charges:  OT General Charges $OT Visit: 1 Procedure OT Evaluation $Initial OT Evaluation Tier I: 1 Procedure OT Treatments $Therapeutic Activity: 8-22 mins G-Codes:    Joseh Sjogren M 2014-04-07, 1:26 PM

## 2014-03-15 NOTE — Progress Notes (Signed)
TEAM 1 - Stepdown/ICU TEAM Progress Note  Ashley Lutz:[id number] DOB: [birth year] DOA: 03/11/2014 PCP: Mathews Argyle, MD  Admit HPI / Brief Narrative: 80 y.o. F with history of thrombocytopenia, hypertension, and aortic stenosis, sent from her assisted living facility with increasing shortness of breath for a few days. Patient denied chest pain, nausea, or vomiting.   In the emergency room the patient was found to have congestive heart failure and bilateral basal infiltrates worrisome for PNA, and a blood pressure on the low side . Patient's son was in the room and gave most of the history and reported that his mother's wishes are DO NOT RESUSCITATE. The patient was placed on CPAP and felt better.  HPI/Subjective: Pt is more sleepy today. But able to say that she doesn't want to go to SNF.   Assessment/Plan:  Sepsis due to bibasilar HCAP Sepsis physiology resolved - She received antibiotics for the pneumonia. watchign her off the antibiotics.   Acute tachycardia related diastolic CHF exacerbation / AoS exacerbation Care w/ volume - will be tough to balance in setting of AoS - daily wgts and Is/Os - wgt stable . Her repeat CXR shows worsening of her heart failure and she was started on low dose lasix. Monitor electrolytes.   Newly diagnosed Afib w/ RVR  TSH normal - rate controlled - follow on tele - not a safe candidate for anticoag given advanced age and generalized weakness w/ chronic thrombocytopenia  Acute hypoxic respiratory failure  Still requiring upto5 liters of oxygen.  - cont to tx underlying issues and titrate O2 down as able .   Chronic thrombocytopenia No evidence of acute blood loss - Plt count stable -  follow trend   Mild transaminitis  Follow trend - improving - likely mild "shock liver" in setting of RVR and diminished CO as result   HTN Not presently an active issue  Code Status: DNR Family Communication: spoke w/ 2 sons at bedside    Disposition Plan:- begin PT/OT - wean O2 as able   Consultants: none  Procedures: none  Antibiotics: maxipime 10/15 >10/18 vanc 10/15 > 10/17  DVT prophylaxis: SCDs  Objective: Blood pressure 102/56, pulse 85, temperature 97.4 F (36.3 C), temperature source Oral, resp. rate 18, height 5' 3.5" (1.613 m), weight 53.6 kg (118 lb 2.7 oz), SpO2 92.00%.  Intake/Output Summary (Last 24 hours) at 03/15/14 1803 Last data filed at 03/15/14 1300  Gross per 24 hour  Intake    300 ml  Output      0 ml  Net    300 ml    Exam: General: No acute respiratory distress at rest  Lungs: bibasilar crackles - no wheeze  Cardiovascular: irreg irreg - rate controlled - prominent 3/6 holosystolic M Abdomen: Nontender, nondistended, soft, bowel sounds positive, no rebound, no ascites, no appreciable mass Extremities: No significant cyanosis, clubbing;  2+ doughy pedal edema bilateral lower extremities w/o signif change   Data Reviewed: Basic Metabolic Panel:  Recent Labs Lab 03/11/14 1832 03/12/14 0732 03/12/14 0818 03/13/14 0248 03/14/14 0430  NA 143 144 143 138 140  K 5.3 4.2 4.2 3.6* 4.3  CL 105 109 107 104 107  CO2 20 24 23 21 23   GLUCOSE 110* 84 84 75 82  BUN 34* 37* 37* 34* 21  CREATININE 0.89 0.84 0.85 0.74 0.58  CALCIUM 9.3 9.1 8.9 8.0* 8.2*  MG  --   --   --  2.1  --   PHOS  --   --   --  3.2  --     Liver Function Tests:  Recent Labs Lab 03/11/14 1832 03/12/14 0818 03/13/14 0248  AST 112* 132* 78*  ALT 96* 140* 114*  ALKPHOS 66 54 49  BILITOT 1.5* 1.0 0.9  PROT 8.8* 7.5 6.9  ALBUMIN 3.1* 2.7* 2.4*    Coags: No results found for this basename: PT, INR,  in the last 168 hours No results found for this basename: PTT,  in the last 168 hours  CBC:  Recent Labs Lab 03/11/14 1832 03/12/14 0732 03/12/14 0818 03/13/14 0248 03/14/14 0430  WBC 15.8* 12.5* 12.9* 8.9 7.4  NEUTROABS 12.3*  --  8.1*  --   --   HGB 10.9* 9.2* 9.2* 8.3* 8.8*  HCT 35.8* 31.0*  31.1* 27.8* 30.0*  MCV 90.2 90.9 91.7 91.4 92.9  PLT 41* 39* 34* 35* 43*    Cardiac Enzymes:  Recent Labs Lab 03/12/14 0152 03/12/14 0732 03/12/14 1214  TROPONINI <0.30 <0.30 <0.30   BNP (last 3 results)  Recent Labs  09/07/13 1655 03/11/14 1844  PROBNP 3126.0* 10096.0*     Recent Results (from the past 240 hour(s))  CULTURE, BLOOD (ROUTINE X 2)     Status: None   Collection Time    03/11/14  6:32 PM      Result Value Ref Range Status   Specimen Description BLOOD ARM RIGHT   Final   Special Requests BOTTLES DRAWN AEROBIC ONLY 3CC   Final   Culture  Setup Time     Final   Value: 03/12/2014 01:08     Performed at [facility]   Culture     Final   Value:        BLOOD CULTURE RECEIVED NO GROWTH TO DATE CULTURE WILL BE HELD FOR 5 DAYS BEFORE ISSUING A FINAL NEGATIVE REPORT     Performed at [facility]   Report Status PENDING   Incomplete  CULTURE, BLOOD (ROUTINE X 2)     Status: None   Collection Time    03/11/14  6:45 PM      Result Value Ref Range Status   Specimen Description BLOOD ARM RIGHT   Final   Special Requests BOTTLES DRAWN AEROBIC AND ANAEROBIC 5CC   Final   Culture  Setup Time     Final   Value: 03/12/2014 01:06     Performed at [facility]   Culture     Final   Value:        BLOOD CULTURE RECEIVED NO GROWTH TO DATE CULTURE WILL BE HELD FOR 5 DAYS BEFORE ISSUING A FINAL NEGATIVE REPORT     Performed at [facility]   Report Status PENDING   Incomplete  MRSA PCR SCREENING     Status: None   Collection Time    03/12/14 12:27 AM      Result Value Ref Range Status   MRSA by PCR NEGATIVE  NEGATIVE Final   Comment:            The GeneXpert MRSA Assay (FDA     approved for NASAL specimens     only), is one component of a     comprehensive MRSA colonization     surveillance program. It is not     intended to diagnose MRSA     infection nor to guide or     monitor treatment for     MRSA infections.      Studies:  Recent x-ray studies have been  reviewed in detail by the Attending Physician  Scheduled Meds:  Scheduled Meds: . antiseptic oral rinse  7 mL Mouth Rinse BID  . feeding supplement (ENSURE)  1 Container Oral BID BM  . furosemide  20 mg Intravenous Q12H  . Influenza vac split quadrivalent PF  0.5 mL Intramuscular Tomorrow-1000  . metoprolol tartrate  12.5 mg Oral BID  . pneumococcal 23 valent vaccine  0.5 mL Intramuscular Tomorrow-1000  . senna-docusate  2 tablet Oral Daily    Time spent on care of this patient: 36 mins   Laymon Stockert , MD   [facility] Office  [phone] Pager - Text Page per Shea Evans as per below:  On-Call/Text Page:      [email or web address]      password [email or web address]  If 7PM-7AM, please contact night-coverage [email or web address] Password [email or web address] 03/15/2014, 6:03 PM   LOS: 4 days

## 2014-03-15 NOTE — Clinical Social Work Note (Signed)
Referred to CSW today for ?SNF. Chart reviewed and have spoken with patient who states she plans to dc home at d/c- she does not want to go to SNF. She reports having a life alert button and is open to [facility] services- she states her son helps her with groceries, etc. Spoke with PT who will see her again today- RNCM updated for possible HH and DME needs. CSW to sign off- please contact us if SW needs arise. Eduard Clos, MSW, [place]

## 2014-03-16 DIAGNOSIS — I059 Rheumatic mitral valve disease, unspecified: Secondary | ICD-10-CM

## 2014-03-16 LAB — CBC
HCT: 31.8 % — ABNORMAL LOW (ref 36.0–46.0)
HEMOGLOBIN: 9.5 g/dL — AB (ref 12.0–15.0)
MCH: 27.8 pg (ref 26.0–34.0)
MCHC: 29.9 g/dL — AB (ref 30.0–36.0)
MCV: 93 fL (ref 78.0–100.0)
Platelets: 38 10*3/uL — ABNORMAL LOW (ref 150–400)
RBC: 3.42 MIL/uL — AB (ref 3.87–5.11)
RDW: 24.1 % — ABNORMAL HIGH (ref 11.5–15.5)
WBC: 7.3 10*3/uL (ref 4.0–10.5)

## 2014-03-16 LAB — BASIC METABOLIC PANEL WITH GFR
Anion gap: 9 (ref 5–15)
BUN: 17 mg/dL (ref 6–23)
CO2: 28 meq/L (ref 19–32)
Calcium: 7.9 mg/dL — ABNORMAL LOW (ref 8.4–10.5)
Chloride: 101 meq/L (ref 96–112)
Creatinine, Ser: 0.58 mg/dL (ref 0.50–1.10)
GFR calc Af Amer: 89 mL/min — ABNORMAL LOW
GFR calc non Af Amer: 77 mL/min — ABNORMAL LOW
Glucose, Bld: 87 mg/dL (ref 70–99)
Potassium: 3.5 meq/L — ABNORMAL LOW (ref 3.7–5.3)
Sodium: 138 meq/L (ref 137–147)

## 2014-03-16 NOTE — Progress Notes (Signed)
Placed 68f foley catheter per order for iv diuresis and strict i/o. Monitoring will continue.

## 2014-03-16 NOTE — Progress Notes (Signed)
UR complete.  Ramirez Fullbright RN, MSN 

## 2014-03-16 NOTE — Progress Notes (Signed)
  Echocardiogram 2D Echocardiogram has been performed.  Ashley Lutz Ashley Lutz 03/16/2014, 4:01 PM

## 2014-03-16 NOTE — Evaluation (Signed)
Clinical/Bedside Swallow Evaluation Patient Details  Name: Ashley Lutz MRN: [id number] Date of Birth: 30-Mar-1919  Today's Date: 03/16/2014 Time: 1250-1305 SLP Time Calculation (min): 15 min  Past Medical History:  Past Medical History  Diagnosis Date  . Thrombocytopenia 2010  . Hypertension   . Heart murmur   . Wrist fracture, left   . Chronic idiopathic monocytosis   . Enlargement of lymph nodes     H/O small bilateral axillary lymph nodes  . Shortness of breath     with excertion   Past Surgical History:  Past Surgical History  Procedure Laterality Date  . Hemorrhoid surgery    . Bunionectomy    . Soft tissue cyst excision      X 5 occasions--cysts on scalp  . Hip arthroplasty Right 09/04/2013    Procedure: ARTHROPLASTY BIPOLAR HIP;  Surgeon: [name], MD;  Location: [facility];  Service: Orthopedics;  Laterality: Right;   HPI:  80 y.o. F with history of thrombocytopenia, hypertension, and aortic stenosis, from home with increasing shortness of breath for a few days. Patient denied chest pain, nausea, or vomiting. In the emergency room the patient was found to have congestive heart failure and bilateral basal infiltrates worrisome for PNA, and a blood pressure on the low side . Pt with pulmonary edema and CHG    Assessment / Plan / Recommendation Clinical Impression  Pt presents with normal oropharyngeal swallow with active and effective mastication, palpable and consistent swallow response, and no overt s/s of aspiration.  There is notable eructation post-swallow.  Pt was evaluated by our services during April admission of this year, at which time she also presented with frequent belching and reported early satiety.  A basic esophageal w/u could be considered.  Otherwise, no SLP f/u is warranted.     Aspiration Risk  Mild    Diet Recommendation Regular;Thin liquid   Liquid Administration via: Cup;Straw Medication Administration: Whole meds with  liquid Supervision: Patient able to self feed Compensations: Follow solids with liquid Postural Changes and/or Swallow Maneuvers: Seated upright 90 degrees;Upright 30-60 min after meal    Other  Recommendations Recommended Consults: Consider esophageal assessment Oral Care Recommendations: Oral care BID   Follow Up Recommendations  None        Swallow Study Prior Functional Status       General Date of Onset: 03/16/14 HPI: 80 y.o. F with history of thrombocytopenia, hypertension, and aortic stenosis, from home with increasing shortness of breath for a few days. Patient denied chest pain, nausea, or vomiting. In the emergency room the patient was found to have congestive heart failure and bilateral basal infiltrates worrisome for PNA, and a blood pressure on the low side . Pt with pulmonary edema and CHG  Type of Study: Bedside swallow evaluation Previous Swallow Assessment: 4/15 - results no oropharyngeal dysphagia but recs for esophageal w/u Diet Prior to this Study: Regular;Thin liquids Temperature Spikes Noted: No Respiratory Status: Nasal cannula Behavior/Cognition: Alert;Cooperative;Hard of hearing Oral Cavity - Dentition: Poor condition Self-Feeding Abilities: Able to feed self Patient Positioning: Upright in bed Baseline Vocal Quality: Clear Volitional Cough: Strong Volitional Swallow: Able to elicit    Oral/Motor/Sensory Function Overall Oral Motor/Sensory Function: Appears within functional limits for tasks assessed   Ice Chips Ice chips: Within functional limits Presentation: Self Fed   Thin Liquid Thin Liquid: Within functional limits Presentation: Cup    Nectar Thick Nectar Thick Liquid: Not tested   Honey Thick Honey Thick Liquid: Not tested  Puree Puree: Within functional limits   Solid Shanyn Preisler L. [place], [place] CCC/SLP Pager [phone]     Solid: Within functional limits Presentation: Self Fed       [name], Estill Bamberg [name] 03/16/2014,1:26 PM

## 2014-03-16 NOTE — Care Management Note (Signed)
    Page 1 of 2   03/18/2014     3:11:58 PM CARE MANAGEMENT NOTE 03/18/2014  Patient:  Ashley Lutz, Ashley Lutz   Account Number:  [id number]  Date Initiated:  03/12/2014  Documentation initiated by:  Marvetta Gibbons  Subjective/Objective Assessment:   Pt admitted with acute resp. failure, PNA, sepsis     Action/Plan:   PTA pt lived at home with son, NCM to follow for d/c needs   Anticipated DC Date:  03/19/2014   Anticipated DC Plan:  [place]  CM consult      PAC Choice  HOSPICE   Choice offered to / List presented to:  C-4 Adult Children   DME arranged  OXYGEN  SHOWER STOOL      DME agency  [place] arranged  HH-1 RN  [place] agency  [facility]   Status of service:  Completed, signed off Medicare Important Message given?  YES (If response is "NO", the following Medicare IM given date fields will be blank) Date Medicare IM given:  03/15/2014 Medicare IM given by:  AMERSON,JULIE Date Additional Medicare IM given:  03/18/2014 Additional Medicare IM given by:  Ellan Lambert  Discharge Disposition:  [place]  Per UR Regulation:  Reviewed for med. necessity/level of care/duration of stay  If discussed at [place] of Stay Meetings, dates discussed:   03/16/2014  03/18/2014    Comments:  03/18/14 Ellan Lambert, RN, BSN [phone] Palliative Care meeting this am at 9:30 with sons and pt, to determine goals of care.  They desire pt to go home with Hospice services.  Met with sons later in the morning; they would like services to be provided by Hospice and [place].  The only DME needs, per sons are the oxygen and a shower chair.  They deny need for hospital bed or bedside table.  They have BSC and RW at home.  Pt will need portable O2 tank for transport home, as sons wish to take pt by car, not ambulance upon dc.   Margie with [facility] to arrange DME and portable tank for home with [facility].  DC planned for 10/23.  03/15/14 Ellan Lambert, RN, BSN [phone] Pt with slow progression; met with 2 sons at bedside to discuss dc planning.  Pt resided with one son at home prior to admission, and pt/family wish to return home at discharge. PT/OT recommending home with home health at discharge.   Past history of homecare with [place], and family wishes to continue with this agency.    Pt currently on 6L/nasal oxygen, and may need at discharge. Will follow as pt progresses towards discharge.  Will need orders for HH/home oxygen, if needed.

## 2014-03-16 NOTE — Progress Notes (Addendum)
Progress Note  Ashley Lutz SEG:[id number] DOB: 04/15/1919 DOA: 03/11/2014  PCP: Mathews Argyle, MD  Brief Narrative: 80 y.o. F with history of thrombocytopenia, hypertension, and aortic stenosis, sent from her assisted living facility with increasing shortness of breath for a few days. Patient denied chest pain, nausea, or vomiting. In the emergency room the patient was found to have congestive heart failure and bilateral basal infiltrates worrisome for PNA, and a blood pressure on the low side.   Subjective: She wants to go home. Denies shortness of breath or chest pain.   Assessment/Plan:  Presumed Sepsis/Abnormal CXR Findings in CXR likely due to edema rather than pneumonia. Currently off of antibiotics.   Acute Diastolic CHF exacerbation / Severe Aortic Stenosis On low dose Lasix. Patient seem stable. O2 down to 5 lpm. Will be tough to balance in setting of AS. Daily wgts and Is/Os . Her repeat CXR shows worsening of her heart failure. Echocardiogram as below. Critical AS noted. Discussed with Dr. Einar Gip today. Patient is not candidate for any intervention. He recommends Hospice. Family agreeable. Cosnult PMT.   Newly diagnosed Afib w/ RVR  TSH normal. Rate controlled. Not a safe candidate for anticoag given chronic thrombocytopenia and other debility.  Acute hypoxic respiratory failure  Still requiring up to 5 liters of oxygen. Continue to treat underlying issues and titrate O2 down as able.  Chronic thrombocytopenia No evidence of acute blood loss - Plt count stable -  follow trend   Mild transaminitis  Follow trend - improving - likely mild "shock liver" in setting of RVR and CHF.    Essential HTN Not presently an active issue  Code Status: DNR Family Communication: none at bedside  Disposition Plan: Await PMT input. Son wishes to take her home. Is likely a hospice candidate. Possible DC in 1-2  days.  Consultants: Cardiology.  Procedures: Echocardiogram Study Conclusions - Left ventricle: The cavity size was normal. Wall thickness was increased in a pattern of moderate LVH. Systolic function was normal. The estimated ejection fraction was in the range of 55% to 60%. - Aortic valve: There was critical stenosis. Valve area (VTI): 0.29 cm^2. Valve area (Vmax): 0.29 cm^2. Valve area (Vmean): 0.29 cm^2. - Mitral valve: Calcified annulus. There was mild to moderate regurgitation. Valve area by continuity equation (using LVOT flow): 0.61 cm^2. - Left atrium: The atrium was moderately to severely dilated. - Right atrium: The atrium was mildly dilated. - Atrial septum: Probable PFO - Tricuspid valve: There was mild-moderate regurgitation. - Pulmonary arteries: PA peak pressure: 52 mm Hg (S). - Pericardium, extracardiac: A trivial pericardial effusion was identified. There was a left pleural effusion.  Antibiotics: maxipime 10/15 >10/18 vanc 10/15 > 10/17  DVT prophylaxis: SCDs  Objective: Blood pressure 92/44, pulse 90, temperature 98.4 F (36.9 C), temperature source Oral, resp. rate 16, height 5' 3.5" (1.613 m), weight 52.2 kg (115 lb 1.3 oz), SpO2 97.00%.  Intake/Output Summary (Last 24 hours) at 03/16/14 1701 Last data filed at 03/16/14 1300  Gross per 24 hour  Intake    200 ml  Output    300 ml  Net   -100 ml    Exam: General: Frail appearing. In no acute distress.  Lungs: bibasilar crackles - no wheezing  Cardiovascular: irreg irreg - rate controlled - prominent 5/6 holosystolic Murmur Abdomen: Nontender, nondistended, soft, bowel sounds positive, no rebound, no ascites, no appreciable mass Extremities: No significant cyanosis, clubbing;  2+ pedal edema bilateral lower extremities w/o signif change  Data Reviewed: Basic Metabolic Panel:  Recent Labs Lab 03/12/14 0732 03/12/14 0818 03/13/14 0248 03/14/14 0430 03/16/14 0434  NA 144 143 138 140 138  K 4.2  4.2 3.6* 4.3 3.5*  CL 109 107 104 107 101  CO2 24 23 21 23 28   GLUCOSE 84 84 75 82 87  BUN 37* 37* 34* 21 17  CREATININE 0.84 0.85 0.74 0.58 0.58  CALCIUM 9.1 8.9 8.0* 8.2* 7.9*  MG  --   --  2.1  --   --   PHOS  --   --  3.2  --   --     Liver Function Tests:  Recent Labs Lab 03/11/14 1832 03/12/14 0818 03/13/14 0248  AST 112* 132* 78*  ALT 96* 140* 114*  ALKPHOS 66 54 49  BILITOT 1.5* 1.0 0.9  PROT 8.8* 7.5 6.9  ALBUMIN 3.1* 2.7* 2.4*    CBC:  Recent Labs Lab 03/11/14 1832 03/12/14 0732 03/12/14 0818 03/13/14 0248 03/14/14 0430 03/16/14 0434  WBC 15.8* 12.5* 12.9* 8.9 7.4 7.3  NEUTROABS 12.3*  --  8.1*  --   --   --   HGB 10.9* 9.2* 9.2* 8.3* 8.8* 9.5*  HCT 35.8* 31.0* 31.1* 27.8* 30.0* 31.8*  MCV 90.2 90.9 91.7 91.4 92.9 93.0  PLT 41* 39* 34* 35* 43* 38*    Cardiac Enzymes:  Recent Labs Lab 03/12/14 0152 03/12/14 0732 03/12/14 1214  TROPONINI <0.30 <0.30 <0.30   BNP (last 3 results)  Recent Labs  09/07/13 1655 03/11/14 1844  PROBNP 3126.0* 10096.0*     Recent Results (from the past 240 hour(s))  CULTURE, BLOOD (ROUTINE X 2)     Status: None   Collection Time    03/11/14  6:32 PM      Result Value Ref Range Status   Specimen Description BLOOD ARM RIGHT   Final   Special Requests BOTTLES DRAWN AEROBIC ONLY 3CC   Final   Culture  Setup Time     Final   Value: 03/12/2014 01:08     Performed at [facility]   Culture     Final   Value:        BLOOD CULTURE RECEIVED NO GROWTH TO DATE CULTURE WILL BE HELD FOR 5 DAYS BEFORE ISSUING A FINAL NEGATIVE REPORT     Performed at [facility]   Report Status PENDING   Incomplete  CULTURE, BLOOD (ROUTINE X 2)     Status: None   Collection Time    03/11/14  6:45 PM      Result Value Ref Range Status   Specimen Description BLOOD ARM RIGHT   Final   Special Requests BOTTLES DRAWN AEROBIC AND ANAEROBIC 5CC   Final   Culture  Setup Time     Final   Value: 03/12/2014 01:06     Performed  at [facility]   Culture     Final   Value:        BLOOD CULTURE RECEIVED NO GROWTH TO DATE CULTURE WILL BE HELD FOR 5 DAYS BEFORE ISSUING A FINAL NEGATIVE REPORT     Performed at [facility]   Report Status PENDING   Incomplete  MRSA PCR SCREENING     Status: None   Collection Time    03/12/14 12:27 AM      Result Value Ref Range Status   MRSA by PCR NEGATIVE  NEGATIVE Final   Comment:  The GeneXpert MRSA Assay (FDA     approved for NASAL specimens     only), is one component of a     comprehensive MRSA colonization     surveillance program. It is not     intended to diagnose MRSA     infection nor to guide or     monitor treatment for     MRSA infections.     Scheduled Meds:  Scheduled Meds: . antiseptic oral rinse  7 mL Mouth Rinse BID  . feeding supplement (ENSURE)  1 Container Oral BID BM  . furosemide  20 mg Intravenous Q12H  . Influenza vac split quadrivalent PF  0.5 mL Intramuscular Tomorrow-1000  . metoprolol tartrate  12.5 mg Oral BID  . pneumococcal 23 valent vaccine  0.5 mL Intramuscular Tomorrow-1000  . senna-docusate  2 tablet Oral Daily    Time spent on care of this patient: 35 mins  Dr. Hosie Poisson MD    [facility]  [phone]  If 7PM-7AM, please contact night-coverage. [email or web address] Password [email or web address]  03/16/2014, 5:01 PM   LOS: 5 days    PLEASE NOTE THAT THIS PROGRESS NOTE WAS INADVERTENTLY ALTERED ON 10/21 BY Noriah Osgood. THE NOTE WAS ORIGINALLY WRITTEN BY DR. Karleen Hampshire.  Bonnielee Haff

## 2014-03-17 DIAGNOSIS — I5033 Acute on chronic diastolic (congestive) heart failure: Secondary | ICD-10-CM | POA: Diagnosis not present

## 2014-03-17 NOTE — Progress Notes (Signed)
I have met with Ms. Episcopo and her son at bedside. Her other son whom she lives with has gone to work. I have scheduled to meet with all of them tomorrow 10/22 0930 am for goals of care and home options. Thank you for this consult.  Vinie Sill, NP Palliative Medicine Team Pager # [phone] (M-F 8a-5p) Team Phone # [phone] (Nights/Weekends)

## 2014-03-17 NOTE — Progress Notes (Signed)
Physical Therapy Treatment Patient Details Name: Ashley Lutz MRN: [id number] DOB: 01/19/1919 Today's Date: 03/17/2014    History of Present Illness 80 y.o. F with history of thrombocytopenia, hypertension, and aortic stenosis, from home with increasing shortness of breath for a few days. Patient denied chest pain, nausea, or vomiting. In the emergency room the patient was found to have congestive heart failure and bilateral basal infiltrates worrisome for PNA, and a blood pressure on the low side . Pt with pulmonary edema and CHG.  PMH:  Pt underwent Rt. THA 09/04/13    PT Comments    Patient is progressing towards physical therapy goals, ambulating up to 45 feet with min assist today. Tolerated transfer training and is at a min assist level with this task as well. Pt tolerated titration of supplemental O2 from 5L to 4L with SpO2 holding at 97% throughout therapy session. Patient will continue to benefit from skilled physical therapy services to further improve independence with functional mobility.  Follow Up Recommendations  Home health PT;Supervision/Assistance - 24 hour     Equipment Recommendations  None recommended by PT    Recommendations for Other Services       Precautions / Restrictions Precautions Precautions: Fall Precaution Comments: watch sats.  Pt had posterior THA 09/04/13 Restrictions Weight Bearing Restrictions: No    Mobility  Bed Mobility Overal bed mobility: Needs Assistance Bed Mobility: Sit to Supine       Sit to supine: Min guard   General bed mobility comments: Min guard for safety. VC for technique. No physical assist required.  Transfers Overall transfer level: Needs assistance Equipment used: Pushed w/c;1 person hand held assist Transfers: Sit to/from Stand Sit to Stand: Min assist         General transfer comment: Min assist for balance with hand held assist to wheelchair. VC for hand placement. Required several attempts and to rock  forward for momentum, slow to rise. Cues for upright posture once standing. Impulsive to sit due to fatigue, with poor control onto bed. VC for safety and walker placement.  Ambulation/Gait Ambulation/Gait assistance: Min assist Ambulation Distance (Feet): 45 Feet Assistive device:  (Standing behind and Pushing wheelchair) Gait Pattern/deviations: Step-through pattern;Decreased stride length;Shuffle;Trunk flexed;Narrow base of support   Gait velocity interpretation: Below normal speed for age/gender General Gait Details: VC for upright posture and forward gaze, very flexed posture throughout. Small shuffled steps. Min assist for wheelchair control while patient was pushing from behind.   Stairs            Wheelchair Mobility    Modified Rankin (Stroke Patients Only)       Balance                                    Cognition Arousal/Alertness: Awake/alert Behavior During Therapy: WFL for tasks assessed/performed Overall Cognitive Status: Within Functional Limits for tasks assessed                      Exercises      General Comments General comments (skin integrity, edema, etc.): SpO2 maintained 97% throughout therapy session while on 4L supplemental O2.      Pertinent Vitals/Pain Pain Assessment: 0-10 Pain Score:  ("Just my hip when I walk" No value given) Pain Location: Rt hip (THA on Rt 09/04/13) Pain Intervention(s): Monitored during session;Repositioned    Home Living  Prior Function            PT Goals (current goals can now be found in the care plan section) Progress towards PT goals: Progressing toward goals    Frequency  Min 3X/week    PT Plan Current plan remains appropriate    Co-evaluation             End of Session Equipment Utilized During Treatment: Gait belt;Oxygen (4L supplemental O2) Activity Tolerance: Patient limited by fatigue Patient left: in bed;with call bell/phone within  reach;with family/visitor present     Time: 7544-9201 PT Time Calculation (min): 18 min  Charges:  $Therapeutic Activity: 8-22 mins                    G Codes:      Elayne Snare, [place]  Ellouise Newer 03/17/2014, 3:16 PM

## 2014-03-17 NOTE — Progress Notes (Signed)
Occupational Therapy Treatment Patient Details Name: Ashley Lutz MRN: [id number] DOB: 25-Mar-1919 Today's Date: 03/17/2014    History of present illness 80 y.o. F with history of thrombocytopenia, hypertension, and aortic stenosis, from home with increasing shortness of breath for a few days. Patient denied chest pain, nausea, or vomiting. In the emergency room the patient was found to have congestive heart failure and bilateral basal infiltrates worrisome for PNA, and a blood pressure on the low side . Pt with pulmonary edema and CHG.  PMH:  Pt underwent Rt. THA 09/04/13   OT comments  Pt fatigues quickly. Only agreeable to functional transfers in room.  Requires increased time and rest breaks.  02 sats 98% on 5L and HR 113     Home health OT;Supervision/Assistance - 24 hour    Equipment Recommendations  Tub/shower bench    Recommendations for Other Services      Precautions / Restrictions Precautions Precautions: Fall Precaution Comments: watch sats.  Pt had posterior THA 09/04/13       Mobility Bed Mobility               General bed mobility comments: Pt sitting in recliner  Transfers Overall transfer level: Needs assistance Equipment used: Rolling walker (2 wheeled) Transfers: Sit to/from [facility] Sit to Stand: Mod assist Stand pivot transfers: Mod assist       General transfer comment: Pt with posterior lean.   Requires assist to power up     Balance Overall balance assessment: Needs assistance Sitting-balance support: Feet supported Sitting balance-Leahy Scale: Fair     Standing balance support: Bilateral upper extremity supported Standing balance-Leahy Scale: Poor                     ADL                           Toilet Transfer: Moderate assistance;Ambulation;BSC   Toileting- Clothing Manipulation and Hygiene: Total assistance;Sit to/from stand         General ADL Comments: Pt only agreeable to  functional transfers and mobility in the room       Vision                     Perception     Praxis      Cognition   Behavior During Therapy: Flat affect Overall Cognitive Status: Impaired/Different from baseline                       Extremity/Trunk Assessment               Exercises     Shoulder Instructions       General Comments      Pertinent Vitals/ Pain       Pain Assessment: Faces Faces Pain Scale: Hurts little more Pain Location: bottom from sitting in chair.  Pillows placed in chair.  Pain Intervention(s): Monitored during session  Home Living                                          Prior Functioning/Environment              Frequency Min 2X/week     Progress Toward Goals  OT Goals(current goals can now be found in the care plan section)  Progress  towards OT goals: Not progressing toward goals - comment (due to fatigue)  ADL Goals Pt Will Perform Grooming: with min guard assist;standing Pt Will Perform Upper Body Bathing: with set-up;with supervision;sitting Pt Will Perform Lower Body Bathing: with adaptive equipment;sit to/from stand;with min guard assist Pt Will Perform Upper Body Dressing: with supervision;sitting Pt Will Perform Lower Body Dressing: with adaptive equipment;sit to/from stand;with min guard assist Pt Will Transfer to Toilet: with min guard assist;ambulating;regular height toilet;bedside commode;grab bars Pt Will Perform Toileting - Clothing Manipulation and hygiene: with min guard assist;sit to/from stand  Plan Discharge plan remains appropriate    Co-evaluation                 End of Session Equipment Utilized During Treatment: Oxygen;Rolling walker   Activity Tolerance Patient limited by lethargy   Patient Left in chair;with call bell/phone within reach;with family/visitor present   Nurse Communication Mobility status        Time: 8850-2774 OT Time Calculation  (min): 20 min  Charges: OT General Charges $OT Visit: 1 Procedure OT Treatments $Therapeutic Activity: 8-22 mins  Mycala Warshawsky M 03/17/2014, 11:57 AM

## 2014-03-17 NOTE — Consult Note (Signed)
CARDIOLOGY CONSULT NOTE  Patient ID: Ashley Lutz MRN: [id number] DOB/AGE: [birth year] 78 y.o.  Admit date: 03/11/2014 Referring Physician  Hosie Poisson, MD Primary Physician:  Mathews Argyle, MD Reason for Consultation  CHF, Aortic stenosis  HPI: Patient is a 80 year old Caucasian female who I had seen about 3-4 months ago when she presented with hip fracture, at that time she was found to have critical aortic stenosis.  She was recommended to have outpatient follow-up for consideration for endovascular replacement of the aortic valve.  She never followed up in the outpatient basis.  She is now admitted with congestive heart failure and pulmonary edema and new onset of atrial fibrillation.  I was asked to point on her further management due to worsening pulmonary edema and congestive heart failure.  Patient fairly active previous to recent hospitalization when she presented with hip fracture.  She is presently living at home with her son.  She has limited activities in the house, but able to take care of herself and able to dress herself, bathe herself and eat.  Over the past one week due to worsening shortness of breath, she eventually presented to the emergency room, and was found to have pulmonary edema.  Patient is presently complaining of being in the hospital and states that her son does not left her as he presented in the hospital.  Otherwise history is very limited, states that she is doing well and does not appreciate the fact that she has been pushed to do physical activity and physical therapy, states that she would like to just sit down and relax and rest.  Past Medical History  Diagnosis Date  . Thrombocytopenia 2010  . Hypertension   . Heart murmur   . Wrist fracture, left   . Chronic idiopathic monocytosis   . Enlargement of lymph nodes     H/O small bilateral axillary lymph nodes  . Shortness of breath     with excertion     Past Surgical History  Procedure  Laterality Date  . Hemorrhoid surgery    . Bunionectomy    . Soft tissue cyst excision      X 5 occasions--cysts on scalp  . Hip arthroplasty Right 09/04/2013    Procedure: ARTHROPLASTY BIPOLAR HIP;  Surgeon: [name], MD;  Location: [facility];  Service: Orthopedics;  Laterality: Right;     History reviewed. No pertinent family history.   Social History: History   Social History  . Marital Status: Widowed    Spouse Name: N/A    Number of Children: N/A  . Years of Education: N/A   Occupational History  . Not on file.   Social History Main Topics  . Smoking status: Never Smoker   . Smokeless tobacco: Not on file  . Alcohol Use: No  . Drug Use: No  . Sexual Activity: Not on file   Other Topics Concern  . Not on file   Social History Narrative  . No narrative on file     Prescriptions prior to admission  Medication Sig Dispense Refill  . acetaminophen (TYLENOL) 325 MG tablet Take 2 tablets (650 mg total) by mouth every 6 (six) hours as needed for mild pain (or Fever >/= 101).      Marland Kitchen amLODipine (NORVASC) 5 MG tablet Take 5 mg by mouth daily.       . calcium-vitamin D (OSCAL WITH D) 250-125 MG-UNIT per tablet Take 1 tablet by mouth daily.      . Multiple  Vitamins-Minerals (CENTRUM SILVER PO) Take 1 tablet by mouth daily.      . ramipril (ALTACE) 10 MG capsule Take 10 mg by mouth daily.         Scheduled Meds: . antiseptic oral rinse  7 mL Mouth Rinse BID  . feeding supplement (ENSURE)  1 Container Oral BID BM  . furosemide  20 mg Intravenous Q12H  . Influenza vac split quadrivalent PF  0.5 mL Intramuscular Tomorrow-1000  . metoprolol tartrate  12.5 mg Oral BID  . pneumococcal 23 valent vaccine  0.5 mL Intramuscular Tomorrow-1000  . senna-docusate  2 tablet Oral Daily   Continuous Infusions:  PRN Meds:.acetaminophen, acetaminophen, bisacodyl, HYDROcodone-acetaminophen, menthol-cetylpyridinium, ondansetron (ZOFRAN) IV, ondansetron, phenol, zolpidem  ROS: Difficult  to get all history, but does c/o cough and fatigue. No chest pain or palpitations    Physical Exam: Blood pressure 112/67, pulse 97, temperature 98 F (36.7 C), temperature source Oral, resp. rate 17, height 5' 3.5" (1.613 m), weight 52.3 kg (115 lb 4.8 oz), SpO2 97.00%.   General appearance: alert, cooperative, appears stated age, no distress, pale and Frail Lungs: bilateral basis show  bronchial breath sounds, occasional scattered rhonchi heard bilaterally, diffuse. Heart: S1 is variable, S2 is muffled, 3/ 6 crescendo decrescendo murmur heard in the right sternal border and also in the apex, conducted to the carotids. Abdomen: soft, non-tender; bowel sounds normal; no masses,  no organomegaly Extremities: extremities normal, atraumatic, no cyanosis or edema Neurologic: Grossly normal  Labs:   Lab Results  Component Value Date   WBC 7.3 03/16/2014   HGB 9.5* 03/16/2014   HCT 31.8* 03/16/2014   MCV 93.0 03/16/2014   PLT 38* 03/16/2014    Recent Labs Lab 03/13/14 0248  03/16/14 0434  NA 138  < > 138  K 3.6*  < > 3.5*  CL 104  < > 101  CO2 21  < > 28  BUN 34*  < > 17  CREATININE 0.74  < > 0.58  CALCIUM 8.0*  < > 7.9*  PROT 6.9  --   --   BILITOT 0.9  --   --   ALKPHOS 49  --   --   ALT 114*  --   --   AST 78*  --   --   GLUCOSE 75  < > 87  < > = values in this interval not displayed. Lab Results  Component Value Date   TROPONINI <0.30 03/12/2014    Lipid Panel  No results found for this basename: chol, trig, hdl, cholhdl, vldl, ldlcalc    EKG 03/11/2014: Atrial fibrillation with rapid ventricular response, nonspecific T abnormality, baseline artifact.    ASSESSMENT AND PLAN:  1.  Critical aortic stenosis, echocardiogram report evaluated. 2.  Pulmonary edema and acute diastolic heart failure secondary to critical aortic stenosis and atrial fibrillation, new onset. 3.  New onset atrial fibrillation with rapid ventricular response, heart rate much better. 4.   Chronic thrombus opinion, failure to thrive in an adult, gait imbalance.  Recommendation: Patient is extremely frail and her survival with CHF and critical AS is very poor and long-term prognosis is guarded and would recommend Hospice and palliative care only.   I have discussed the endovascular aortic valve replacement with the patient's son, after discussing the risks, benefits, we do agree that conservative therapies is the best route, patient will not be able to survive any kind of procedures without causing significant complications.  I do agree with Dr. Karleen Hampshire that  patient is not a candidate for anticoagulation due to fall risk, thrombocytopenia and chronic anemia. I have discussed my opinion with Dr. Maryland Pink. Patient has good support system at home.  If A. FIb rate control is an issue, can potentially consider Digoxin in view of borderline BP.    Laverda Page, MD 03/17/2014, 8:53 AM [facility] [place] Pager: [phone] Office: [phone] If no answer Cell [phone]

## 2014-03-17 NOTE — Progress Notes (Signed)
Progress Note  Ashley Lutz VQQ:[id number] DOB: [birth year] DOA: 03/11/2014  PCP: Mathews Argyle, MD  Brief Narrative: 80 y.o. F with history of thrombocytopenia, hypertension, and aortic stenosis, sent from her assisted living facility with increasing shortness of breath for a few days. Patient denied chest pain, nausea, or vomiting. In the emergency room the patient was found to have congestive heart failure and bilateral basal infiltrates worrisome for PNA, and a blood pressure on the low side.   Subjective: She wants to go home. Denies shortness of breath or chest pain.   Assessment/Plan:  Presumed Sepsis/Abnormal CXR Findings in CXR likely due to edema rather than pneumonia. Currently off of antibiotics.   Acute Diastolic CHF exacerbation / Severe Aortic Stenosis On low dose Lasix. Patient seem stable. O2 down to 5 lpm. Will be tough to balance in setting of AS. Daily wgts and Is/Os . Her repeat CXR shows worsening of her heart failure. Echocardiogram as below. Critical AS noted. Discussed with Dr. Einar Gip today. Patient is not candidate for any intervention. He recommends Hospice. Family agreeable. Cosnult PMT.   Newly diagnosed Afib w/ RVR  TSH normal. Rate controlled. Not a safe candidate for anticoag given chronic thrombocytopenia and other debility.  Acute hypoxic respiratory failure  Still requiring up to 5 liters of oxygen. Continue to treat underlying issues and titrate O2 down as able.  Chronic thrombocytopenia No evidence of acute blood loss - Plt count stable -  follow trend   Mild transaminitis  Follow trend - improving - likely mild "shock liver" in setting of RVR and CHF.    Essential HTN Not presently an active issue  Code Status: DNR Family Communication: none at bedside  Disposition Plan: Await PMT input. Son wishes to take her home. Is likely a hospice candidate. Possible DC in 1-2  days.  Consultants: Cardiology.  Procedures: Echocardiogram Study Conclusions - Left ventricle: The cavity size was normal. Wall thickness was increased in a pattern of moderate LVH. Systolic function was normal. The estimated ejection fraction was in the range of 55% to 60%. - Aortic valve: There was critical stenosis. Valve area (VTI): 0.29 cm^2. Valve area (Vmax): 0.29 cm^2. Valve area (Vmean): 0.29 cm^2. - Mitral valve: Calcified annulus. There was mild to moderate regurgitation. Valve area by continuity equation (using LVOT flow): 0.61 cm^2. - Left atrium: The atrium was moderately to severely dilated. - Right atrium: The atrium was mildly dilated. - Atrial septum: Probable PFO - Tricuspid valve: There was mild-moderate regurgitation. - Pulmonary arteries: PA peak pressure: 52 mm Hg (S). - Pericardium, extracardiac: A trivial pericardial effusion was identified. There was a left pleural effusion.  Antibiotics: maxipime 10/15 >10/18 vanc 10/15 > 10/17  DVT prophylaxis: SCDs  Objective: Blood pressure 91/55, pulse 90, temperature 98 F (36.7 C), temperature source Oral, resp. rate 17, height 5' 3.5" (1.613 m), weight 52.3 kg (115 lb 4.8 oz), SpO2 97.00%.  Intake/Output Summary (Last 24 hours) at 03/17/14 1417 Last data filed at 03/17/14 0900  Gross per 24 hour  Intake    480 ml  Output   2100 ml  Net  -1620 ml    Exam: General: Frail appearing. In no acute distress.  Lungs: bibasilar crackles - no wheezing  Cardiovascular: irreg irreg - rate controlled - prominent 5/6 holosystolic Murmur Abdomen: Nontender, nondistended, soft, bowel sounds positive, no rebound, no ascites, no appreciable mass Extremities: No significant cyanosis, clubbing;  2+ pedal edema bilateral lower extremities w/o signif change   Data  Reviewed: Basic Metabolic Panel:  Recent Labs Lab 03/12/14 0732 03/12/14 0818 03/13/14 0248 03/14/14 0430 03/16/14 0434  NA 144 143 138 140 138  K 4.2 4.2  3.6* 4.3 3.5*  CL 109 107 104 107 101  CO2 24 23 21 23 28   GLUCOSE 84 84 75 82 87  BUN 37* 37* 34* 21 17  CREATININE 0.84 0.85 0.74 0.58 0.58  CALCIUM 9.1 8.9 8.0* 8.2* 7.9*  MG  --   --  2.1  --   --   PHOS  --   --  3.2  --   --     Liver Function Tests:  Recent Labs Lab 03/11/14 1832 03/12/14 0818 03/13/14 0248  AST 112* 132* 78*  ALT 96* 140* 114*  ALKPHOS 66 54 49  BILITOT 1.5* 1.0 0.9  PROT 8.8* 7.5 6.9  ALBUMIN 3.1* 2.7* 2.4*    CBC:  Recent Labs Lab 03/11/14 1832 03/12/14 0732 03/12/14 0818 03/13/14 0248 03/14/14 0430 03/16/14 0434  WBC 15.8* 12.5* 12.9* 8.9 7.4 7.3  NEUTROABS 12.3*  --  8.1*  --   --   --   HGB 10.9* 9.2* 9.2* 8.3* 8.8* 9.5*  HCT 35.8* 31.0* 31.1* 27.8* 30.0* 31.8*  MCV 90.2 90.9 91.7 91.4 92.9 93.0  PLT 41* 39* 34* 35* 43* 38*    Cardiac Enzymes:  Recent Labs Lab 03/12/14 0152 03/12/14 0732 03/12/14 1214  TROPONINI <0.30 <0.30 <0.30   BNP (last 3 results)  Recent Labs  09/07/13 1655 03/11/14 1844  PROBNP 3126.0* 10096.0*     Recent Results (from the past 240 hour(s))  CULTURE, BLOOD (ROUTINE X 2)     Status: None   Collection Time    03/11/14  6:32 PM      Result Value Ref Range Status   Specimen Description BLOOD ARM RIGHT   Final   Special Requests BOTTLES DRAWN AEROBIC ONLY 3CC   Final   Culture  Setup Time     Final   Value: 03/12/2014 01:08     Performed at [facility]   Culture     Final   Value:        BLOOD CULTURE RECEIVED NO GROWTH TO DATE CULTURE WILL BE HELD FOR 5 DAYS BEFORE ISSUING A FINAL NEGATIVE REPORT     Performed at [facility]   Report Status PENDING   Incomplete  CULTURE, BLOOD (ROUTINE X 2)     Status: None   Collection Time    03/11/14  6:45 PM      Result Value Ref Range Status   Specimen Description BLOOD ARM RIGHT   Final   Special Requests BOTTLES DRAWN AEROBIC AND ANAEROBIC 5CC   Final   Culture  Setup Time     Final   Value: 03/12/2014 01:06     Performed at  [facility]   Culture     Final   Value:        BLOOD CULTURE RECEIVED NO GROWTH TO DATE CULTURE WILL BE HELD FOR 5 DAYS BEFORE ISSUING A FINAL NEGATIVE REPORT     Performed at [facility]   Report Status PENDING   Incomplete  MRSA PCR SCREENING     Status: None   Collection Time    03/12/14 12:27 AM      Result Value Ref Range Status   MRSA by PCR NEGATIVE  NEGATIVE Final   Comment:  The GeneXpert MRSA Assay (FDA     approved for NASAL specimens     only), is one component of a     comprehensive MRSA colonization     surveillance program. It is not     intended to diagnose MRSA     infection nor to guide or     monitor treatment for     MRSA infections.     Scheduled Meds:  Scheduled Meds: . antiseptic oral rinse  7 mL Mouth Rinse BID  . feeding supplement (ENSURE)  1 Container Oral BID BM  . furosemide  20 mg Intravenous Q12H  . Influenza vac split quadrivalent PF  0.5 mL Intramuscular Tomorrow-1000  . metoprolol tartrate  12.5 mg Oral BID  . pneumococcal 23 valent vaccine  0.5 mL Intramuscular Tomorrow-1000  . senna-docusate  2 tablet Oral Daily    Time spent on care of this patient: 35 mins  Shanikia Kernodle MD  [phone]  [facility] Office  [phone]  If 7PM-7AM, please contact night-coverage. [email or web address] Password [email or web address]  03/17/2014, 2:17 PM   LOS: 6 days

## 2014-03-17 NOTE — Progress Notes (Signed)
Pt blood pressure is 91/55 with a pulse of 90. Pt has ordered Lopressor and Lasix.  MD contacted.  MD wants to hold Lopressor and give Lasix.  Will continue to monitor vital signs.  Johnson,Sydny Schnitzler A 03/17/2014

## 2014-03-18 DIAGNOSIS — Z515 Encounter for palliative care: Secondary | ICD-10-CM

## 2014-03-18 DIAGNOSIS — R0602 Shortness of breath: Secondary | ICD-10-CM

## 2014-03-18 LAB — CULTURE, BLOOD (ROUTINE X 2)
Culture: NO GROWTH
Culture: NO GROWTH

## 2014-03-18 MED ORDER — FUROSEMIDE 10 MG/ML IJ SOLN
20.0000 mg | Freq: Two times a day (BID) | INTRAMUSCULAR | Status: AC
  Administered 2014-03-18: 20 mg via INTRAVENOUS

## 2014-03-18 MED ORDER — FUROSEMIDE 20 MG PO TABS
20.0000 mg | ORAL_TABLET | Freq: Two times a day (BID) | ORAL | Status: DC
  Administered 2014-03-18 – 2014-03-19 (×2): 20 mg via ORAL
  Filled 2014-03-18 (×4): qty 1

## 2014-03-18 MED ORDER — MORPHINE SULFATE (CONCENTRATE) 10 MG /0.5 ML PO SOLN: 5.0000 mg | ORAL | Status: DC | PRN

## 2014-03-18 NOTE — Progress Notes (Signed)
bp low, made Dr. Maryland Pink aware, orders to hold am metoprolol and give lasix. Monitoring will continue.

## 2014-03-18 NOTE — Progress Notes (Signed)
Notified by Sandra Cockayne, patient and family request services of [facility]) after discharge.  [facility] RN Newman Nickels met with pt and son Claiborne Billings at bedside to initiate education related to hospice services, philosophy and team approach to care; son voiced they are familiar with [facility] services as [facility] cared for their father in the past.   Per discussion plan is to d/c by personal vehicle tomorrow *Please send completed GOLD DNR Form home with pt  DME: [facility] RN contacted equipment manager Gayland Curry to request [facility] deliver  1) O2 package B currently on O2 @ [facility] continuous - pt will need to have travel O2 tank brought to the room for discharge transport - [place] notified to request tank brought to the room; pt has a BSC and walker in the home - son declined a hospital bed or transport chair at this time he has list of equipment and aware additional needs can be addressed once home  Initial paperwork faxed to [place]  Please notify [facility] when patient is ready to leave unit at d/c call [phone] (or [phone] if after 5 pm);  [facility] information and contact numbers also given to son Claiborne Billings  during visit.   Above information shared with [facility] Please call with any questions or concerns   Danton Sewer, RN 03/18/2014, 1:13 PM [facility] Liaison [phone]

## 2014-03-18 NOTE — Progress Notes (Signed)
Patient Ashley Lutz      DOB: February 17, 1919      UQJ:[id number]     Consult Note from the Palliative Medicine Team at [facility] Requested by: Dr. Maryland Pink     PCP: Mathews Argyle, MD Reason for Consultation: Hospice options    Phone Number:[phone]  Assessment of patients Current state: I met with Ashley Lutz and her sons, Yvone Neu and Claiborne Billings, today. She slept the majority of the conversation and is very hard of hearing. They both tell me that she wants to go home and that they want her to be able to stay home. They assure me that one of them will be with here 24/7. Yvone Neu tells me that she never wanted to come to the hospital in the first place but he did not know what else to do. We discussed hospice philosophy and services and they agree this is the best decision for her leaving the hospital. We completed MOST: DNR, comfort measures with no hospital return, determine use/limitation of antibiotics, no IV fluids, and no feeding tube. They wish for her to be comfortable and live out whatever time she has left at home.    Goals of Care: 1.  Code Status: DNR   2. Scope of Treatment: Proceed home tomorrow with comfort care and no return to the hospital.    4. Disposition: Home with hospice.    3. Symptom Management:   1. Pain: Vicodin 5-325 mg every 6 hours prn.  2. Shortness of breath: Continue oxygen therapy as needed and Lasix. Roxanol 5 mg every 2 hours prn.  3. Bowel Regimen: Senokot-S 2 tablets daily. Dulcolax supp daily prn.  4. Sleep: Ambien 5 mg qhs prn.  5. Weakness: Continue medical management. PT/OT following while hospitalized.   4. Psychosocial: Emotional support provided to patient and family.     Patient Documents Completed or Given: Document Given Completed  Advanced Directives Pkt    MOST    DNR    Gone from My Sight    Hard Choices      Brief HPI: 80 yo female admitted with respiratory distress r/t critical aortic stenosis with new CHF  and bilateral pneumonia. PMH significant for shortness of breath with exertion, heart murmur, hypertension, thrombocytopenia, left wrist fracture, chronic idiopathic monocytosis, enlarged axillary lymph nodes with recent repair of right hip fracture and critical aortic stenosis ~3-4 months ago.    ROS: Denies pain, anxiety, shortness of breath, constipation.     PMH:  Past Medical History  Diagnosis Date  . Thrombocytopenia 2010  . Hypertension   . Heart murmur   . Wrist fracture, left   . Chronic idiopathic monocytosis   . Enlargement of lymph nodes     H/O small bilateral axillary lymph nodes  . Shortness of breath     with excertion     PSH: Past Surgical History  Procedure Laterality Date  . Hemorrhoid surgery    . Bunionectomy    . Soft tissue cyst excision      X 5 occasions--cysts on scalp  . Hip arthroplasty Right 09/04/2013    Procedure: ARTHROPLASTY BIPOLAR HIP;  Surgeon: [name], MD;  Location: [place];  Service: Orthopedics;  Laterality: Right;   I have reviewed the [place] and SH and  If appropriate update it with new information. Allergies  Allergen Reactions  . Milk-Related Compounds Other (See Comments)    Lactose intolerant   Scheduled Meds: . antiseptic oral rinse  7 mL Mouth Rinse BID  . feeding supplement (ENSURE)  1 Container Oral BID BM  . furosemide  20 mg Intravenous Q12H  . metoprolol tartrate  12.5 mg Oral BID  . senna-docusate  2 tablet Oral Daily   Continuous Infusions:  PRN Meds:.acetaminophen, acetaminophen, bisacodyl, HYDROcodone-acetaminophen, menthol-cetylpyridinium, ondansetron (ZOFRAN) IV, ondansetron, phenol, zolpidem    BP 108/67  Pulse 99  Temp(Src) 98.6 F (37 C) (Oral)  Resp 21  Ht 5' 3.5" (1.613 m)  Wt 47.3 kg (104 lb 4.4 oz)  BMI 18.18 kg/m2  SpO2 96%   PPS: 30%   Intake/Output Summary (Last 24 hours) at 03/18/14 1026 Last data filed at 03/18/14 0800  Gross per 24 hour  Intake    360 ml  Output   1400 ml   Net  -1040 ml   LBM: 10/18                         Physical Exam:  General: NAD, thin, frail, pleasant HEENT: [place]/AT, no JVD, moist mucous membranes Chest: Bibasilar crackles, no labored breathing, symmetric CVS: Irreg - atrial fib Abdomen: Soft, NT, ND, +BS Ext: MAE, bilat feet 2+ edema, warm to touch Neuro: Awake, alert, oriented x 3  Labs: CBC    Component Value Date/Time   WBC 7.3 03/16/2014 0434   WBC 7.8 12/28/2013 1521   RBC 3.42* 03/16/2014 0434   RBC 3.59* 12/28/2013 1521   HGB 9.5* 03/16/2014 0434   HGB 9.9* 12/28/2013 1521   HCT 31.8* 03/16/2014 0434   HCT 32.1* 12/28/2013 1521   PLT 38* 03/16/2014 0434   PLT 47* 12/28/2013 1521   MCV 93.0 03/16/2014 0434   MCV 89.4 12/28/2013 1521   MCH 27.8 03/16/2014 0434   MCH 27.6 12/28/2013 1521   MCHC 29.9* 03/16/2014 0434   MCHC 30.8* 12/28/2013 1521   RDW 24.1* 03/16/2014 0434   RDW 18.3* 12/28/2013 1521   LYMPHSABS 1.6 03/12/2014 0818   LYMPHSABS 1.8 12/28/2013 1521   MONOABS 3.2* 03/12/2014 0818   MONOABS 2.1* 12/28/2013 1521   EOSABS 0.0 03/12/2014 0818   EOSABS 0.1 12/28/2013 1521   BASOSABS 0.0 03/12/2014 0818   BASOSABS 0.1 12/28/2013 1521    BMET    Component Value Date/Time   NA 138 03/16/2014 0434   K 3.5* 03/16/2014 0434   CL 101 03/16/2014 0434   CO2 28 03/16/2014 0434   GLUCOSE 87 03/16/2014 0434   BUN 17 03/16/2014 0434   CREATININE 0.58 03/16/2014 0434   CALCIUM 7.9* 03/16/2014 0434   GFRNONAA 77* 03/16/2014 0434   GFRAA 89* 03/16/2014 0434    CMP     Component Value Date/Time   NA 138 03/16/2014 0434   K 3.5* 03/16/2014 0434   CL 101 03/16/2014 0434   CO2 28 03/16/2014 0434   GLUCOSE 87 03/16/2014 0434   BUN 17 03/16/2014 0434   CREATININE 0.58 03/16/2014 0434   CALCIUM 7.9* 03/16/2014 0434   PROT 6.9 03/13/2014 0248   ALBUMIN 2.4* 03/13/2014 0248   AST 78* 03/13/2014 0248   ALT 114* 03/13/2014 0248   ALKPHOS 49 03/13/2014 0248   BILITOT 0.9 03/13/2014 0248   GFRNONAA 77* 03/16/2014 0434   GFRAA  89* 03/16/2014 0434     Time In Time Out Total Time Spent with Patient Total Overall Time  0920 1040 39mn 870m    Greater than 50%  of this time was spent counseling and coordinating care related to the above  assessment and plan.  Vinie Sill, NP Palliative Medicine Team Pager # [phone] (M-F 8a-5p) Team Phone # [phone] (Nights/Weekends)

## 2014-03-18 NOTE — Progress Notes (Signed)
Progress Note  Ashley Lutz PFX:[id number] DOB: Oct 10, 1918 DOA: 03/11/2014  PCP: Mathews Argyle, MD  Brief Narrative: 80 y.o. F with history of thrombocytopenia, hypertension, and aortic stenosis, sent from her assisted living facility with increasing shortness of breath for a few days. Patient denied chest pain, nausea, or vomiting. In the emergency room the patient was found to have congestive heart failure and bilateral basal infiltrates worrisome for PNA, and a blood pressure on the low side. Patient was admitted and started on treatment for CHF.  Subjective: She looks comfortable. Denies any pain. Denies shortness of breath.   Assessment/Plan:  Acute Diastolic CHF exacerbation / Severe Aortic Stenosis On low dose Lasix. Patient seem stable. Will change to oral lasix. O2 down to 5 lpm. Her repeat CXR shows worsening of her heart failure. Echocardiogram as below. Critical AS noted. Discussed with Dr. Einar Gip. Patient is not candidate for any intervention. He recommended Hospice. Family agreeable. PMT was consulted. Family agreeable to comfort measures and home with hospice.  Presumed Sepsis/Abnormal CXR Findings in CXR likely due to edema rather than pneumonia. Currently off of antibiotics.   Newly diagnosed Afib w/ RVR  TSH normal. Rate controlled. Not a safe candidate for anticoag given chronic thrombocytopenia and other debility.  Acute hypoxic respiratory failure  Continue to treat underlying issues and titrate O2 down as able. Will need to go home with O2.  Chronic thrombocytopenia No evidence of acute blood loss - Plt count stable -  follow trend   Mild transaminitis  Follow trend - improving - likely mild "shock liver" in setting of RVR and CHF.    Essential HTN Not presently an active issue  Code Status: DNR Family Communication: none at bedside  Disposition Plan: Home with hospice in AM.   Consultants: Cardiology.  Procedures: Echocardiogram Study  Conclusions - Left ventricle: The cavity size was normal. Wall thickness was increased in a pattern of moderate LVH. Systolic function was normal. The estimated ejection fraction was in the range of 55% to 60%. - Aortic valve: There was critical stenosis. Valve area (VTI): 0.29 cm^2. Valve area (Vmax): 0.29 cm^2. Valve area (Vmean): 0.29 cm^2. - Mitral valve: Calcified annulus. There was mild to moderate regurgitation. Valve area by continuity equation (using LVOT flow): 0.61 cm^2. - Left atrium: The atrium was moderately to severely dilated. - Right atrium: The atrium was mildly dilated. - Atrial septum: Probable PFO - Tricuspid valve: There was mild-moderate regurgitation. - Pulmonary arteries: PA peak pressure: 52 mm Hg (S). - Pericardium, extracardiac: A trivial pericardial effusion was identified. There was a left pleural effusion.  Antibiotics: maxipime 10/15 >10/18 vanc 10/15 > 10/17  DVT prophylaxis: SCDs  Objective: Blood pressure 97/56, pulse 92, temperature 98.6 F (37 C), temperature source Oral, resp. rate 21, height 5' 3.5" (1.613 m), weight 47.3 kg (104 lb 4.4 oz), SpO2 96.00%.  Intake/Output Summary (Last 24 hours) at 03/18/14 1216 Last data filed at 03/18/14 0800  Gross per 24 hour  Intake    360 ml  Output   1400 ml  Net  -1040 ml    Exam: General: Frail appearing. In no acute distress.  Lungs: bibasilar crackles - no wheezing  Cardiovascular: irreg irreg - rate controlled - prominent 5/6 holosystolic Murmur Abdomen: Nontender, nondistended, soft, bowel sounds positive, no rebound, no ascites, no appreciable mass Extremities:  1+ pedal edema bilateral lower extremities   Data Reviewed: Basic Metabolic Panel:  Recent Labs Lab 03/12/14 0732 03/12/14 0818 03/13/14 0248 03/14/14 0430  03/16/14 0434  NA 144 143 138 140 138  K 4.2 4.2 3.6* 4.3 3.5*  CL 109 107 104 107 101  CO2 24 23 21 23 28   GLUCOSE 84 84 75 82 87  BUN 37* 37* 34* 21 17  CREATININE  0.84 0.85 0.74 0.58 0.58  CALCIUM 9.1 8.9 8.0* 8.2* 7.9*  MG  --   --  2.1  --   --   PHOS  --   --  3.2  --   --     Liver Function Tests:  Recent Labs Lab 03/11/14 1832 03/12/14 0818 03/13/14 0248  AST 112* 132* 78*  ALT 96* 140* 114*  ALKPHOS 66 54 49  BILITOT 1.5* 1.0 0.9  PROT 8.8* 7.5 6.9  ALBUMIN 3.1* 2.7* 2.4*    CBC:  Recent Labs Lab 03/11/14 1832 03/12/14 0732 03/12/14 0818 03/13/14 0248 03/14/14 0430 03/16/14 0434  WBC 15.8* 12.5* 12.9* 8.9 7.4 7.3  NEUTROABS 12.3*  --  8.1*  --   --   --   HGB 10.9* 9.2* 9.2* 8.3* 8.8* 9.5*  HCT 35.8* 31.0* 31.1* 27.8* 30.0* 31.8*  MCV 90.2 90.9 91.7 91.4 92.9 93.0  PLT 41* 39* 34* 35* 43* 38*    Cardiac Enzymes:  Recent Labs Lab 03/12/14 0152 03/12/14 0732 03/12/14 1214  TROPONINI <0.30 <0.30 <0.30   BNP (last 3 results)  Recent Labs  09/07/13 1655 03/11/14 1844  PROBNP 3126.0* 10096.0*     Recent Results (from the past 240 hour(s))  CULTURE, BLOOD (ROUTINE X 2)     Status: None   Collection Time    03/11/14  6:32 PM      Result Value Ref Range Status   Specimen Description BLOOD ARM RIGHT   Final   Special Requests BOTTLES DRAWN AEROBIC ONLY 3CC   Final   Culture  Setup Time     Final   Value: 03/12/2014 01:08     Performed at [facility]   Culture     Final   Value: NO GROWTH 5 DAYS     Performed at [facility]   Report Status 03/18/2014 FINAL   Final  CULTURE, BLOOD (ROUTINE X 2)     Status: None   Collection Time    03/11/14  6:45 PM      Result Value Ref Range Status   Specimen Description BLOOD ARM RIGHT   Final   Special Requests BOTTLES DRAWN AEROBIC AND ANAEROBIC 5CC   Final   Culture  Setup Time     Final   Value: 03/12/2014 01:06     Performed at [facility]   Culture     Final   Value: NO GROWTH 5 DAYS     Performed at [facility]   Report Status 03/18/2014 FINAL   Final  MRSA PCR SCREENING     Status: None   Collection Time    03/12/14  12:27 AM      Result Value Ref Range Status   MRSA by PCR NEGATIVE  NEGATIVE Final   Comment:            The GeneXpert MRSA Assay (FDA     approved for NASAL specimens     only), is one component of a     comprehensive MRSA colonization     surveillance program. It is not     intended to diagnose MRSA     infection nor to guide or  monitor treatment for     MRSA infections.     Scheduled Meds:  Scheduled Meds: . antiseptic oral rinse  7 mL Mouth Rinse BID  . feeding supplement (ENSURE)  1 Container Oral BID BM  . furosemide  20 mg Intravenous Q12H  . metoprolol tartrate  12.5 mg Oral BID  . senna-docusate  2 tablet Oral Daily    Time spent on care of this patient: 35 mins  Wenda Vanschaick MD  [phone]  [facility] Office  [phone]  If 7PM-7AM, please contact night-coverage. [email or web address] Password [email or web address]  03/18/2014, 12:16 PM   LOS: 7 days

## 2014-03-19 MED ORDER — MORPHINE SULFATE (CONCENTRATE) 10 MG /0.5 ML PO SOLN: 5.0000 mg | ORAL | Status: AC | PRN

## 2014-03-19 MED ORDER — METOPROLOL TARTRATE 12.5 MG HALF TABLET: 12.5000 mg | ORAL_TABLET | Freq: Two times a day (BID) | ORAL | Status: AC

## 2014-03-19 MED ORDER — FUROSEMIDE 20 MG PO TABS: 20.0000 mg | ORAL_TABLET | Freq: Two times a day (BID) | ORAL | Status: AC

## 2014-03-19 NOTE — Discharge Summary (Signed)
[facility]  Physician Discharge Summary   Patient ID: Ashley Lutz MRN: [id number] DOB/AGE: [birth year] 80 y.o.  Admit date: 03/11/2014 Discharge date: 03/19/2014  PCP: Mathews Argyle, MD  DISCHARGE DIAGNOSES:  Active Problems:   Pulmonary edema   Respiratory failure   Severe protein-energy malnutrition   Palliative care encounter   RECOMMENDATIONS FOR OUTPATIENT FOLLOW UP: 1. Hospice at home.  DISCHARGE CONDITION: poor  Diet recommendation: Regular as tolerated  Filed Weights   03/17/14 0450 03/18/14 0401 03/19/14 0534  Weight: 52.3 kg (115 lb 4.8 oz) 47.3 kg (104 lb 4.4 oz) 49 kg (108 lb 0.4 oz)    INITIAL HISTORY: 80 y.o. F with history of thrombocytopenia, hypertension and aortic stenosis, sent from her assisted living facility with increasing shortness of breath for a few days. Patient denied chest pain, nausea, or vomiting. In the emergency room the patient was found to have congestive heart failure and bilateral basal infiltrates worrisome for PNA, and a blood pressure on the low side. Patient was admitted and started on treatment for CHF.  Consultations:  Cardiology, Dr. Einar Gip  Procedures: Echocardiogram  Study Conclusions - Left ventricle: The cavity size was normal. Wall thickness was increased in a pattern of moderate LVH. Systolic function was normal. The estimated ejection fraction was in the range of 55% to 60%. - Aortic valve: There was critical stenosis. Valve area (VTI): 0.29 cm^2. Valve area (Vmax): 0.29 cm^2. Valve area (Vmean): 0.29 cm^2. - Mitral valve: Calcified annulus. There was mild to moderate regurgitation. Valve area by continuity equation (using LVOT flow): 0.61 cm^2. - Left atrium: The atrium was moderately to severely dilated. - Right atrium: The atrium was mildly dilated. - Atrial septum: Probable PFO - Tricuspid valve: There was mild-moderate regurgitation. - Pulmonary arteries: PA peak pressure: 52 mm Hg (S). -  Pericardium, extracardiac: A trivial pericardial effusion was identified. There was a left pleural effusion.  HOSPITAL COURSE:   Acute Diastolic CHF exacerbation / Acute Pulmonary edema secondary to Severe Aortic Stenosis  She was started on low dose IV Lasix. It was felt that her severe aortic stenosis was contributing to her symptoms. Cardiology was consulted and she was not felt to be a candidate for any intervention. Hospice was recommended. Palliative medicine was consulted and family was agreeable. Plan is to continue low dose lasix orally along with low dose metoprolol. Oxygen at home. Son wants to take her home. This will be arranged today.   Presumed Sepsis/Abnormal CXR  Findings in CXR likely due to edema rather than pneumonia. She was initially given antibiotics which were then stopped.  Newly diagnosed Afib w/ RVR  TSH normal. Rate controlled. Not a safe candidate for anticoag given chronic thrombocytopenia and other debility. Low dose metoprolol.  Acute hypoxic respiratory failure  Secondary to pulmonary edema. See above. Will need to go home with O2.   Chronic thrombocytopenia  No evidence of acute blood loss - Plt count stable.  Mild transaminitis  Improved. This was likely mild "shock liver" in setting of RVR and CHF.   Essential HTN  BP runs low at times.   Code Status: DNR  Patient has poor prognosis with life expectancy less than 6 months possibly just a few weeks. She will be discharged home with hospice.   PERTINENT LABS:  The results of significant diagnostics from this hospitalization (including imaging, microbiology, ancillary and laboratory) are listed below for reference.    Microbiology: Recent Results (from the past 240 hour(s))  CULTURE, BLOOD (ROUTINE  X 2)     Status: None   Collection Time    03/11/14  6:32 PM      Result Value Ref Range Status   Specimen Description BLOOD ARM RIGHT   Final   Special Requests BOTTLES DRAWN AEROBIC ONLY 3CC    Final   Culture  Setup Time     Final   Value: 03/12/2014 01:08     Performed at [facility]   Culture     Final   Value: NO GROWTH 5 DAYS     Performed at [facility]   Report Status 03/18/2014 FINAL   Final  CULTURE, BLOOD (ROUTINE X 2)     Status: None   Collection Time    03/11/14  6:45 PM      Result Value Ref Range Status   Specimen Description BLOOD ARM RIGHT   Final   Special Requests BOTTLES DRAWN AEROBIC AND ANAEROBIC 5CC   Final   Culture  Setup Time     Final   Value: 03/12/2014 01:06     Performed at [facility]   Culture     Final   Value: NO GROWTH 5 DAYS     Performed at [facility]   Report Status 03/18/2014 FINAL   Final  MRSA PCR SCREENING     Status: None   Collection Time    03/12/14 12:27 AM      Result Value Ref Range Status   MRSA by PCR NEGATIVE  NEGATIVE Final   Comment:            The GeneXpert MRSA Assay (FDA     approved for NASAL specimens     only), is one component of a     comprehensive MRSA colonization     surveillance program. It is not     intended to diagnose MRSA     infection nor to guide or     monitor treatment for     MRSA infections.     Labs: Basic Metabolic Panel:  Recent Labs Lab 03/13/14 0248 03/14/14 0430 03/16/14 0434  NA 138 140 138  K 3.6* 4.3 3.5*  CL 104 107 101  CO2 21 23 28   GLUCOSE 75 82 87  BUN 34* 21 17  CREATININE 0.74 0.58 0.58  CALCIUM 8.0* 8.2* 7.9*  MG 2.1  --   --   PHOS 3.2  --   --    Liver Function Tests:  Recent Labs Lab 03/13/14 0248  AST 78*  ALT 114*  ALKPHOS 49  BILITOT 0.9  PROT 6.9  ALBUMIN 2.4*   CBC:  Recent Labs Lab 03/13/14 0248 03/14/14 0430 03/16/14 0434  WBC 8.9 7.4 7.3  HGB 8.3* 8.8* 9.5*  HCT 27.8* 30.0* 31.8*  MCV 91.4 92.9 93.0  PLT 35* 43* 38*   Cardiac Enzymes:  Recent Labs Lab 03/12/14 1214  TROPONINI <0.30   BNP: BNP (last 3 results)  Recent Labs  09/07/13 1655 03/11/14 1844  PROBNP 3126.0*  10096.0*    IMAGING STUDIES Dg Chest Port 1 View  03/15/2014   CLINICAL DATA:  Short of breath  EXAM: PORTABLE CHEST - 1 VIEW  COMPARISON:  03/13/2014  FINDINGS: Progression of diffuse bilateral airspace disease consistent with pulmonary edema. Moderate bilateral pleural effusions also have increased. Compressive atelectasis in both lung bases has progressed.  IMPRESSION: Worsening congestive heart failure with increasing edema, pleural effusions, and atelectasis bilaterally.   Electronically Signed  By: Franchot Gallo M.D.   On: 03/15/2014 08:32   Dg Chest Port 1 View  03/13/2014   CLINICAL DATA:  80 year old with pneumonia.  EXAM: PORTABLE CHEST - 1 VIEW  COMPARISON:  03/11/2014  FINDINGS: Portable view of the chest again demonstrates bilateral pleural effusions and bilateral central airspace disease. Negative for a pneumothorax. The thoracic aorta is heavily calcified.  IMPRESSION: Persistent bilateral pleural effusions which are moderate in size.  Central airspace disease is most compatible with pulmonary edema. Infection cannot be excluded.   Electronically Signed   By: Markus Daft M.D.   On: 03/13/2014 08:25   Dg Chest Portable 1 View  03/11/2014   CLINICAL DATA:  Respiratory distress for 1 day.  Initial encounter.  EXAM: PORTABLE CHEST - 1 VIEW  COMPARISON:  09/06/2013; 09/03/2013; 11/05/2011  FINDINGS: Grossly unchanged enlarged cardiac silhouette and mediastinal contours with atherosclerotic plaque within the thoracic aorta. The pulmonary vasculature is indistinct with cephalization of flow. Interval development small bilateral effusions and associated bibasilar heterogeneous/ consolidative opacities, left greater than right. No pneumothorax. Unchanged bones.  IMPRESSION: Cardiomegaly and atherosclerosis with findings of pulmonary edema and small bilateral effusions with associated bibasilar opacities, left greater than right, atelectasis versus infiltrate.   Electronically Signed   By: Sandi Mariscal M.D.   On: 03/11/2014 19:26    DISCHARGE EXAMINATION: Filed Vitals:   03/18/14 1354 03/18/14 2144 03/18/14 2145 03/19/14 0534  BP: 93/44 87/44 104/57 102/61  Pulse: 89 92  96  Temp: 98.6 F (37 C) 97.8 F (36.6 C)  98.1 F (36.7 C)  TempSrc: Oral Oral  Oral  Resp: 17 18  18   Height:      Weight:    49 kg (108 lb 0.4 oz)  SpO2: 99% 99%  97%   General appearance: distracted, fatigued and no distress Resp: crackles bilaterally at bases. Cardio: regular rate and rhythm, S1, S2 normal, no murmur, click, rub or gallop GI: soft, non-tender; bowel sounds normal; no masses,  no organomegaly  DISPOSITION: Home with hopsice  Discharge Instructions   Activity as tolerated - No restrictions    Complete by:  As directed      Diet general    Complete by:  As directed      Discharge instructions    Complete by:  As directed   Call hospice if patient becomes more short of breath or has chest pain.           ALLERGIES:  Allergies  Allergen Reactions  . Milk-Related Compounds Other (See Comments)    Lactose intolerant    Current Discharge Medication List    START taking these medications   Details  furosemide (LASIX) 20 MG tablet Take 1 tablet (20 mg total) by mouth 2 (two) times daily. Qty: 60 tablet, Refills: 2    metoprolol tartrate (LOPRESSOR) 12.5 mg TABS tablet Take 0.5 tablets (12.5 mg total) by mouth 2 (two) times daily. Qty: 30 tablet, Refills: 2    Morphine Sulfate (MORPHINE CONCENTRATE) 10 mg / 0.5 ml concentrated solution Take 0.25 mLs (5 mg total) by mouth every 2 (two) hours as needed for severe pain or shortness of breath. Qty: 30 mL, Refills: 0      CONTINUE these medications which have NOT CHANGED   Details  acetaminophen (TYLENOL) 325 MG tablet Take 2 tablets (650 mg total) by mouth every 6 (six) hours as needed for mild pain (or Fever >/= 101).    calcium-vitamin D (OSCAL WITH D)  250-125 MG-UNIT per tablet Take 1 tablet by mouth daily.   Associated  Diagnoses: Thrombocytopenia    Multiple Vitamins-Minerals (CENTRUM SILVER PO) Take 1 tablet by mouth daily.      STOP taking these medications     amLODipine (NORVASC) 5 MG tablet      ramipril (ALTACE) 10 MG capsule      bisacodyl (DULCOLAX) 10 MG suppository      feeding supplement, ENSURE, (ENSURE) PUDG      menthol-cetylpyridinium (CEPACOL) 3 MG lozenge      phenol (CHLORASEPTIC) 1.4 % LIQD      sennosides-docusate sodium (SENOKOT-S) 8.6-50 MG tablet        Follow-up Information   Call Mathews Argyle, MD. (As needed, If symptoms worsen)    Specialty:  Internal Medicine   Contact information:   [address] [place] [address] [phone]       TOTAL DISCHARGE TIME: 35 mins  [facility] Pager [phone]  03/19/2014, 8:41 AM

## 2014-03-19 NOTE — Progress Notes (Signed)
PT Cancellation Note  Patient Details Name: Ashley Lutz MRN: [id number] DOB: Oct 23, 1918   Cancelled Treatment:    Reason Eval/Treat Not Completed: Patient declined, no reason specified Pt and son report she plans to leave hospital shortly with hospice care at home. Politely decline any physical therapy services at this time.   Ashley Lutz, Ashley Lutz   Ashley Lutz 03/19/2014, 11:58 AM

## 2014-03-19 NOTE — Progress Notes (Signed)
Pt's son given discharge instructions and verbalized understanding. Pt escorted out via wheelchair by myself and Benjaman Pott, RN. Pt VSS ; no change in assessment noted.

## 2014-03-19 NOTE — Discharge Instructions (Signed)
Hospice Hospice is a service that is designed to provide people who are terminally ill and their families with medical, spiritual, and psychological support. Its aim is to improve your quality of life by keeping you as alert and comfortable as possible. Hospice is performed by a team of health care professionals and volunteers who:  Help keep you comfortable. Hospice can be provided in your home or in a homelike setting. The hospice staff works with your family and friends to help meet your needs. You will enjoy the support of loved ones by receiving much of your basic care from family and friends.  Provide pain relief and manage your symptoms. The staff supply all necessary medicines and equipment.  Provide companionship when you are alone.  Allow you and your family to rest. They may do light housekeeping, prepare meals, and run errands.  Provide counseling. They will make sure your emotional, spiritual, and social needs and those of your family are being met.  Provide spiritual care. Spiritual care is individualized to meet your needs and your family's needs. It may involve helping you look at what death means to you, say goodbye, or perform a specific religious ceremony or ritual. Hospice teams often include:  A nurse.  A doctor.  Social workers.  Religious leaders (such as a Clinical biochemist).  Trained volunteers. WHEN SHOULD HOSPICE CARE BEGIN? Most people who use hospice are believed to have fewer than 6 months to live. Your family and health care providers can help you decide when hospice services should begin. If your condition improves, you may discontinue the program. WHAT [place]? Most hospice programs are run by nonprofit, independent organizations. Some are affiliated with hospitals, nursing homes, or home health care agencies. Hospice programs can take place in the home or at a hospice center, hospital, or skilled nursing facility. When choosing  a hospice program, ask the following questions:  What services are available to me?  What services are offered to my loved ones?  How involved are my loved ones?  How involved is my health care provider?  Who makes up the hospice care team? How are they trained or screened?  How will my pain and symptoms be managed?  If my circumstances change, can the services be provided in a different setting, such as my home or in the hospital?  Is the program reviewed and licensed by the state or certified in some other way? WHERE CAN I LEARN MORE ABOUT HOSPICE? You can learn about existing hospice programs in your area from your health care providers. You can also read more about hospice online. The websites of the following organizations contain helpful information:  The [facility] and Palliative Care Organization [facility]).  The Hospice Association of America ([place]).  The [place].  The American Cancer Society (ACS).  [facility]. Document Released: 08/31/2003 Document Revised: 05/19/2013 Document Reviewed: 03/24/2013 [facility] Patient Information 2015 [place], [place]. This information is not intended to replace advice given to you by your health care provider. Make sure you discuss any questions you have with your health care provider.   Aortic Stenosis Aortic stenosis is a narrowing of the aortic valve. The aortic valve is a gatelike structure that is located between the lower left chamber of the heart (left ventricle) and the blood vessel that leads away from the heart (aorta). When the aortic valve is narrowed, it does not open all the way. This makes it hard for the heart to pump  blood into the aorta and causes the heart to work harder. The extra work can weaken the heart over time and lead to heart failure. CAUSES  Causes of aortic stenosis include:  Calcium deposits on the aortic valve that have made the valve stiff. This condition generally affects those over  the age of 82. It is the most common cause of aortic stenosis.  A birth defect.  Rheumatic fever. This is a problem that may occur after a strep throat infection that was not treated adequately. Rheumatic fever can cause permanent damage to heart valves. SIGNS AND SYMPTOMS  People with aortic stenosis usually have no symptoms until the condition becomes severe. It may take 10-20 years for mild or moderate aortic stenosis to become severe. Symptoms may include:   Shortness of breath, especially with physical activity.   Feeling weak and tired (fatigued) or getting tired easily.  Chest discomfort (angina). This may occur with minimal activity if the aortic stenosis is severe.  An irregular or faster-than-normal heartbeat.  Dizziness or fainting that happens with exertion or after taking certain heart medicines (such as nitroglycerin). DIAGNOSIS  Aortic stenosis is usually diagnosed with a physical exam and with a type of imaging test called echocardiography. During echocardiography, sound waves are used to evaluate how blood flows through the heart. If your health care provider suspects aortic stenosis but the test does not clearly show it, a procedure called cardiac catheterization may be done to diagnose the condition. Tests may also be done to evaluate heart function. They may include:  Electrocardiography. During this test, the electrical impulses of the heart are recorded while you are lying down and sticky patches are placed on your chest, arms, and legs.  Stress tests. There is more than one type of stress test. If a stress test is needed, ask your health care provider about which type is best for you.  Blood tests. TREATMENT  Treatment depends on how severe the aortic stenosis is, your symptoms, and the problems it is causing.   Observation. If the aortic stenosis is mild, no treatment may be needed. However, you will need to have the condition checked regularly to make sure it  is not getting worse or causing serious problems.  Surgery. Surgery to repair or replace the aortic valve is the most common treatment for aortic stenosis. Several types of surgeries are available. The most common are open-heart surgery and transcutaneous aortic valve replacement (TAVR). TAVR does not require that the chest be opened. It is usually performed on elderly patients and those who are not able to have open-heart surgery.  Medicines. Medicines may be given to keep symptoms from getting worse. Medicines cannot reverse aortic stenosis. HOME CARE INSTRUCTIONS   You may need to avoid certain types of physical activity. If your aortic stenosis is mild, you may need to avoid only strenuous activity. The more severe your aortic stenosis, the more activities you will need to avoid. Talk with your health care provider about the types of activity you should avoid.  Take medicines only as directed by your health care provider.  If you are a woman with aortic valve stenosis and want to get pregnant, talk to your health care provider before you become pregnant.  If you are a woman with aortic valve stenosis and are pregnant, keep all follow-up visits with all recommended health care providers.  Keep all follow-up visits for tests, exams, and treatments as directed by your health care provider. SEEK IMMEDIATE MEDICAL CARE IF:  You develop chest pain or tightness.   You develop shortness of breath or difficulty breathing.   You develop light-headedness or faint.   It feels like your heartbeat is irregular or faster than normal.  You have a fever. Document Released: 02/10/2003 Document Revised: 09/28/2013 Document Reviewed: 05/08/2012 [facility] Patient Information 2015 [place], [place]. This information is not intended to replace advice given to you by your health care provider. Make sure you discuss any questions you have with your health care provider.

## 2014-06-10 ENCOUNTER — Encounter (HOSPITAL_COMMUNITY): Payer: Self-pay | Admitting: Orthopedic Surgery

## 2014-08-27 DEATH — deceased
